# Patient Record
Sex: Female | Born: 1955
Health system: Southern US, Community
[De-identification: ages and names within clinical notes are randomized; demographics above are authoritative.]

## PROBLEM LIST (undated history)

## (undated) DIAGNOSIS — G4730 Sleep apnea, unspecified: Secondary | ICD-10-CM

## (undated) DIAGNOSIS — E079 Disorder of thyroid, unspecified: Secondary | ICD-10-CM

## (undated) DIAGNOSIS — E669 Obesity, unspecified: Secondary | ICD-10-CM

## (undated) DIAGNOSIS — I10 Essential (primary) hypertension: Secondary | ICD-10-CM

## (undated) DIAGNOSIS — E039 Hypothyroidism, unspecified: Secondary | ICD-10-CM

## (undated) DIAGNOSIS — F319 Bipolar disorder, unspecified: Secondary | ICD-10-CM

## (undated) DIAGNOSIS — M51369 Other intervertebral disc degeneration, lumbar region without mention of lumbar back pain or lower extremity pain: Secondary | ICD-10-CM

## (undated) DIAGNOSIS — R000 Tachycardia, unspecified: Secondary | ICD-10-CM

## (undated) DIAGNOSIS — I350 Nonrheumatic aortic (valve) stenosis: Secondary | ICD-10-CM

## (undated) DIAGNOSIS — R011 Cardiac murmur, unspecified: Secondary | ICD-10-CM

## (undated) DIAGNOSIS — M5136 Other intervertebral disc degeneration, lumbar region: Secondary | ICD-10-CM

## (undated) DIAGNOSIS — F338 Other recurrent depressive disorders: Secondary | ICD-10-CM

## (undated) DIAGNOSIS — E119 Type 2 diabetes mellitus without complications: Secondary | ICD-10-CM

## (undated) DIAGNOSIS — I639 Cerebral infarction, unspecified: Secondary | ICD-10-CM

## (undated) DIAGNOSIS — K259 Gastric ulcer, unspecified as acute or chronic, without hemorrhage or perforation: Secondary | ICD-10-CM

## (undated) DIAGNOSIS — F419 Anxiety disorder, unspecified: Secondary | ICD-10-CM

## (undated) HISTORY — DX: Type 2 diabetes mellitus without complications: E11.9

## (undated) HISTORY — PX: CARDIAC CATHETERIZATION: SHX172

## (undated) HISTORY — DX: Obesity, unspecified: E66.9

## (undated) HISTORY — DX: Gastric ulcer, unspecified as acute or chronic, without hemorrhage or perforation: K25.9

## (undated) HISTORY — DX: Disorder of thyroid, unspecified: E07.9

## (undated) HISTORY — DX: Other intervertebral disc degeneration, lumbar region: M51.36

## (undated) HISTORY — DX: Bipolar disorder, unspecified: F31.9

## (undated) HISTORY — DX: Other intervertebral disc degeneration, lumbar region without mention of lumbar back pain or lower extremity pain: M51.369

## (undated) HISTORY — DX: Sleep apnea, unspecified: G47.30

## (undated) HISTORY — PX: APPENDECTOMY: SHX54

## (undated) HISTORY — PX: PARTIAL GASTRECTOMY: SHX2172

## (undated) HISTORY — DX: Essential (primary) hypertension: I10

## (undated) HISTORY — DX: Other recurrent depressive disorders: F33.8

## (undated) HISTORY — PX: CHOLECYSTECTOMY: SHX55

## (undated) HISTORY — DX: Tachycardia, unspecified: R00.0

## (undated) HISTORY — DX: Nonrheumatic aortic (valve) stenosis: I35.0

## (undated) HISTORY — DX: Anxiety disorder, unspecified: F41.9

## (undated) HISTORY — PX: HERNIA REPAIR: SHX51

## (undated) SURGERY — EGD (ESOPHAGOGASTRODUODENOSCOPY)
Anesthesia: Monitor Anesthesia Care

---

## 2004-05-16 ENCOUNTER — Ambulatory Visit: Payer: Self-pay | Admitting: Unknown Physician Specialty

## 2005-08-21 ENCOUNTER — Ambulatory Visit: Payer: Self-pay

## 2007-02-24 ENCOUNTER — Ambulatory Visit: Payer: Self-pay | Admitting: Cardiovascular Disease

## 2007-03-05 ENCOUNTER — Ambulatory Visit: Payer: Self-pay | Admitting: Family Medicine

## 2008-03-30 ENCOUNTER — Ambulatory Visit: Payer: Self-pay | Admitting: Family Medicine

## 2009-01-17 ENCOUNTER — Ambulatory Visit: Payer: Self-pay | Admitting: General Surgery

## 2009-01-20 ENCOUNTER — Ambulatory Visit: Payer: Self-pay | Admitting: General Surgery

## 2009-04-12 ENCOUNTER — Ambulatory Visit: Payer: Self-pay | Admitting: Family Medicine

## 2010-05-17 ENCOUNTER — Ambulatory Visit: Payer: Self-pay | Admitting: Family Medicine

## 2010-10-09 ENCOUNTER — Ambulatory Visit: Payer: Self-pay | Admitting: General Surgery

## 2010-12-12 ENCOUNTER — Ambulatory Visit: Payer: Self-pay | Admitting: General Surgery

## 2010-12-18 ENCOUNTER — Ambulatory Visit: Payer: Self-pay | Admitting: Cardiovascular Disease

## 2010-12-20 ENCOUNTER — Ambulatory Visit: Payer: Self-pay | Admitting: General Surgery

## 2011-05-24 ENCOUNTER — Ambulatory Visit: Payer: Self-pay | Admitting: Family Medicine

## 2012-05-27 ENCOUNTER — Ambulatory Visit: Payer: Self-pay | Admitting: Family Medicine

## 2013-04-06 ENCOUNTER — Ambulatory Visit (INDEPENDENT_AMBULATORY_CARE_PROVIDER_SITE_OTHER): Payer: 59 | Admitting: Licensed Clinical Social Worker

## 2013-04-06 DIAGNOSIS — F331 Major depressive disorder, recurrent, moderate: Secondary | ICD-10-CM

## 2013-04-06 DIAGNOSIS — F411 Generalized anxiety disorder: Secondary | ICD-10-CM

## 2013-04-17 ENCOUNTER — Ambulatory Visit (INDEPENDENT_AMBULATORY_CARE_PROVIDER_SITE_OTHER): Payer: 59 | Admitting: Licensed Clinical Social Worker

## 2013-04-17 DIAGNOSIS — F3189 Other bipolar disorder: Secondary | ICD-10-CM

## 2013-04-24 ENCOUNTER — Ambulatory Visit (INDEPENDENT_AMBULATORY_CARE_PROVIDER_SITE_OTHER): Payer: 59 | Admitting: Licensed Clinical Social Worker

## 2013-04-24 DIAGNOSIS — F411 Generalized anxiety disorder: Secondary | ICD-10-CM

## 2013-04-24 DIAGNOSIS — F331 Major depressive disorder, recurrent, moderate: Secondary | ICD-10-CM

## 2013-05-04 ENCOUNTER — Ambulatory Visit (INDEPENDENT_AMBULATORY_CARE_PROVIDER_SITE_OTHER): Payer: 59 | Admitting: Licensed Clinical Social Worker

## 2013-05-04 DIAGNOSIS — F3189 Other bipolar disorder: Secondary | ICD-10-CM

## 2013-05-15 ENCOUNTER — Ambulatory Visit (INDEPENDENT_AMBULATORY_CARE_PROVIDER_SITE_OTHER): Payer: 59 | Admitting: Licensed Clinical Social Worker

## 2013-05-15 DIAGNOSIS — F3189 Other bipolar disorder: Secondary | ICD-10-CM

## 2013-05-29 ENCOUNTER — Ambulatory Visit: Payer: 59 | Admitting: Licensed Clinical Social Worker

## 2013-06-09 ENCOUNTER — Ambulatory Visit: Payer: Self-pay | Admitting: Family Medicine

## 2013-06-19 ENCOUNTER — Ambulatory Visit (INDEPENDENT_AMBULATORY_CARE_PROVIDER_SITE_OTHER): Payer: 59 | Admitting: Licensed Clinical Social Worker

## 2013-06-19 DIAGNOSIS — F3189 Other bipolar disorder: Secondary | ICD-10-CM

## 2013-07-02 ENCOUNTER — Ambulatory Visit: Payer: 59 | Admitting: Licensed Clinical Social Worker

## 2013-07-03 ENCOUNTER — Ambulatory Visit (INDEPENDENT_AMBULATORY_CARE_PROVIDER_SITE_OTHER): Payer: 59 | Admitting: Licensed Clinical Social Worker

## 2013-07-03 DIAGNOSIS — F3189 Other bipolar disorder: Secondary | ICD-10-CM

## 2013-07-08 ENCOUNTER — Encounter (HOSPITAL_COMMUNITY): Payer: Self-pay | Admitting: Psychiatry

## 2013-07-08 ENCOUNTER — Ambulatory Visit (INDEPENDENT_AMBULATORY_CARE_PROVIDER_SITE_OTHER): Payer: 59 | Admitting: Psychiatry

## 2013-07-08 ENCOUNTER — Encounter (INDEPENDENT_AMBULATORY_CARE_PROVIDER_SITE_OTHER): Payer: Self-pay

## 2013-07-08 VITALS — BP 154/100 | HR 76 | Ht 63.0 in | Wt 320.6 lb

## 2013-07-08 DIAGNOSIS — F3130 Bipolar disorder, current episode depressed, mild or moderate severity, unspecified: Secondary | ICD-10-CM

## 2013-07-08 DIAGNOSIS — F319 Bipolar disorder, unspecified: Secondary | ICD-10-CM
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
## 2013-07-17 ENCOUNTER — Ambulatory Visit (INDEPENDENT_AMBULATORY_CARE_PROVIDER_SITE_OTHER): Payer: 59 | Admitting: Licensed Clinical Social Worker

## 2013-07-17 DIAGNOSIS — F3189 Other bipolar disorder: Secondary | ICD-10-CM

## 2013-08-21 ENCOUNTER — Ambulatory Visit (INDEPENDENT_AMBULATORY_CARE_PROVIDER_SITE_OTHER): Payer: 59 | Admitting: Licensed Clinical Social Worker

## 2013-08-21 DIAGNOSIS — F3189 Other bipolar disorder: Secondary | ICD-10-CM

## 2013-09-04 ENCOUNTER — Ambulatory Visit (INDEPENDENT_AMBULATORY_CARE_PROVIDER_SITE_OTHER): Payer: 59 | Admitting: Licensed Clinical Social Worker

## 2013-09-04 DIAGNOSIS — F3189 Other bipolar disorder: Secondary | ICD-10-CM

## 2013-09-30 ENCOUNTER — Ambulatory Visit (INDEPENDENT_AMBULATORY_CARE_PROVIDER_SITE_OTHER): Payer: Self-pay | Admitting: Licensed Clinical Social Worker

## 2013-09-30 DIAGNOSIS — F3189 Other bipolar disorder: Secondary | ICD-10-CM

## 2013-10-02 ENCOUNTER — Ambulatory Visit (INDEPENDENT_AMBULATORY_CARE_PROVIDER_SITE_OTHER): Payer: 59 | Admitting: Licensed Clinical Social Worker

## 2013-10-02 DIAGNOSIS — F3189 Other bipolar disorder: Secondary | ICD-10-CM

## 2013-10-23 ENCOUNTER — Ambulatory Visit (INDEPENDENT_AMBULATORY_CARE_PROVIDER_SITE_OTHER): Payer: 59 | Admitting: Licensed Clinical Social Worker

## 2013-10-23 DIAGNOSIS — F3189 Other bipolar disorder: Secondary | ICD-10-CM

## 2013-12-13 ENCOUNTER — Emergency Department: Payer: Self-pay | Admitting: Emergency Medicine

## 2013-12-13 LAB — CBC WITH DIFFERENTIAL/PLATELET
Basophil #: 0.1 10*3/uL (ref 0.0–0.1)
Basophil %: 0.7 %
Eosinophil #: 0 10*3/uL (ref 0.0–0.7)
Eosinophil %: 0.2 %
HCT: 40.8 % (ref 35.0–47.0)
HGB: 13.5 g/dL (ref 12.0–16.0)
Lymphocyte #: 1.5 10*3/uL (ref 1.0–3.6)
Lymphocyte %: 9.7 %
MCH: 28.9 pg (ref 26.0–34.0)
MCHC: 33.1 g/dL (ref 32.0–36.0)
MCV: 88 fL (ref 80–100)
MONO ABS: 1.4 x10 3/mm — AB (ref 0.2–0.9)
Monocyte %: 9.1 %
NEUTROS ABS: 12.3 10*3/uL — AB (ref 1.4–6.5)
NEUTROS PCT: 80.3 %
PLATELETS: 297 10*3/uL (ref 150–440)
RBC: 4.67 10*6/uL (ref 3.80–5.20)
RDW: 13.7 % (ref 11.5–14.5)
WBC: 15.3 10*3/uL — AB (ref 3.6–11.0)

## 2013-12-13 LAB — URINALYSIS, COMPLETE
Blood: NEGATIVE
GLUCOSE, UR: NEGATIVE mg/dL (ref 0–75)
Ketone: NEGATIVE
Nitrite: NEGATIVE
PH: 5 (ref 4.5–8.0)
RBC,UR: 16 /HPF (ref 0–5)
Specific Gravity: 1.03 (ref 1.003–1.030)
WBC UR: 54 /HPF (ref 0–5)

## 2013-12-13 LAB — BASIC METABOLIC PANEL
ANION GAP: 14 (ref 7–16)
BUN: 28 mg/dL — ABNORMAL HIGH (ref 7–18)
CALCIUM: 9.4 mg/dL (ref 8.5–10.1)
CHLORIDE: 100 mmol/L (ref 98–107)
CREATININE: 2.01 mg/dL — AB (ref 0.60–1.30)
Co2: 21 mmol/L (ref 21–32)
EGFR (Non-African Amer.): 27 — ABNORMAL LOW
GFR CALC AF AMER: 31 — AB
GLUCOSE: 286 mg/dL — AB (ref 65–99)
Osmolality: 286 (ref 275–301)
Potassium: 3.9 mmol/L (ref 3.5–5.1)
SODIUM: 135 mmol/L — AB (ref 136–145)

## 2013-12-13 LAB — RAPID INFLUENZA A&B ANTIGENS

## 2013-12-13 LAB — TROPONIN I

## 2014-02-08 ENCOUNTER — Ambulatory Visit: Payer: Self-pay | Admitting: Internal Medicine

## 2014-02-23 ENCOUNTER — Ambulatory Visit: Payer: Self-pay | Admitting: Internal Medicine

## 2014-04-05 ENCOUNTER — Ambulatory Visit: Payer: Self-pay | Admitting: Pain Medicine

## 2014-04-19 ENCOUNTER — Ambulatory Visit: Payer: Self-pay | Admitting: Pain Medicine

## 2014-04-20 ENCOUNTER — Ambulatory Visit: Payer: Self-pay | Admitting: Pain Medicine

## 2014-05-18 ENCOUNTER — Ambulatory Visit: Payer: Self-pay | Admitting: Pain Medicine

## 2014-05-31 ENCOUNTER — Ambulatory Visit: Payer: Self-pay | Admitting: Pain Medicine

## 2014-06-14 ENCOUNTER — Ambulatory Visit: Payer: Self-pay | Admitting: Internal Medicine

## 2014-06-29 ENCOUNTER — Ambulatory Visit
Admit: 2014-06-29 | Disposition: A | Discharge status: Home / Self Care | Payer: Self-pay | Attending: Pain Medicine | Admitting: Pain Medicine

## 2014-07-05 ENCOUNTER — Ambulatory Visit
Admit: 2014-07-05 | Disposition: A | Discharge status: Home / Self Care | Payer: Self-pay | Attending: Pain Medicine | Admitting: Pain Medicine

## 2014-07-14 DIAGNOSIS — R4586 Emotional lability: Secondary | ICD-10-CM | POA: Insufficient documentation

## 2014-07-14 DIAGNOSIS — F338 Other recurrent depressive disorders: Secondary | ICD-10-CM | POA: Insufficient documentation

## 2014-08-03 ENCOUNTER — Ambulatory Visit: Payer: Self-pay | Admitting: Pain Medicine

## 2014-08-29 ENCOUNTER — Other Ambulatory Visit: Payer: Self-pay | Admitting: Psychiatry

## 2014-08-29 ENCOUNTER — Other Ambulatory Visit: Payer: Self-pay | Admitting: Pain Medicine

## 2014-08-29 DIAGNOSIS — M47816 Spondylosis without myelopathy or radiculopathy, lumbar region: Secondary | ICD-10-CM | POA: Insufficient documentation

## 2014-08-29 DIAGNOSIS — M51369 Other intervertebral disc degeneration, lumbar region without mention of lumbar back pain or lower extremity pain: Secondary | ICD-10-CM | POA: Insufficient documentation

## 2014-08-29 DIAGNOSIS — Z9889 Other specified postprocedural states: Secondary | ICD-10-CM | POA: Insufficient documentation

## 2014-08-29 DIAGNOSIS — M533 Sacrococcygeal disorders, not elsewhere classified: Secondary | ICD-10-CM

## 2014-08-29 DIAGNOSIS — F3340 Major depressive disorder, recurrent, in remission, unspecified: Secondary | ICD-10-CM

## 2014-08-29 DIAGNOSIS — M5136 Other intervertebral disc degeneration, lumbar region: Secondary | ICD-10-CM

## 2014-08-30 NOTE — Telephone Encounter (Signed)
Received automatic rotation patient's pharmacy that she is due for refills. Per records patient would be due for refills around July 2016. Writer is gone ahead and renewed her mail order prescriptions for lamotrigine, venlafaxine and appropriate on XL. Provided 90 day supply of these medications with no refills. Patient will be seen in scheduled appointment for 09/03/2014.

## 2014-08-31 ENCOUNTER — Encounter: Payer: Self-pay | Admitting: Pain Medicine

## 2014-08-31 ENCOUNTER — Ambulatory Visit: Payer: PPO | Attending: Pain Medicine | Admitting: Pain Medicine

## 2014-08-31 VITALS — BP 134/64 | HR 67 | Temp 98.3°F | Resp 16 | Ht 62.0 in | Wt 310.0 lb

## 2014-08-31 DIAGNOSIS — M7060 Trochanteric bursitis, unspecified hip: Secondary | ICD-10-CM | POA: Insufficient documentation

## 2014-08-31 DIAGNOSIS — M533 Sacrococcygeal disorders, not elsewhere classified: Secondary | ICD-10-CM | POA: Diagnosis not present

## 2014-08-31 DIAGNOSIS — M5136 Other intervertebral disc degeneration, lumbar region: Secondary | ICD-10-CM | POA: Diagnosis not present

## 2014-08-31 DIAGNOSIS — Z9889 Other specified postprocedural states: Secondary | ICD-10-CM

## 2014-08-31 DIAGNOSIS — M47816 Spondylosis without myelopathy or radiculopathy, lumbar region: Secondary | ICD-10-CM

## 2014-08-31 DIAGNOSIS — M545 Low back pain: Secondary | ICD-10-CM | POA: Diagnosis present

## 2014-08-31 MED ORDER — TRAMADOL HCL 50 MG PO TABS: ORAL_TABLET | ORAL | Status: DC

## 2014-08-31 NOTE — Progress Notes (Signed)
   Subjective:    Patient ID: Barbara Spencer, female    DOB: [birth year], 59 y.o.   MRN: [id number]  HPI    Review of Systems     Objective:   Physical Exam        Assessment & Plan:  Discharge patient home accompanied with husband Script given for tramadol Return in one month

## 2014-08-31 NOTE — Patient Instructions (Addendum)
Continue present medications.  Call before appointment if your pain returns and we will consider procedure are other changes in her treatment  F/U PCP for evaliation of  BP and general medical  condition.  F/U surgical evaluation.  F/U neurological evaluation.  May consider radiofrequency rhizolysis or intraspinal procedures pending response to present treatment and F/U evaluation.  Patient to call [facility] should patient have concerns prior to scheduled return appointment.

## 2014-08-31 NOTE — Progress Notes (Signed)
   Subjective:    Patient ID: Barbara Spencer, female    DOB: [birth year], 59 y.o.   MRN: [id number]  HPI  Patient is 59 year old female returns to [place] for further evaluation and treatment of pain involving the lower back lower extremity region. Patient has significant improvement of pain with block of nerves to the sacroiliac joint. Patient has been able to perform all activities of daily living due to the relief of pain following the procedure block of nerves to the sacroiliac joint. At the present time we will avoid additional interventional treatment and will continue medications as discussed and explained to patient on today's visit. The patient was understanding and agreement status treatment plan.        Review of Systems     Objective:   Physical Exam  Palpation of the trapezius musculature region was a tends to palpation of mild degree with mild tenderness of the splenius capitis and occipitalis musculature regions. Palpation of the acromioclavicular and glenohumeral joint region reproduced mild discomfort and patient appeared to be with bilaterally equal grip strength. Tinel and Phalen's maneuver without increased pain of any significant degree. Palpation of the thoracic facet thoracic paraspinal musculature region was a tends to palpation of mild degree with tinged palpation of the lower thoracic region reproducing moderate discomfort. Lateral bending and rotation extension and palpation over the lumbar facets reproduce moderate to moderately severe discomfort. With moderate discomfort over the PSIS PII S regions and gluteal and piriformis musculature regions. There was mild tenderness of the greater trochanteric region iliotibial band region leg raising tolerates approximately 20 without increase of pain with dorsiflexion noted. There was negative clonus negative Homans. Abdomen nontender and no costovertebral angle tenderness noted      Assessment & Plan:  DDD  lumbar spine L2-3, L3-4, L4-5, and L5-S1 degenerative changes Degenerative disc disease with mild impingement L5-S1 and borderline impingement L3 for  Lumbar facet syndrome  Sacroiliac joint dysfunction  Greater trochanteric bursitis    Plan    Continue present medications.  F/U PCP for evaliation of  BP and general medical  condition. Tramadol  F/U surgical evaluation.  F/U neurological evaluation.  May consider radiofrequency rhizolysis or intraspinal procedures pending response to present treatment and F/U evaluation.  Patient to call [facility] should patient have concerns prior to scheduled return appointment.

## 2014-08-31 NOTE — Progress Notes (Signed)
Safety precautions to be maintained throughout the outpatient stay will include: orient to surroundings, keep bed in low position, maintain call bell within reach at all times, provide assistance with transfer out of bed and ambulation.  

## 2014-09-03 ENCOUNTER — Ambulatory Visit (INDEPENDENT_AMBULATORY_CARE_PROVIDER_SITE_OTHER): Payer: PPO | Admitting: Psychiatry

## 2014-09-03 ENCOUNTER — Encounter: Payer: Self-pay | Admitting: Psychiatry

## 2014-09-03 VITALS — BP 128/88 | HR 73 | Temp 97.9°F | Ht 62.0 in

## 2014-09-03 DIAGNOSIS — F338 Other recurrent depressive disorders: Secondary | ICD-10-CM

## 2014-09-03 DIAGNOSIS — F39 Unspecified mood [affective] disorder: Secondary | ICD-10-CM

## 2014-09-03 MED ORDER — QUETIAPINE FUMARATE 25 MG PO TABS: 25.0000 mg | ORAL_TABLET | Freq: Two times a day (BID) | ORAL | Status: DC

## 2014-09-03 NOTE — Addendum Note (Signed)
Addended by: Marjie Skiff on: 09/03/2014 11:15 AM   Modules accepted: Orders
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
## 2014-09-09 ENCOUNTER — Other Ambulatory Visit: Payer: Self-pay | Admitting: Pain Medicine

## 2014-09-10 ENCOUNTER — Telehealth: Payer: Self-pay | Admitting: Psychiatry

## 2014-09-10 NOTE — Telephone Encounter (Signed)
Patient had a lipid panel and glucose done on 09/07/2014. All patient informed her that she had elevated levels of her cholesterol and glucose. Her total cholesterol was 232, triglycerides are 305, HDL was 39. Her LDL cholesterol was elevated at 132. Her glucose was elevated at 167. I informed her that she should talk with her primary care physician. She states she has an appointment with primary care physician in July 2016. She is artery being treated for cholesterol and diabetes.

## 2014-09-28 ENCOUNTER — Encounter: Payer: Self-pay | Admitting: Pain Medicine

## 2014-09-28 ENCOUNTER — Ambulatory Visit: Payer: PPO | Attending: Pain Medicine | Admitting: Pain Medicine

## 2014-09-28 VITALS — BP 154/71 | HR 67 | Temp 97.6°F | Resp 18 | Ht 62.0 in | Wt 310.0 lb

## 2014-09-28 DIAGNOSIS — M545 Low back pain: Secondary | ICD-10-CM | POA: Diagnosis present

## 2014-09-28 DIAGNOSIS — M47816 Spondylosis without myelopathy or radiculopathy, lumbar region: Secondary | ICD-10-CM | POA: Diagnosis not present

## 2014-09-28 DIAGNOSIS — M5481 Occipital neuralgia: Secondary | ICD-10-CM | POA: Insufficient documentation

## 2014-09-28 DIAGNOSIS — M7060 Trochanteric bursitis, unspecified hip: Secondary | ICD-10-CM | POA: Insufficient documentation

## 2014-09-28 DIAGNOSIS — M5136 Other intervertebral disc degeneration, lumbar region: Secondary | ICD-10-CM | POA: Insufficient documentation

## 2014-09-28 DIAGNOSIS — M533 Sacrococcygeal disorders, not elsewhere classified: Secondary | ICD-10-CM | POA: Diagnosis not present

## 2014-09-28 DIAGNOSIS — M79604 Pain in right leg: Secondary | ICD-10-CM | POA: Diagnosis present

## 2014-09-28 DIAGNOSIS — M1288 Other specific arthropathies, not elsewhere classified, other specified site: Secondary | ICD-10-CM | POA: Diagnosis not present

## 2014-09-28 DIAGNOSIS — Z9889 Other specified postprocedural states: Secondary | ICD-10-CM

## 2014-09-28 DIAGNOSIS — M79605 Pain in left leg: Secondary | ICD-10-CM | POA: Diagnosis present

## 2014-09-28 MED ORDER — TRAMADOL HCL 50 MG PO TABS: ORAL_TABLET | ORAL | Status: DC

## 2014-09-28 NOTE — Patient Instructions (Addendum)
Continue present medications Ultram  F/U PCP for evaliation of  BP and general medical  condition.  F/U surgical evaluation  F/U neurological evaluation  May consider radiofrequency rhizolysis or intraspinal procedures pending response to present treatment and F/U evaluation.  Patient to call [facility] should patient have concerns prior to scheduled return appointment.

## 2014-09-28 NOTE — Progress Notes (Signed)
   Subjective:    Patient ID: Barbara Spencer, female    DOB: 15-Dec-1955, 59 y.o.   MRN: [id number]  HPI  Patient is 59 year old female returns to [facility] for further evaluation and treatment of pain involving the lower back and lower extremity regions. The patient states that the pain is fairly well-controlled at this time. Patient is tolerating tramadol without any undesirable side effects. We discussed interventional treatment and will avoid additional interventional treatment at this time. We will continue the present medication tramadol at this time and patient is to call [facility] should there be change in condition prior to scheduled return appointment. The patient is understanding and agrees with suggested treatment plan    Review of Systems     Objective:   Physical Exam   There was tenderness over the splenius capitis and occipitalis musculature regions of minimal degree. Palpation over the cervical facet reproduced mild discomfort and palpation over the cervical paraspinal musculature region reproduced mild discomfort left greater than the right. There was tenderness over the thoracic facet thoracic paraspinal musculature region of mild to moderate degree. No crepitus of the thoracic region was noted. Palpation over the acromioclavicular and glenohumeral joint regions was without increased pain of significant degree. Nell and Phalen's maneuver were without increase of pain of significant degree. Palpation over the lumbar facet lumbar paraspinal muscles reproduced pain of moderate degree. Lateral bending and rotation and extension and palpation of the lumbar facets reproduce moderate discomfort. There was moderate increased pain with palpation over the PSIS and PII S regions. Patient over the greater trochanteric region iliotibial band region was with mild tends to palpation. Tolerates approximately 20 with no increase of pain with dorsiflexion noted. There was  negative clonus and negative Homans. No definite sensory deficit of dermatomal distribution was detected. Abdomen was protuberant with no excessive tends to palpation and no costovertebral angle tenderness was noted.      Assessment & Plan:     Degenerative disc disease lumbar spine Lumbar spondylosis mild impingement L5-S1 borderline impingement L3-4 facet arthropathy noted throughout the lumbar spine L2-3, L3-4, L4-5, and L5-S1  Lumbar facet syndrome  Sacroiliac joint dysfunction  Trochanteric bursitis  Bilateral occipital neuralgia   Plan   Continue present medications Ultram   F/U PCP Dr.N Humphrey Rolls  for evaliation of  BP and general medical  condition.  F/U surgical evaluation  F/U neurological evaluation  May consider radiofrequency rhizolysis or intraspinal procedures pending response to present treatment and F/U evaluation.  Patient to call [facility] should patient have concerns prior to scheduled return appointment.

## 2014-10-11 ENCOUNTER — Encounter: Payer: Self-pay | Admitting: Psychiatry

## 2014-10-11 ENCOUNTER — Ambulatory Visit (INDEPENDENT_AMBULATORY_CARE_PROVIDER_SITE_OTHER): Payer: PPO | Admitting: Psychiatry

## 2014-10-11 VITALS — BP 122/88 | HR 84 | Temp 97.6°F | Ht 62.0 in | Wt 325.8 lb

## 2014-10-11 DIAGNOSIS — F3340 Major depressive disorder, recurrent, in remission, unspecified: Secondary | ICD-10-CM

## 2014-10-11 DIAGNOSIS — F39 Unspecified mood [affective] disorder: Secondary | ICD-10-CM | POA: Diagnosis not present

## 2014-10-11 MED ORDER — BUPROPION HCL ER (XL) 300 MG PO TB24: 300.0000 mg | ORAL_TABLET | Freq: Every day | ORAL | Status: DC

## 2014-10-11 MED ORDER — LAMOTRIGINE 25 MG PO TABS: ORAL_TABLET | ORAL | Status: DC

## 2014-10-11 MED ORDER — VENLAFAXINE HCL ER 225 MG PO TB24: 225.0000 mg | ORAL_TABLET | Freq: Every day | ORAL | Status: DC

## 2014-10-11 MED ORDER — HALOPERIDOL 2 MG PO TABS: 2.0000 mg | ORAL_TABLET | Freq: Two times a day (BID) | ORAL | Status: DC

## 2014-10-11 MED ORDER — CLONAZEPAM 0.5 MG PO TABS: 0.5000 mg | ORAL_TABLET | Freq: Two times a day (BID) | ORAL | Status: DC
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
## 2014-10-15 MED ORDER — HALOPERIDOL 2 MG PO TABS: 2.0000 mg | ORAL_TABLET | Freq: Two times a day (BID) | ORAL | Status: DC

## 2014-10-15 NOTE — Addendum Note (Signed)
Addended by: Marjie Skiff on: 10/15/2014 03:20 PM   Modules accepted: Orders

## 2014-10-26 ENCOUNTER — Ambulatory Visit: Payer: PPO | Attending: Pain Medicine | Admitting: Pain Medicine

## 2014-10-26 ENCOUNTER — Encounter: Payer: Self-pay | Admitting: Pain Medicine

## 2014-10-26 VITALS — BP 130/69 | HR 71 | Temp 97.5°F | Resp 20 | Ht 62.0 in | Wt 311.0 lb

## 2014-10-26 DIAGNOSIS — M7060 Trochanteric bursitis, unspecified hip: Secondary | ICD-10-CM | POA: Diagnosis not present

## 2014-10-26 DIAGNOSIS — Z9889 Other specified postprocedural states: Secondary | ICD-10-CM

## 2014-10-26 DIAGNOSIS — M1288 Other specific arthropathies, not elsewhere classified, other specified site: Secondary | ICD-10-CM | POA: Diagnosis not present

## 2014-10-26 DIAGNOSIS — M5136 Other intervertebral disc degeneration, lumbar region: Secondary | ICD-10-CM

## 2014-10-26 DIAGNOSIS — M545 Low back pain: Secondary | ICD-10-CM | POA: Diagnosis present

## 2014-10-26 DIAGNOSIS — M47816 Spondylosis without myelopathy or radiculopathy, lumbar region: Secondary | ICD-10-CM

## 2014-10-26 DIAGNOSIS — M5481 Occipital neuralgia: Secondary | ICD-10-CM | POA: Insufficient documentation

## 2014-10-26 DIAGNOSIS — M533 Sacrococcygeal disorders, not elsewhere classified: Secondary | ICD-10-CM

## 2014-10-26 DIAGNOSIS — M79604 Pain in right leg: Secondary | ICD-10-CM | POA: Diagnosis present

## 2014-10-26 DIAGNOSIS — M79605 Pain in left leg: Secondary | ICD-10-CM | POA: Diagnosis present

## 2014-10-26 MED ORDER — TRAMADOL HCL 50 MG PO TABS: ORAL_TABLET | ORAL | Status: DC

## 2014-10-26 NOTE — Progress Notes (Signed)
   Subjective:    Patient ID: Barbara Spencer, female    DOB: 1955/10/17, 59 y.o.   MRN: [id number]  HPI  Patient is 58 year old female returns to [place] for further evaluation and treatment of pain involving the lower back lower extremity region. Patient stated that the lower back lower extremity pain increases with standing and walking and that we prolonged standing and walking also precipitated weakness and numbness of the lower extremities. We discussed interventional treatment including epidural steroid injections which patient wished to delay at this time due to finances. We discussed other treatment at the present time we will continue patient's medications and remain available to proceed with further modifications of treatment as discussed. We will also discussed surgical evaluation. Patient denies any trauma change in events of daily living the call significant change in symptomatology.    Review of Systems     Objective:   Physical Exam  There was tenderness to palpation of the splenius capitis and occipitalis musculature region of mild degree. There was mild tenderness over the cervical facet cervical paraspinal musculature region as well as the thoracic facet thoracic paraspinal musculature region. There appeared to be unremarkable Spurling's maneuver and patient was with bilaterally equal grip strength. Tinel and Phalen's maneuver were without increase of pain of significant degree. Outpatient over the lower thoracic paraspinal musculature region thoracic facet region was with evidence of moderate muscle spasms. Palpation over the lumbar paraspinal muscles lumbar facet region was with moderate muscle spasms. Palpation over the PSIS and PII S region reproduces moderate discomfort. Straight leg raising limited to approximately 20 without increased pain with dorsiflexion noted. Lateral bending and rotation and extension to palpation of the lumbar facets reproduce moderate  to moderately severe discomfort. EHL strength appeared to be decreased with no sensory deficit of dermatomal distribution detected. There was negative clonus negative Homans.      Assessment & Plan:    Degenerative disc disease lumbar spine Lumbar spondylosis mild impingement L5-S1 borderline impingement L3-4 facet arthropathy noted throughout the lumbar spine L2-3, L3-4, L4-5, and L5-S1  Lumbar facet syndrome  Sacroiliac joint dysfunction  Trochanteric bursitis  Bilateral occipital neuralgia    Plan   Continue present medications Ultram    Lumbar/caudal epidural steroid injection was discussed on today's visit. The patient may consider a caudal epidural steroid injection. At the present time patient wishes to delay caudal epidural steroid injection due to finances  F/U PCP Dr.N Humphrey Rolls  for evaliation of  BP and general medical  condition.  F/U surgical evaluation  F/U neurological evaluation  May consider radiofrequency rhizolysis or intraspinal procedures pending response to present treatment and F/U evaluation.  Patient to call [facility] should patient have concerns prior to scheduled return appointment.

## 2014-10-26 NOTE — Patient Instructions (Addendum)
Continue present medication tramadol as discussed  F/U PCP Dr.N Humphrey Rolls  for evaliation of  BP and general medical  condition  F/U surgical evaluation. We will consider reevaluation as discussed  F/U neurological evaluation  May consider radiofrequency rhizolysis or intraspinal procedures pending response to present treatment and F/U evaluation   Patient to call [facility] should patient have concerns prior to scheduled return appointmen.

## 2014-10-26 NOTE — Progress Notes (Signed)
Safety precautions to be maintained throughout the outpatient stay will include: orient to surroundings, keep bed in low position, maintain call bell within reach at all times, provide assistance with transfer out of bed and ambulation.  

## 2014-11-12 ENCOUNTER — Ambulatory Visit (INDEPENDENT_AMBULATORY_CARE_PROVIDER_SITE_OTHER): Payer: PPO | Admitting: Psychiatry

## 2014-11-12 ENCOUNTER — Encounter: Payer: Self-pay | Admitting: Psychiatry

## 2014-11-12 VITALS — BP 122/84 | HR 70 | Temp 97.2°F | Ht 62.0 in | Wt 326.0 lb

## 2014-11-12 DIAGNOSIS — F39 Unspecified mood [affective] disorder: Secondary | ICD-10-CM

## 2014-11-12 DIAGNOSIS — F3340 Major depressive disorder, recurrent, in remission, unspecified: Secondary | ICD-10-CM

## 2014-11-12 MED ORDER — HALOPERIDOL 2 MG PO TABS: 2.0000 mg | ORAL_TABLET | Freq: Two times a day (BID) | ORAL | Status: DC

## 2014-11-12 MED ORDER — LAMOTRIGINE 25 MG PO TABS: ORAL_TABLET | ORAL | Status: DC
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
## 2014-11-25 ENCOUNTER — Ambulatory Visit: Payer: PPO | Admitting: Pain Medicine

## 2015-01-12 ENCOUNTER — Ambulatory Visit (INDEPENDENT_AMBULATORY_CARE_PROVIDER_SITE_OTHER): Payer: PPO | Admitting: Psychiatry

## 2015-01-12 ENCOUNTER — Encounter: Payer: Self-pay | Admitting: Psychiatry

## 2015-01-12 VITALS — BP 138/98 | HR 70 | Temp 97.6°F | Ht 62.0 in | Wt 324.6 lb

## 2015-01-12 DIAGNOSIS — F39 Unspecified mood [affective] disorder: Secondary | ICD-10-CM | POA: Diagnosis not present

## 2015-01-12 DIAGNOSIS — F3340 Major depressive disorder, recurrent, in remission, unspecified: Secondary | ICD-10-CM

## 2015-01-12 MED ORDER — VENLAFAXINE HCL ER 225 MG PO TB24: 225.0000 mg | ORAL_TABLET | Freq: Every day | ORAL | Status: DC

## 2015-01-12 MED ORDER — HALOPERIDOL 2 MG PO TABS: 2.0000 mg | ORAL_TABLET | Freq: Two times a day (BID) | ORAL | Status: DC

## 2015-01-12 MED ORDER — CLONAZEPAM 0.5 MG PO TABS: 0.5000 mg | ORAL_TABLET | Freq: Two times a day (BID) | ORAL | Status: DC

## 2015-01-12 MED ORDER — BUPROPION HCL ER (XL) 450 MG PO TB24: 450.0000 mg | ORAL_TABLET | Freq: Every day | ORAL | Status: DC

## 2015-01-12 MED ORDER — LAMOTRIGINE 25 MG PO TABS: ORAL_TABLET | ORAL | Status: DC
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
## 2015-01-19 NOTE — Progress Notes (Signed)
All were refilled except for the bupropion which was increased - dosage changed

## 2015-01-28 ENCOUNTER — Encounter (HOSPITAL_COMMUNITY): Payer: Self-pay | Admitting: *Deleted

## 2015-02-02 ENCOUNTER — Other Ambulatory Visit: Payer: Self-pay | Admitting: Gastroenterology

## 2015-02-03 ENCOUNTER — Ambulatory Visit (HOSPITAL_COMMUNITY): Payer: PPO | Admitting: Certified Registered Nurse Anesthetist

## 2015-02-03 ENCOUNTER — Ambulatory Visit (HOSPITAL_COMMUNITY)
Admission: RE | Admit: 2015-02-03 | Discharge: 2015-02-03 | Disposition: A | Discharge status: Home / Self Care | Payer: PPO | Source: Ambulatory Visit | Attending: Gastroenterology | Admitting: Gastroenterology

## 2015-02-03 ENCOUNTER — Encounter (HOSPITAL_COMMUNITY): Payer: Self-pay | Admitting: Certified Registered Nurse Anesthetist

## 2015-02-03 ENCOUNTER — Encounter (HOSPITAL_COMMUNITY)
Admission: RE | Disposition: A | Discharge status: Home / Self Care | Payer: Self-pay | Source: Ambulatory Visit | Attending: Gastroenterology

## 2015-02-03 DIAGNOSIS — Z6841 Body Mass Index (BMI) 40.0 and over, adult: Secondary | ICD-10-CM | POA: Insufficient documentation

## 2015-02-03 DIAGNOSIS — E119 Type 2 diabetes mellitus without complications: Secondary | ICD-10-CM | POA: Insufficient documentation

## 2015-02-03 DIAGNOSIS — Z79899 Other long term (current) drug therapy: Secondary | ICD-10-CM | POA: Insufficient documentation

## 2015-02-03 DIAGNOSIS — F39 Unspecified mood [affective] disorder: Secondary | ICD-10-CM | POA: Insufficient documentation

## 2015-02-03 DIAGNOSIS — Z1211 Encounter for screening for malignant neoplasm of colon: Secondary | ICD-10-CM | POA: Insufficient documentation

## 2015-02-03 DIAGNOSIS — Z87891 Personal history of nicotine dependence: Secondary | ICD-10-CM | POA: Diagnosis not present

## 2015-02-03 DIAGNOSIS — F419 Anxiety disorder, unspecified: Secondary | ICD-10-CM | POA: Diagnosis not present

## 2015-02-03 DIAGNOSIS — D122 Benign neoplasm of ascending colon: Secondary | ICD-10-CM | POA: Diagnosis not present

## 2015-02-03 DIAGNOSIS — Z794 Long term (current) use of insulin: Secondary | ICD-10-CM | POA: Insufficient documentation

## 2015-02-03 DIAGNOSIS — D123 Benign neoplasm of transverse colon: Secondary | ICD-10-CM | POA: Insufficient documentation

## 2015-02-03 DIAGNOSIS — Z791 Long term (current) use of non-steroidal anti-inflammatories (NSAID): Secondary | ICD-10-CM | POA: Insufficient documentation

## 2015-02-03 DIAGNOSIS — G43909 Migraine, unspecified, not intractable, without status migrainosus: Secondary | ICD-10-CM | POA: Insufficient documentation

## 2015-02-03 DIAGNOSIS — M5136 Other intervertebral disc degeneration, lumbar region: Secondary | ICD-10-CM | POA: Insufficient documentation

## 2015-02-03 DIAGNOSIS — E079 Disorder of thyroid, unspecified: Secondary | ICD-10-CM | POA: Diagnosis not present

## 2015-02-03 DIAGNOSIS — I350 Nonrheumatic aortic (valve) stenosis: Secondary | ICD-10-CM | POA: Insufficient documentation

## 2015-02-03 DIAGNOSIS — Z8371 Family history of colonic polyps: Secondary | ICD-10-CM | POA: Insufficient documentation

## 2015-02-03 DIAGNOSIS — I10 Essential (primary) hypertension: Secondary | ICD-10-CM | POA: Diagnosis not present

## 2015-02-03 DIAGNOSIS — D124 Benign neoplasm of descending colon: Secondary | ICD-10-CM | POA: Insufficient documentation

## 2015-02-03 DIAGNOSIS — G4730 Sleep apnea, unspecified: Secondary | ICD-10-CM | POA: Insufficient documentation

## 2015-02-03 HISTORY — DX: Cardiac murmur, unspecified: R01.1

## 2015-02-03 HISTORY — PX: COLONOSCOPY WITH PROPOFOL: SHX5780

## 2015-02-03 SURGERY — COLONOSCOPY WITH PROPOFOL
Anesthesia: Monitor Anesthesia Care

## 2015-02-03 MED ORDER — SPOT INK MARKER SYRINGE KIT
PACK | SUBMUCOSAL | Status: AC
  Filled 2015-02-03: qty 5

## 2015-02-03 MED ORDER — KETAMINE HCL 10 MG/ML IJ SOLN
INTRAMUSCULAR | Status: AC
  Filled 2015-02-03: qty 1

## 2015-02-03 MED ORDER — LIDOCAINE HCL (CARDIAC) 20 MG/ML IV SOLN
INTRAVENOUS | Status: AC
  Filled 2015-02-03: qty 5

## 2015-02-03 MED ORDER — MIDAZOLAM HCL 2 MG/2ML IJ SOLN
INTRAMUSCULAR | Status: AC
  Filled 2015-02-03: qty 4

## 2015-02-03 MED ORDER — PROPOFOL 500 MG/50ML IV EMUL
INTRAVENOUS | Status: DC | PRN
  Administered 2015-02-03: 25 ug/kg/min via INTRAVENOUS

## 2015-02-03 MED ORDER — KETAMINE HCL 10 MG/ML IJ SOLN
INTRAMUSCULAR | Status: DC | PRN
  Administered 2015-02-03 (×3): 30 mg via INTRAVENOUS
  Administered 2015-02-03 (×3): 20 mg via INTRAVENOUS

## 2015-02-03 MED ORDER — LIDOCAINE HCL (CARDIAC) 20 MG/ML IV SOLN
INTRAVENOUS | Status: DC | PRN
  Administered 2015-02-03: 50 mg via INTRAVENOUS

## 2015-02-03 MED ORDER — PROPOFOL 10 MG/ML IV BOLUS
INTRAVENOUS | Status: AC
  Filled 2015-02-03: qty 20

## 2015-02-03 MED ORDER — LACTATED RINGERS IV SOLN
INTRAVENOUS | Status: DC
  Administered 2015-02-03: 08:00:00 via INTRAVENOUS

## 2015-02-03 MED ORDER — PROPOFOL 10 MG/ML IV BOLUS
INTRAVENOUS | Status: DC | PRN
  Administered 2015-02-03 (×2): 20 mg via INTRAVENOUS

## 2015-02-03 MED ORDER — SODIUM CHLORIDE 0.9 % IV SOLN: INTRAVENOUS | Status: DC

## 2015-02-03 MED ORDER — SPOT INK MARKER SYRINGE KIT
PACK | SUBMUCOSAL | Status: DC | PRN
  Administered 2015-02-03: 1 mL via SUBMUCOSAL

## 2015-02-03 SURGICAL SUPPLY — 21 items

## 2015-02-03 NOTE — Anesthesia Postprocedure Evaluation (Signed)
  Anesthesia Post-op Note  Patient: Barbara Spencer  Procedure(s) Performed: Procedure(s): COLONOSCOPY WITH PROPOFOL (N/A)  Patient Location: Endoscopy Unit  Anesthesia Type:MAC  Level of Consciousness: awake  Airway and Oxygen Therapy: Patient Spontanous Breathing  Post-op Pain: none  Post-op Assessment: Post-op Vital signs reviewed, Patient's Cardiovascular Status Stable, Respiratory Function Stable, Patent Airway, No signs of Nausea or vomiting and Pain level controlled              Post-op Vital Signs: Reviewed and stable  Last Vitals:  Filed Vitals:   02/03/15 0955  BP:   Pulse: 76  Temp:   Resp:     Complications: No apparent anesthesia complications

## 2015-02-03 NOTE — Transfer of Care (Signed)
Immediate Anesthesia Transfer of Care Note  Patient: Barbara Spencer  Procedure(s) Performed: Procedure(s): COLONOSCOPY WITH PROPOFOL (N/A)  Patient Location: PACU  Anesthesia Type:MAC  Level of Consciousness: awake, alert  and oriented  Airway & Oxygen Therapy: Patient Spontanous Breathing and Patient connected to face mask oxygen  Post-op Assessment: Report given to RN and Post -op Vital signs reviewed and stable  Post vital signs: Reviewed and stable  Last Vitals:  Filed Vitals:   02/03/15 0744  BP: 176/91  Pulse: 65  Temp: 36.4 C  Resp: 20    Complications: No apparent anesthesia complications

## 2015-02-03 NOTE — Discharge Instructions (Addendum)
No aspirin, ibuprofen or other NSAID medications for 5 days. Office will send note or call when pathology results are obtained. Colonoscopy will be repeated based on the pathology results.Colonoscopy, Care After These instructions give you information on caring for yourself after your procedure. Your doctor may also give you more specific instructions. Call your doctor if you have any problems or questions after your procedure. HOME CARE  Do not drive for 24 hours.  Do not sign important papers or use machinery for 24 hours.  You may shower.  You may go back to your usual activities, but go slower for the first 24 hours.  Take rest breaks often during the first 24 hours.  Walk around or use warm packs on your belly (abdomen) if you have belly cramping or gas.  Drink enough fluids to keep your pee (urine) clear or pale yellow.  Resume your normal diet. Avoid heavy or fried foods.  Avoid drinking alcohol for 24 hours or as told by your doctor.  Only take medicines as told by your doctor. If a tissue sample (biopsy) was taken during the procedure:   Do not take aspirin or blood thinners for 7 days, or as told by your doctor.  Do not drink alcohol for 7 days, or as told by your doctor.  Eat soft foods for the first 24 hours. GET HELP IF: You still have a small amount of blood in your poop (stool) 2-3 days after the procedure. GET HELP RIGHT AWAY IF:  You have more than a small amount of blood in your poop.  You see clumps of tissue (blood clots) in your poop.  Your belly is puffy (swollen).  You feel sick to your stomach (nauseous) or throw up (vomit).  You have a fever.  You have belly pain that gets worse and medicine does not help. MAKE SURE YOU:  Understand these instructions.  Will watch your condition.  Will get help right away if you are not doing well or get worse.   This information is not intended to replace advice given to you by your health care  provider. Make sure you discuss any questions you have with your health care provider.   Document Released: 04/14/2010 Document Revised: 03/17/2013 Document Reviewed: 11/17/2012 Elsevier Interactive Patient Education [employer].

## 2015-02-03 NOTE — Anesthesia Preprocedure Evaluation (Addendum)
Anesthesia Evaluation  Patient identified by MRN, date of birth, ID band Patient awake    Reviewed: Allergy & Precautions, NPO status , Patient's Chart, lab work & pertinent test results, reviewed documented beta blocker date and time   History of Anesthesia Complications Negative for: history of anesthetic complications  Airway Mallampati: IV  TM Distance: >3 FB Neck ROM: Full    Dental  (+) Dental Advisory Given, Missing, Poor Dentition   Pulmonary sleep apnea and Continuous Positive Airway Pressure Ventilation , former smoker,    Pulmonary exam normal breath sounds clear to auscultation       Cardiovascular Exercise Tolerance: Poor hypertension, Pt. on home beta blockers and Pt. on medications Normal cardiovascular exam Rhythm:Regular Rate:Normal     Neuro/Psych  Headaches, PSYCHIATRIC DISORDERS Anxiety Depression Bipolar Disorder    GI/Hepatic negative GI ROS, Neg liver ROS,   Endo/Other  diabetes, Type 2, Insulin Dependent, Oral Hypoglycemic AgentsHypothyroidism Morbid obesity  Renal/GU Renal InsufficiencyRenal disease     Musculoskeletal  (+) Arthritis ,   Abdominal   Peds  Hematology negative hematology ROS (+)   Anesthesia Other Findings Day of surgery medications reviewed with the patient.  Reproductive/Obstetrics                           Anesthesia Physical Anesthesia Plan  ASA: III  Anesthesia Plan: MAC   Post-op Pain Management:    Induction: Intravenous  Airway Management Planned: Simple Face Mask  Additional Equipment:   Intra-op Plan:   Post-operative Plan:   Informed Consent: I have reviewed the patients History and Physical, chart, labs and discussed the procedure including the risks, benefits and alternatives for the proposed anesthesia with the patient or authorized representative who has indicated his/her understanding and acceptance.   Dental advisory  given  Plan Discussed with: CRNA and Anesthesiologist  Anesthesia Plan Comments: (Discussed risks/benefits/alternatives to MAC sedation including need for ventilatory support, hypotension, need for conversion to general anesthesia.  All patient questions answered.  Patient wished to proceed.)        Anesthesia Quick Evaluation

## 2015-02-03 NOTE — Addendum Note (Signed)
Addended by: Zhavia Cunanan JR. on: 02/03/2015 06:34 AM   Modules accepted: Orders  

## 2015-02-03 NOTE — H&P (Signed)
Subjective:   Patient is a 59 y.o. female presents with need for screening colonoscopy. Father had colon polyps.. Procedure including risks and benefits discussed in office.  Patient Active Problem List   Diagnosis Date Noted  . DDD (degenerative disc disease), lumbar 08/29/2014  . Lumbar facet arthropathy 08/29/2014  . Status post lumbar laminectomy 08/29/2014  . Sacroiliac joint disease 08/29/2014  . Seasonal affective disorder ([place]) 07/14/2014  . Mood altered ([place]) 07/14/2014   Past Medical History  Diagnosis Date  . Bipolar disorder ([place])   . Thyroid disease   . Diabetes mellitus, type II ([place])   . HTN (hypertension)   . Sleep apnea   . Migraines   . Aortic stenosis   . Seasonal affective disorder ([place])   . Tachycardia   . DDD (degenerative disc disease), lumbar   . Obesity   . Anxiety   . Heart murmur     Past Surgical History  Procedure Laterality Date  . Cholecystectomy    . Cesarean section    . Appendectomy    . Cardiac catheterization      x3 -all clear  . Hernia repair      abdominal hernia repair after gallbladder surgery    Prescriptions prior to admission  Medication Sig Dispense Refill Last Dose  . buPROPion 450 MG TB24 Take 450 mg by mouth daily. 90 tablet 0 02/03/2015 at 0500  . Cholecalciferol (VITAMIN D3) 1000 UNITS CHEW Chew 6 tablets by mouth daily.    02/02/2015 at Unknown time  . clonazePAM (KLONOPIN) 0.5 MG tablet Take 1 tablet (0.5 mg total) by mouth 2 (two) times daily. 60 tablet 3 02/03/2015 at 0500  . diclofenac sodium (VOLTAREN) 1 % GEL Apply topically 2 (two) times daily as needed (pain). Apply to lower back or right hip.   02/02/2015 at Unknown time  . fenofibrate (TRICOR) 145 MG tablet Take 1 tablet by mouth daily.  6 02/02/2015 at Unknown time  . gabapentin (NEURONTIN) 300 MG capsule Take 300 mg by mouth 2 (two) times daily.    02/03/2015 at 0500  . haloperidol (HALDOL) 2 MG tablet Take 1 tablet (2 mg total) by mouth 2 (two) times daily.  180 tablet 0 02/02/2015 at Unknown time  . HYDROcodone-acetaminophen (NORCO/VICODIN) 5-325 MG tablet Take 1 tablet by mouth 2 (two) times daily.    02/02/2015 at Unknown time  . insulin lispro protamine-lispro (HUMALOG 75/25 MIX) (75-25) 100 UNIT/ML SUSP injection Inject 60 Units into the skin 2 (two) times daily with a meal.    02/02/2015 at Unknown time  . lamoTRIgine (LAMICTAL) 100 MG tablet Take 100 mg by mouth daily. Takes with 25mg  tablets for a total of 300mg  in the AM.   02/03/2015 at 0500  . lamoTRIgine (LAMICTAL) 25 MG tablet Take 8 tablets in the morning and 2 at noon (Patient taking differently: Take 50-200 mg by mouth 2 (two) times daily. Takes 8 tablets in the morning and 2 at noon.) 900 tablet 0 Taking  . levothyroxine (SYNTHROID, LEVOTHROID) 50 MCG tablet 50 mcg daily before breakfast.    02/02/2015 at Unknown time  . Melatonin 10 MG TABS Take 1 tablet by mouth at bedtime as needed (insomnia).    Past Month at Unknown time  . metFORMIN (GLUCOPHAGE) 500 MG tablet Take 1 tablet by mouth 2 (two) times daily.   02/02/2015 at Unknown time  . metoprolol (LOPRESSOR) 50 MG tablet Take 50 mg by mouth 2 (two) times daily.   02/03/2015 at 0500  .  polyethylene glycol-electrolytes (NULYTELY/GOLYTELY) 420 G solution as directed.  0 02/02/2015 at Unknown time  . rosuvastatin (CRESTOR) 40 MG tablet Take 1 tablet by mouth daily.  0 02/02/2015 at Unknown time  . topiramate (TOPAMAX) 50 MG tablet Take 50 mg by mouth 2 (two) times daily as needed (headache).    Past Month at Unknown time  . Venlafaxine HCl 225 MG TB24 Take 1 tablet (225 mg total) by mouth daily. 90 each 0 02/02/2015 at Unknown time  . ketoconazole (NIZORAL) 2 % cream Apply 1 application topically daily as needed for irritation.    More than a month at Unknown time  . losartan-hydrochlorothiazide (HYZAAR) 100-12.5 MG per tablet Take 1 tablet by mouth daily.    More than a month at Unknown time  . ondansetron (ZOFRAN) 4 MG tablet Take 1 tablet by  mouth daily as needed for nausea or vomiting.    More than a month at Unknown time   Allergies  Allergen Reactions  . Latex Hives    Pt denies latex allergy during phone interview (12-08-10)but is listed as an allergy in Dr [place] h&p  . Sulfa Antibiotics Swelling    Other reaction(s): Itching of Skin  . Tape Rash    Paper tape ok to use per pt-Tegaderm reddens skin and Electrodes "ripped skin off and now have scars"    Social History  Substance Use Topics  . Smoking status: Former Smoker    Types: Cigarettes    Quit date: 09/02/2004  . Smokeless tobacco: Never Used  . Alcohol Use: 0.0 oz/week    0 Standard drinks or equivalent per week     Comment: socially    Family History  Problem Relation Age of Onset  . Heart disease Mother   . Diabetes Mother   . Hypertension Mother   . Heart disease Father   . Dementia Father   . Alcohol abuse Father      Objective:   Patient Vitals for the past 8 hrs:  BP Temp Temp src Pulse Resp SpO2 Height Weight  02/03/15 0744 (!) 176/91 mmHg 97.6 F (36.4 C) Oral 65 20 95 % 5\' 2"  (1.575 m) (!) 146.965 kg (324 lb)         See MD Preop evaluation      Assessment:   1. Family history of colon polyps. Patient was never had colonoscopy. 2. Diabetes type II 3. Sleep apnea  Plan:   Will proceed at this time with screening colonoscopy

## 2015-02-03 NOTE — Op Note (Signed)
[facility] [place], [address]   COLONOSCOPY PROCEDURE REPORT  PATIENT: Barbara Spencer, Barbara Spencer  MR#: [id number] BIRTHDATE: Feb 01, 1956 , 35  yrs. old GENDER: female ENDOSCOPIST: Laurence Spates, MD REFERRED BY:  Dr. Humphrey Rolls PROCEDURE DATE:  02/28/2015 PROCEDURE:   colonoscopy and polypectomy  with tattoo ASA CLASS:    class III INDICATIONS:family history of colon polyps MEDICATIONS: ketamine 150 mg, propofol 220 mg  DESCRIPTION OF PROCEDURE:   After the risks and benefits and of the procedure were explained, informed consent was obtained.        The Pentax Colonoscope [id number]  endoscope was introduced through the anus and advanced to the cecum      .  The quality of the prep was fair      .  The instrument was then slowly withdrawn as the colon was fully examined. Estimated blood loss is zero unless otherwise noted in this procedure report. the prep was suboptimal. In the proximal ascending: 1 cm polyp was removed and recovered and placed in jar number 1. Distal AC 1/2 cm in # 2. In the  transverse colon 2 polyps ranging from three quarters to 1 1/4 cm were found and removed with a hot snare and placed in jar number3 . 1 1/2 cm polyp removed proximal descending colonchar number 4. 2-2.5centimeter cinema peduncle related polyp in the distaldescending colon removed and multiple pieces and placed in jar number 5. Due to the large size of this polyp in the distal descending colon this area was tattooed with spot SPOT, one cc. The scope was withdrawn. There was no bleeding or immediate complications. The patient tolerated the procedure well.    The scope was then withdrawn from the patient and the procedure completed.  WITHDRAWAL TIME:  COMPLICATIONS: There were no immediate complications. ENDOSCOPIC IMPRESSION: Multiple polyps removed from the colon with a large distal descending colon polyp removed. This area was tattooed. RECOMMENDATIONS: routine post  polypectomy instructions. We will plan on seeing the patient back in the office and one month to discuss follow-up.   REPEAT EXAM:  cc:Dr.Khan in [facility].  _______________________________ eSigned:  Laurence Spates, MD 02/28/2015 9:44 AM   CPT CODES: ICD CODES:  The ICD and CPT codes recommended by this software are interpretations from the data that the clinical staff has captured with the software.  The verification of the translation of this report to the ICD and CPT codes and modifiers is the sole responsibility of the health care institution and practicing physician where this report was generated.  [employer] will not be held responsible for the validity of the ICD and CPT codes included on this report.  AMA assumes no liability for data contained or not contained herein. CPT is a Designer, television/film set of the [employer].   PATIENT NAME:  Barbara, Spencer MR#: [id number]

## 2015-02-04 ENCOUNTER — Encounter (HOSPITAL_COMMUNITY): Payer: Self-pay | Admitting: Gastroenterology

## 2015-02-04 LAB — GLUCOSE, CAPILLARY: Glucose-Capillary: 140 mg/dL — ABNORMAL HIGH (ref 65–99)

## 2015-02-09 ENCOUNTER — Encounter: Payer: Self-pay | Admitting: Psychiatry

## 2015-02-09 ENCOUNTER — Ambulatory Visit (INDEPENDENT_AMBULATORY_CARE_PROVIDER_SITE_OTHER): Payer: PPO | Admitting: Psychiatry

## 2015-02-09 VITALS — BP 153/94 | HR 68 | Ht 62.0 in

## 2015-02-09 DIAGNOSIS — F3160 Bipolar disorder, current episode mixed, unspecified: Secondary | ICD-10-CM | POA: Diagnosis not present

## 2015-02-09 DIAGNOSIS — F39 Unspecified mood [affective] disorder: Secondary | ICD-10-CM

## 2015-02-09 DIAGNOSIS — F3340 Major depressive disorder, recurrent, in remission, unspecified: Secondary | ICD-10-CM

## 2015-02-09 MED ORDER — CLONAZEPAM 1 MG PO TABS: 1.0000 mg | ORAL_TABLET | Freq: Two times a day (BID) | ORAL | Status: DC
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
## 2015-04-11 ENCOUNTER — Encounter: Payer: Self-pay | Admitting: Psychiatry

## 2015-04-11 ENCOUNTER — Ambulatory Visit (INDEPENDENT_AMBULATORY_CARE_PROVIDER_SITE_OTHER): Payer: PPO | Admitting: Psychiatry

## 2015-04-11 VITALS — BP 138/90 | HR 88 | Temp 98.9°F | Ht 62.0 in | Wt 313.0 lb

## 2015-04-11 DIAGNOSIS — F419 Anxiety disorder, unspecified: Secondary | ICD-10-CM | POA: Diagnosis not present

## 2015-04-11 DIAGNOSIS — G894 Chronic pain syndrome: Secondary | ICD-10-CM | POA: Diagnosis not present

## 2015-04-11 DIAGNOSIS — E669 Obesity, unspecified: Secondary | ICD-10-CM | POA: Diagnosis not present

## 2015-04-11 DIAGNOSIS — F3340 Major depressive disorder, recurrent, in remission, unspecified: Secondary | ICD-10-CM

## 2015-04-11 DIAGNOSIS — F1120 Opioid dependence, uncomplicated: Secondary | ICD-10-CM | POA: Diagnosis not present

## 2015-04-11 DIAGNOSIS — G8929 Other chronic pain: Secondary | ICD-10-CM | POA: Diagnosis not present

## 2015-04-11 DIAGNOSIS — M5441 Lumbago with sciatica, right side: Secondary | ICD-10-CM | POA: Diagnosis not present

## 2015-04-11 DIAGNOSIS — M545 Low back pain: Secondary | ICD-10-CM | POA: Diagnosis not present

## 2015-04-11 DIAGNOSIS — F39 Unspecified mood [affective] disorder: Secondary | ICD-10-CM

## 2015-04-11 DIAGNOSIS — F319 Bipolar disorder, unspecified: Secondary | ICD-10-CM | POA: Diagnosis not present

## 2015-04-11 MED ORDER — VENLAFAXINE HCL ER 225 MG PO TB24: 225.0000 mg | ORAL_TABLET | Freq: Every day | ORAL | Status: DC

## 2015-04-11 MED ORDER — BUPROPION HCL ER (XL) 450 MG PO TB24: 450.0000 mg | ORAL_TABLET | Freq: Every day | ORAL | Status: DC

## 2015-04-11 MED ORDER — CLONAZEPAM 1 MG PO TABS: 1.0000 mg | ORAL_TABLET | Freq: Two times a day (BID) | ORAL | Status: DC

## 2015-04-11 MED ORDER — HALOPERIDOL 2 MG PO TABS: 2.0000 mg | ORAL_TABLET | Freq: Two times a day (BID) | ORAL | Status: DC

## 2015-04-11 MED ORDER — LAMOTRIGINE 100 MG PO TABS: 250.0000 mg | ORAL_TABLET | Freq: Every day | ORAL | Status: DC
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
## 2015-05-03 DIAGNOSIS — E119 Type 2 diabetes mellitus without complications: Secondary | ICD-10-CM | POA: Diagnosis not present

## 2015-05-03 DIAGNOSIS — E039 Hypothyroidism, unspecified: Secondary | ICD-10-CM | POA: Diagnosis not present

## 2015-05-03 DIAGNOSIS — E785 Hyperlipidemia, unspecified: Secondary | ICD-10-CM | POA: Diagnosis not present

## 2015-05-03 DIAGNOSIS — H109 Unspecified conjunctivitis: Secondary | ICD-10-CM | POA: Diagnosis not present

## 2015-05-03 DIAGNOSIS — I10 Essential (primary) hypertension: Secondary | ICD-10-CM | POA: Diagnosis not present

## 2015-05-03 DIAGNOSIS — R269 Unspecified abnormalities of gait and mobility: Secondary | ICD-10-CM | POA: Diagnosis not present

## 2015-05-03 DIAGNOSIS — E559 Vitamin D deficiency, unspecified: Secondary | ICD-10-CM | POA: Diagnosis not present

## 2015-05-03 DIAGNOSIS — E118 Type 2 diabetes mellitus with unspecified complications: Secondary | ICD-10-CM | POA: Diagnosis not present

## 2015-05-04 ENCOUNTER — Other Ambulatory Visit: Payer: Self-pay | Admitting: Internal Medicine

## 2015-05-04 DIAGNOSIS — Z1231 Encounter for screening mammogram for malignant neoplasm of breast: Secondary | ICD-10-CM

## 2015-05-05 DIAGNOSIS — E785 Hyperlipidemia, unspecified: Secondary | ICD-10-CM | POA: Diagnosis not present

## 2015-05-05 DIAGNOSIS — M545 Low back pain: Secondary | ICD-10-CM | POA: Diagnosis not present

## 2015-05-05 DIAGNOSIS — I340 Nonrheumatic mitral (valve) insufficiency: Secondary | ICD-10-CM | POA: Diagnosis not present

## 2015-05-05 DIAGNOSIS — G894 Chronic pain syndrome: Secondary | ICD-10-CM | POA: Diagnosis not present

## 2015-05-05 DIAGNOSIS — E669 Obesity, unspecified: Secondary | ICD-10-CM | POA: Diagnosis not present

## 2015-05-05 DIAGNOSIS — G8929 Other chronic pain: Secondary | ICD-10-CM | POA: Diagnosis not present

## 2015-05-05 DIAGNOSIS — G4733 Obstructive sleep apnea (adult) (pediatric): Secondary | ICD-10-CM | POA: Diagnosis not present

## 2015-05-05 DIAGNOSIS — I350 Nonrheumatic aortic (valve) stenosis: Secondary | ICD-10-CM | POA: Diagnosis not present

## 2015-05-05 DIAGNOSIS — I10 Essential (primary) hypertension: Secondary | ICD-10-CM | POA: Diagnosis not present

## 2015-05-16 DIAGNOSIS — E669 Obesity, unspecified: Secondary | ICD-10-CM | POA: Diagnosis not present

## 2015-05-16 DIAGNOSIS — G8929 Other chronic pain: Secondary | ICD-10-CM | POA: Diagnosis not present

## 2015-05-16 DIAGNOSIS — F1120 Opioid dependence, uncomplicated: Secondary | ICD-10-CM | POA: Diagnosis not present

## 2015-05-16 DIAGNOSIS — F419 Anxiety disorder, unspecified: Secondary | ICD-10-CM | POA: Diagnosis not present

## 2015-05-16 DIAGNOSIS — G894 Chronic pain syndrome: Secondary | ICD-10-CM | POA: Diagnosis not present

## 2015-05-16 DIAGNOSIS — F0631 Mood disorder due to known physiological condition with depressive features: Secondary | ICD-10-CM | POA: Diagnosis not present

## 2015-05-23 DIAGNOSIS — F419 Anxiety disorder, unspecified: Secondary | ICD-10-CM | POA: Diagnosis not present

## 2015-05-23 DIAGNOSIS — G894 Chronic pain syndrome: Secondary | ICD-10-CM | POA: Diagnosis not present

## 2015-05-23 DIAGNOSIS — F1120 Opioid dependence, uncomplicated: Secondary | ICD-10-CM | POA: Diagnosis not present

## 2015-05-23 DIAGNOSIS — M5441 Lumbago with sciatica, right side: Secondary | ICD-10-CM | POA: Diagnosis not present

## 2015-05-23 DIAGNOSIS — Z79899 Other long term (current) drug therapy: Secondary | ICD-10-CM | POA: Diagnosis not present

## 2015-05-23 DIAGNOSIS — E669 Obesity, unspecified: Secondary | ICD-10-CM | POA: Diagnosis not present

## 2015-05-23 DIAGNOSIS — G8929 Other chronic pain: Secondary | ICD-10-CM | POA: Diagnosis not present

## 2015-05-23 DIAGNOSIS — F319 Bipolar disorder, unspecified: Secondary | ICD-10-CM | POA: Diagnosis not present

## 2015-05-23 DIAGNOSIS — M545 Low back pain: Secondary | ICD-10-CM | POA: Diagnosis not present

## 2015-05-24 DIAGNOSIS — I10 Essential (primary) hypertension: Secondary | ICD-10-CM | POA: Diagnosis not present

## 2015-05-24 DIAGNOSIS — R269 Unspecified abnormalities of gait and mobility: Secondary | ICD-10-CM | POA: Diagnosis not present

## 2015-05-24 DIAGNOSIS — G8929 Other chronic pain: Secondary | ICD-10-CM | POA: Diagnosis not present

## 2015-05-24 DIAGNOSIS — E119 Type 2 diabetes mellitus without complications: Secondary | ICD-10-CM | POA: Diagnosis not present

## 2015-05-24 DIAGNOSIS — E785 Hyperlipidemia, unspecified: Secondary | ICD-10-CM | POA: Diagnosis not present

## 2015-05-26 ENCOUNTER — Ambulatory Visit: Payer: PPO | Attending: Internal Medicine | Admitting: Physical Therapy

## 2015-05-26 ENCOUNTER — Encounter: Payer: Self-pay | Admitting: Physical Therapy

## 2015-05-26 DIAGNOSIS — R296 Repeated falls: Secondary | ICD-10-CM | POA: Diagnosis not present

## 2015-05-26 DIAGNOSIS — M25551 Pain in right hip: Secondary | ICD-10-CM

## 2015-05-26 DIAGNOSIS — R262 Difficulty in walking, not elsewhere classified: Secondary | ICD-10-CM | POA: Diagnosis not present

## 2015-05-26 DIAGNOSIS — Z9181 History of falling: Secondary | ICD-10-CM

## 2015-05-26 NOTE — Patient Instructions (Signed)
Rotation    Lie on back, left legs straight, Bend right knee and push right knee to left side until stretch is felt on right hip.  Hold _15___ seconds. Repeat 2-3 times Do __2__ sessions per day.  Copyright  [employer]. All rights reserved.  Lateral Trunk / IT Band Stretch    Stand beside counter, (right side closest to counter) hold onto counter with right hand, lean right hip into counter until stretch is felt in side of hip. Do _1__ sets of __2-3_ repetitions.(hold for 10 sec each)  Copyright  [employer]. All rights reserved.

## 2015-05-26 NOTE — Therapy (Addendum)
[place] MAIN [facility] SERVICES [address] [place], [place], [address] Phone: [phone]   Fax:  [phone]  Physical Therapy Evaluation  Patient Details  Name: Barbara Spencer MRN: [id number] Date of Birth: [birth year] Referring Provider: Myrtie Hawk   Encounter Date: 05/26/2015      PT End of Session - 05/26/15 1335    Visit Number 1   Number of Visits 17   Date for PT Re-Evaluation 07/21/15   PT Start Time 0940   PT Stop Time 1030   PT Time Calculation (min) 50 min   Activity Tolerance Patient tolerated treatment well   Behavior During Therapy [facility] for tasks assessed/performed      Past Medical History  Diagnosis Date  . Bipolar disorder ([place])   . Thyroid disease   . Diabetes mellitus, type II ([place])   . HTN (hypertension)   . Sleep apnea   . Migraines   . Aortic stenosis   . Tachycardia   . Obesity   . Heart murmur   . Seasonal affective disorder ([place])     under control  . Anxiety     managing well with medication  . DDD (degenerative disc disease), lumbar     back and hip    Past Surgical History  Procedure Laterality Date  . Cholecystectomy    . Cesarean section    . Appendectomy    . Cardiac catheterization      x3 -all clear  . Hernia repair      abdominal hernia repair after gallbladder surgery  . Colonoscopy with propofol N/A 02/03/2015    Procedure: COLONOSCOPY WITH PROPOFOL;  Surgeon: Laurence Spates, MD;  Location: [facility];  Service: Endoscopy;  Laterality: N/A;    There were no vitals filed for this visit.  Visit Diagnosis:  Right hip pain - Plan: PT plan of care cert/re-cert  Difficulty walking - Plan: PT plan of care cert/re-cert  At high risk for falls - Plan: PT plan of care cert/re-cert      Subjective Assessment - 05/26/15 0944    Subjective 60 yo Female reports increased right hip pain and recent falls; Patient reports 6 falls in last few months. She is using a quad cane  when outside home and also has a rollator that she will use sometimes; She reports her falls occur when she starts walking and she gets to going to fast and falls forward; Patient has OA and DDD in low back and right hip; She reports that her hip pain hasn't changed any recently; She denies any numbness/tingling; She denies any dizziness; Patient is obese;    Pertinent History personal factors affecting rehab: anxiety/depression with Bipolar disorder, obesity, high fall risk;    Limitations Sitting;Standing;Walking   How long can you sit comfortably? 10-15 min due to hip pain   How long can you stand comfortably? 5 min due to hip pain;    How long can you walk comfortably? about 100 feet with quad cane limited with hip pain;    Patient Stated Goals be able to squat and stand up without problem; reduce right hip pain; be able to walk and sit longer periods of time;    Currently in Pain? Yes   Pain Score 1    Pain Location Hip   Pain Orientation Right   Pain Descriptors / Indicators Aching;Sore   Pain Type Chronic pain   Pain Onset More than a month ago   Pain Frequency Intermittent  Aggravating Factors  getting out of bath tub, sitting/standing long time; needs help in/out of bed;    Pain Relieving Factors pain medicine, hasn't tried heat/ice; hooklying position;    Effect of Pain on Daily Activities decreased; fear of falling when walking outside and therefore not walking outside;             [facility] PT Assessment - 05/26/15 0001    Assessment   Medical Diagnosis falls/right hip pain;    Referring Provider Myrtie Hawk    Onset Date/Surgical Date 05/25/14   Hand Dominance Right   Next MD Visit 30 days   Prior Therapy denies any PT for this condition;    Precautions   Precautions Fall   Restrictions   Weight Bearing Restrictions No   Balance Screen   Has the patient fallen in the past 6 months Yes   How many times? 6   Has the patient had a decrease in activity level  because of a fear of falling?  Yes   Is the patient reluctant to leave their home because of a fear of falling?  Yes   Home Environment   Additional Comments single story, 6 steps to enter with B rails, usually does one step at a time; lives with husband who helps with all ADLs;    Prior Function   Level of Independence Independent with basic ADLs;Independent with gait;Independent with transfers   Vocation Retired   Leisure likes to read, do puzzles,    Cognition   Overall Cognitive Status Within Functional Limits for tasks assessed   Observation/Other Assessments   Lower Extremity Functional Scale  14/80 (the lower the score the greater the disability;   Sensation   Light Touch Appears Intact   Coordination   Gross Motor Movements are Fluid and Coordinated Yes   Fine Motor Movements are Fluid and Coordinated Yes   Posture/Postural Control   Posture Comments demonstrates moderate slumped posture with increased shoulder protraction, increased thoracic kyphosis and reduced lordosis. Able to self correct with verbal cues;    AROM   Overall AROM Comments BUE and BLE AROM is [facility]   Strength   Overall Strength Comments BUE is grossly 4/5, BLE: hip grossly 3+/5, knee 3/5, ankle 3/5   Palpation   Palpation comment has moderate to severe tenderness along right greater trochanter; no pain along IT band;    Bed Mobility   Supine to Sit 3: Mod assist   Supine to Sit Details (indicate cue type and reason) requires assistance for sliding legs off table and for pushing up from bed due to weakness and obesity;    Sit to Supine 4: Min assist   Sit to Supine - Details (indicate cue type and reason) requires assistance for lifting legs into bed;    Transfers   Comments able to transfer sit<>stand without pushing on chair, but is slow; denies any increase in hip or knee pain;    Ambulation/Gait   Gait Comments ambulates with 3 point pattern with SBQC in RUE; Patient exhibits decreased step length and  slower gait speed with narrow base of support;    Standardized Balance Assessment   Five times sit to stand comments  31 sec without pushing on chair (>15 sec indicates increased risk for falls)   10 Meter Walk 0.37 m/s with SBQC (high fall risk. limited home ambulation, high risk for falls)     Instructed patient in IT band stretches: Supine with RLE lateral hip stretch 15 sec hold  x2; Standing Right IT band stretch 15 sec x2; Patient required min-moderate verbal/tactile cues for correct exercise technique.                       PT Education - 05/26/15 1335    Education provided Yes   Education Details initiated HEP   Person(s) Educated Patient   Methods Explanation;Verbal cues   Comprehension Verbalized understanding;Returned demonstration;Verbal cues required      Gcodes: based on clinical judgement, 5 times sit<>Stand, 10 meter walk; Current: G8978 CL- 60-80% impaired Goal: G8979 CJ 20-40% impaired       PT Long Term Goals - 05/26/15 1341    PT LONG TERM GOAL #1   Title Patient will be independent in home exercise program to improve strength/mobility for better functional independence with ADLs. by 07/21/15   Time 8   Period Weeks   Status New   PT LONG TERM GOAL #2   Title Patient (> 17 years old) will complete five times sit to stand test in < 15 seconds indicating an increased LE strength and improved balance. by 07/21/15   Time 8   Period Weeks   Status New   PT LONG TERM GOAL #3   Title Patient will increase 10 meter walk test to >1.14m/s as to improve gait speed for better community ambulation and to reduce fall risk. by 07/21/15   Time 8   Period Weeks   Status New   PT LONG TERM GOAL #4   Title Patient will report a worst pain of 3/10 on VAS in     right hip        to improve tolerance with ADLs and reduced symptoms with activities. by 07/21/15   Time 8   Period Weeks   Status New   PT LONG TERM GOAL #5   Title Patient will increase lower  extremity functional scale to >40/80 to demonstrate improved functional mobility and increased tolerance with ADLs. by 07/21/15   Time 8   Period Weeks   Status New               Plan - 05/26/15 1336    Clinical Impression Statement 60 yo Female reports increased right lateral hip pain. Patient reports increased falls over last few months. Patient has now become fearful to leave her home because of fear of falling. Patient tested as a high fall risk. Her lateral hip pain seems to be related to possible greater trochanter bursitis. Patient would benefit from additional skilled PT intervention to improve balance/gait safety and reduce hip pain;    Pt will benefit from skilled therapeutic intervention in order to improve on the following deficits Abnormal gait;Decreased endurance;Obesity;Cardiopulmonary status limiting activity;Decreased activity tolerance;Decreased strength;Pain;Difficulty walking;Decreased mobility;Decreased balance;Improper body mechanics;Impaired flexibility;Decreased safety awareness   Rehab Potential Fair   Clinical Impairments Affecting Rehab Potential positive: husband supportive, negative: co-morbidities; patient's presentation is evolving as her hip pain is up/down and her high fall risk really limits her mobility;    PT Frequency 2x / week   PT Duration 8 weeks   PT Treatment/Interventions ADLs/Self Care Home Management;Cryotherapy;Electrical Stimulation;Iontophoresis 4mg /ml Dexamethasone;Moist Heat;Balance training;Therapeutic exercise;Therapeutic activities;Functional mobility training;Stair training;Gait training;DME Instruction;Ultrasound;Neuromuscular re-education;Patient/family education;Manual techniques;Taping;Energy conservation;Dry needling   PT Next Visit Plan try berg? 6 min walk test? modalities for hip   PT Home Exercise Plan initiated with IT band stretch   Consulted and Agree with Plan of Care Patient         Problem  List Patient Active Problem  List   Diagnosis Date Noted  . DDD (degenerative disc disease), lumbar 08/29/2014  . Lumbar facet arthropathy 08/29/2014  . Status post lumbar laminectomy 08/29/2014  . Sacroiliac joint disease 08/29/2014  . Seasonal affective disorder ([place]) 07/14/2014  . Mood altered ([place]) 07/14/2014    Brandii Lakey PT, DPT 05/26/2015, 1:44 PM  [place] [facility] MAIN [facility] SERVICES [address] [place], [place], [address] Phone: [phone]   Fax:  [phone]  Name: Barbara Spencer MRN: [id number] Date of Birth: [birth year]

## 2015-05-27 NOTE — Progress Notes (Signed)
Dosage changed

## 2015-05-30 ENCOUNTER — Ambulatory Visit: Payer: PPO | Admitting: Physical Therapy

## 2015-05-31 DIAGNOSIS — I420 Dilated cardiomyopathy: Secondary | ICD-10-CM | POA: Diagnosis not present

## 2015-05-31 DIAGNOSIS — I340 Nonrheumatic mitral (valve) insufficiency: Secondary | ICD-10-CM | POA: Diagnosis not present

## 2015-05-31 DIAGNOSIS — I10 Essential (primary) hypertension: Secondary | ICD-10-CM | POA: Diagnosis not present

## 2015-05-31 DIAGNOSIS — E785 Hyperlipidemia, unspecified: Secondary | ICD-10-CM | POA: Diagnosis not present

## 2015-05-31 DIAGNOSIS — I350 Nonrheumatic aortic (valve) stenosis: Secondary | ICD-10-CM | POA: Diagnosis not present

## 2015-06-01 ENCOUNTER — Encounter: Payer: Self-pay | Admitting: Physical Therapy

## 2015-06-01 ENCOUNTER — Ambulatory Visit: Payer: PPO | Admitting: Physical Therapy

## 2015-06-01 DIAGNOSIS — M25551 Pain in right hip: Secondary | ICD-10-CM | POA: Diagnosis not present

## 2015-06-01 DIAGNOSIS — Z9181 History of falling: Secondary | ICD-10-CM

## 2015-06-01 DIAGNOSIS — R262 Difficulty in walking, not elsewhere classified: Secondary | ICD-10-CM

## 2015-06-01 NOTE — Patient Instructions (Addendum)
  ABDUCTION: Standing (Active)    Stand, feet flat. Lift right leg out to side. Use 0__ lbs. Complete __1_ sets of 10__ repetitions. Perform __2_ sessions per day.  http://gtsc.exer.us/111   Copyright  VHI. All rights reserved.  Chair Pose    Stand with legs hip-width apart. Inhaling, bend knees trying to keep knees over ankles, as if to sit on a chair and extend arms in front, wrists slightly lower than shoulders. Hold position for 2___ breaths. Exhaling, stand up. Repeat _10__ times. Do __2_ times per day.  Copyright  VHI. All rights reserved.  Heel Raises    Stand with support. Tighten pelvic floor and hold. With knees straight, raise heels off ground. Hold _2__ seconds. Relax for __2_ seconds. Repeat 10___ times. Do _2__ times a day.  Copyright  VHI. All rights reserved.

## 2015-06-01 NOTE — Therapy (Signed)
[place] MAIN [facility] SERVICES [address] [place], [place], [address] Phone: [phone]   Fax:  [phone]  Physical Therapy Treatment  Patient Details  Name: Barbara Spencer MRN: [id number] Date of Birth: [birth year] Referring Provider: Myrtie Hawk   Encounter Date: 06/01/2015      PT End of Session - 06/01/15 1252    Visit Number 2   Number of Visits 17   Date for PT Re-Evaluation 07/21/15   PT Start Time 0920   PT Stop Time 1018   PT Time Calculation (min) 58 min   Activity Tolerance Patient tolerated treatment well;No increased pain   Behavior During Therapy [facility] for tasks assessed/performed      Past Medical History  Diagnosis Date  . Bipolar disorder ([place])   . Thyroid disease   . Diabetes mellitus, type II ([place])   . HTN (hypertension)   . Sleep apnea   . Migraines   . Aortic stenosis   . Tachycardia   . Obesity   . Heart murmur   . Seasonal affective disorder ([place])     under control  . Anxiety     managing well with medication  . DDD (degenerative disc disease), lumbar     back and hip    Past Surgical History  Procedure Laterality Date  . Cholecystectomy    . Cesarean section    . Appendectomy    . Cardiac catheterization      x3 -all clear  . Hernia repair      abdominal hernia repair after gallbladder surgery  . Colonoscopy with propofol N/A 02/03/2015    Procedure: COLONOSCOPY WITH PROPOFOL;  Surgeon: Laurence Spates, MD;  Location: [facility];  Service: Endoscopy;  Laterality: N/A;    There were no vitals filed for this visit.  Visit Diagnosis:  Right hip pain  Difficulty walking  At high risk for falls      Subjective Assessment - 06/01/15 0926    Subjective Patient reports falling at home. She reports trying to get out of her bed. She reports standing up and then just turning and falling. She needed help with getting up. She reports increased soreness but no pain currently;    Pertinent History personal factors affecting rehab: anxiety/depression with Bipolar disorder, obesity, high fall risk;    Limitations Sitting;Standing;Walking   How long can you sit comfortably? 10-15 min due to hip pain   How long can you stand comfortably? 5 min due to hip pain;    How long can you walk comfortably? about 100 feet with quad cane limited with hip pain;    Patient Stated Goals be able to squat and stand up without problem; reduce right hip pain; be able to walk and sit longer periods of time;    Currently in Pain? Yes   Pain Score 2    Pain Location Hip   Pain Orientation Right   Pain Descriptors / Indicators Burning   Pain Type Chronic pain   Pain Onset More than a month ago            [facility] PT Assessment - 06/01/15 0001    Berg Balance Test   Sit to Stand Able to stand without using hands and stabilize independently   Standing Unsupported Able to stand 2 minutes with supervision   Sitting with Back Unsupported but Feet Supported on Floor or Stool Able to sit safely and securely 2 minutes   Stand to Sit Controls descent by  using hands   Transfers Able to transfer safely, definite need of hands   Standing Unsupported with Eyes Closed Able to stand 10 seconds with supervision   Standing Ubsupported with Feet Together Able to place feet together independently and stand for 1 minute with supervision   From Standing, Reach Forward with Outstretched Arm Can reach forward >5 cm safely (2")   From Standing Position, Pick up Object from [place] to pick up shoe, needs supervision   From Standing Position, Turn to Look Behind Over each Shoulder Looks behind one side only/other side shows less weight shift   Turn 360 Degrees Needs close supervision or verbal cueing   Standing Unsupported, Alternately Place Feet on Step/Stool Needs assistance to keep from falling or unable to try   Standing Unsupported, One Foot in [employer] balance while stepping or standing   Standing on  One Leg Unable to try or needs assist to prevent fall   Total Score 32 (<36/56 indicates high fall risk)       TREATMENT:  Warm up on [place] level 1, BUE/BLE x4 min (unbilled);  Instructed patient in [place] Balance assessment to assess fall risk. See above;  Patient ambulated with quad cane, x15 feet very slowly with unsteady gait pattern; Instructed patient to ambulate with rollator x20 feet, x10 feet with min VCs to increase step length and stay close to walker for safety;  Instructed patient in BLE strengthening exercise: Standing: Hip abduction x10 bilaterally with mod VCs to avoid hip ER and reduce trunk lean for better hip strengthening; Heel raises x10; Mini squat x10 reps with min Vcs to increase posterior hip for better positioning;  Patient very slow with exercise and required increased cues for correct technique;  PT performed US to right greater trochanger, 1 MHz, 1.8 watts per centimeter squared x8 min; Patient reports mild tenderness at start of treatment and no tenderness afterwards. She reports no pain upon standing and walking after [place] treatment;                           PT Education - 06/01/15 1252    Education provided Yes   Education Details advanced HEP, gait safety   Person(s) Educated Patient   Methods Explanation;Verbal cues   Comprehension Verbalized understanding;Returned demonstration;Verbal cues required             PT Long Term Goals - 05/26/15 1341    PT LONG TERM GOAL #1   Title Patient will be independent in home exercise program to improve strength/mobility for better functional independence with ADLs. by 07/21/15   Time 8   Period Weeks   Status New   PT LONG TERM GOAL #2   Title Patient (> 60 years old) will complete five times sit to stand test in < 15 seconds indicating an increased LE strength and improved balance. by 07/21/15   Time 8   Period Weeks   Status New   PT LONG TERM GOAL #3   Title Patient will  increase 10 meter walk test to >1.36m/s as to improve gait speed for better community ambulation and to reduce fall risk. by 07/21/15   Time 8   Period Weeks   Status New   PT LONG TERM GOAL #4   Title Patient will report a worst pain of 3/10 on VAS in     right hip        to improve tolerance with ADLs and reduced  symptoms with activities. by 07/21/15   Time 8   Period Weeks   Status New   PT LONG TERM GOAL #5   Title Patient will increase lower extremity functional scale to >40/80 to demonstrate improved functional mobility and increased tolerance with ADLs. by 07/21/15   Time 8   Period Weeks   Status New               Plan - 06/01/15 1252    Clinical Impression Statement Instructed patient in [place] assessment to determine fall risk. Patient did test as a high fall risk; Patient did require close supervision to [facility] for safety with most standing task. Patient very unsteady with walking with quad cane. Instructed pateint to start using rollator with gait tasks for better balance. Patient instructed in BLE strengthening with mod verbal and tactile cues for correct technique. Finished with [place] to right greater trochanter for less discomfort. Patient reports no pain after treatment session; She would benefit from additional skilled PT intervention to improve LE strength and mobility;    Pt will benefit from skilled therapeutic intervention in order to improve on the following deficits Abnormal gait;Decreased endurance;Obesity;Cardiopulmonary status limiting activity;Decreased activity tolerance;Decreased strength;Pain;Difficulty walking;Decreased mobility;Decreased balance;Improper body mechanics;Impaired flexibility;Decreased safety awareness   Rehab Potential Fair   Clinical Impairments Affecting Rehab Potential positive: husband supportive, negative: co-morbidities; patient's presentation is evolving as her hip pain is up/down and her high fall risk really limits her mobility;    PT  Frequency 2x / week   PT Duration 8 weeks   PT Treatment/Interventions ADLs/Self Care Home Management;Cryotherapy;Electrical Stimulation;Iontophoresis 4mg /ml Dexamethasone;Moist Heat;Balance training;Therapeutic exercise;Therapeutic activities;Functional mobility training;Stair training;Gait training;DME Instruction;Ultrasound;Neuromuscular re-education;Patient/family education;Manual techniques;Taping;Energy conservation;Dry needling   PT Next Visit Plan work on strengthening, balance;    PT Home Exercise Plan advanced- see patient instructions;    Consulted and Agree with Plan of Care Patient        Problem List Patient Active Problem List   Diagnosis Date Noted  . DDD (degenerative disc disease), lumbar 08/29/2014  . Lumbar facet arthropathy 08/29/2014  . Status post lumbar laminectomy 08/29/2014  . Sacroiliac joint disease 08/29/2014  . Seasonal affective disorder ([place]) 07/14/2014  . Mood altered ([place]) 07/14/2014    Benyamin Jeff PT, DPT 06/01/2015, 12:58 PM  [place] [facility] MAIN [facility] SERVICES [address] [place], [place], [address] Phone: [phone]   Fax:  [phone]  Name: Mahlani MERIN DELREAL MRN: [id number] Date of Birth: [birth year]

## 2015-06-06 ENCOUNTER — Ambulatory Visit: Payer: PPO

## 2015-06-06 VITALS — BP 141/74 | HR 74

## 2015-06-06 DIAGNOSIS — G894 Chronic pain syndrome: Secondary | ICD-10-CM | POA: Diagnosis not present

## 2015-06-06 DIAGNOSIS — R262 Difficulty in walking, not elsewhere classified: Secondary | ICD-10-CM

## 2015-06-06 DIAGNOSIS — F419 Anxiety disorder, unspecified: Secondary | ICD-10-CM | POA: Diagnosis not present

## 2015-06-06 DIAGNOSIS — E669 Obesity, unspecified: Secondary | ICD-10-CM | POA: Diagnosis not present

## 2015-06-06 DIAGNOSIS — F1120 Opioid dependence, uncomplicated: Secondary | ICD-10-CM | POA: Diagnosis not present

## 2015-06-06 DIAGNOSIS — M25551 Pain in right hip: Secondary | ICD-10-CM | POA: Diagnosis not present

## 2015-06-06 DIAGNOSIS — Z9181 History of falling: Secondary | ICD-10-CM

## 2015-06-06 DIAGNOSIS — F0631 Mood disorder due to known physiological condition with depressive features: Secondary | ICD-10-CM | POA: Diagnosis not present

## 2015-06-06 DIAGNOSIS — G8929 Other chronic pain: Secondary | ICD-10-CM | POA: Diagnosis not present

## 2015-06-06 NOTE — Therapy (Signed)
[place] MAIN [facility] SERVICES [address] [place], [place], [address] Phone: [phone]   Fax:  [phone]  Physical Therapy Treatment  Patient Details  Name: Barbara Spencer MRN: [id number] Date of Birth: 13-Sep-1955 Referring Provider: Myrtie Hawk   Encounter Date: 06/06/2015      PT End of Session - 06/06/15 1049    Visit Number 3   Number of Visits 17   Date for PT Re-Evaluation 07/21/15   PT Start Time 1020   PT Stop Time 1100   PT Time Calculation (min) 40 min   Equipment Utilized During Treatment Gait belt   Activity Tolerance Patient tolerated treatment well;No increased pain   Behavior During Therapy [facility] for tasks assessed/performed      Past Medical History  Diagnosis Date  . Bipolar disorder ([place])   . Thyroid disease   . Diabetes mellitus, type II ([place])   . HTN (hypertension)   . Sleep apnea   . Migraines   . Aortic stenosis   . Tachycardia   . Obesity   . Heart murmur   . Seasonal affective disorder ([place])     under control  . Anxiety     managing well with medication  . DDD (degenerative disc disease), lumbar     back and hip    Past Surgical History  Procedure Laterality Date  . Cholecystectomy    . Cesarean section    . Appendectomy    . Cardiac catheterization      x3 -all clear  . Hernia repair      abdominal hernia repair after gallbladder surgery  . Colonoscopy with propofol N/A 02/03/2015    Procedure: COLONOSCOPY WITH PROPOFOL;  Surgeon: Laurence Spates, MD;  Location: [facility];  Service: Endoscopy;  Laterality: N/A;    Filed Vitals:   06/06/15 1026  BP: 141/74  Pulse: 74  SpO2: 95%    Visit Diagnosis:  At high risk for falls  Difficulty walking      Subjective Assessment - 06/06/15 1048    Subjective Pt states she is doing well today. She denies any hip pain today. No other pain reported. She reports compliance with HEP. No specific questions or concerns at this time.  Pt reports that she has been performing some ambulation around the house without any assistive device.    Pertinent History personal factors affecting rehab: anxiety/depression with Bipolar disorder, obesity, high fall risk;    Limitations Sitting;Standing;Walking   How long can you sit comfortably? 10-15 min due to hip pain   How long can you stand comfortably? 5 min due to hip pain;    How long can you walk comfortably? about 100 feet with quad cane limited with hip pain;    Patient Stated Goals be able to squat and stand up without problem; reduce right hip pain; be able to walk and sit longer periods of time;    Currently in Pain? No/denies        TREATMENT  Pt ambulated with rolling walker x 50 feet with min VCs to increase step length and stay close to walker for safety especially during turns. Pt requires cues for scanning of visual environment and to keep from bumping into objects; Practiced transfers with patient to and from NuStep; Nustep level 1, BUE/BLE x 5 min with continuous monitoring and cues to keep SPM over 40 throughout.   Instructed patient in BLE strengthening exercise: Sit to stand x 10 from mat table; Seated marches  red Tband (latex-free) 2 x 10; Seated abduction red Tband 2 x 10; Seated red Tband resisted LAQ 2 x 10; Seated red Tband resisted HS curls 2 x 10;  Standing hip strengthening (all with 2# ankle weights): Hip abduction x 10 bilaterally with mod VCs to avoid hip ER and reduce trunk lean for better hip strengthening; Hip extension x 10 bilaterally with cues to avoid lumbar extension compensation;  Standing exercises: Heel raises 2 x 10; Mini squat 2 x 10 reps with min Vcs to increase posterior hip for better positioning;  Patient very slow with exercise and required increased cues for correct technique;                         PT Education - 06/06/15 1049    Education provided Yes   Education Details Reinforced HEP    Person(s) Educated Patient   Methods Explanation   Comprehension Verbalized understanding             PT Long Term Goals - 05/26/15 1341    PT LONG TERM GOAL #1   Title Patient will be independent in home exercise program to improve strength/mobility for better functional independence with ADLs. by 07/21/15   Time 8   Period Weeks   Status New   PT LONG TERM GOAL #2   Title Patient (> 68 years old) will complete five times sit to stand test in < 15 seconds indicating an increased LE strength and improved balance. by 07/21/15   Time 8   Period Weeks   Status New   PT LONG TERM GOAL #3   Title Patient will increase 10 meter walk test to >1.63m/s as to improve gait speed for better community ambulation and to reduce fall risk. by 07/21/15   Time 8   Period Weeks   Status New   PT LONG TERM GOAL #4   Title Patient will report a worst pain of 3/10 on VAS in     right hip        to improve tolerance with ADLs and reduced symptoms with activities. by 07/21/15   Time 8   Period Weeks   Status New   PT LONG TERM GOAL #5   Title Patient will increase lower extremity functional scale to >40/80 to demonstrate improved functional mobility and increased tolerance with ADLs. by 07/21/15   Time 8   Period Weeks   Status New               Plan - 06/06/15 1049    Clinical Impression Statement Pt demonstrates instability however well corrected with use of rolling walker. Poor scanning of visual environment and poor safety awareness during gait. Pt moves very slow during today's session taking extended time to complete exercises. Pt encouraged to continue current HEP and follow-up as scheduled. No pain reported throughout session.    Pt will benefit from skilled therapeutic intervention in order to improve on the following deficits Abnormal gait;Decreased endurance;Obesity;Cardiopulmonary status limiting activity;Decreased activity tolerance;Decreased strength;Pain;Difficulty walking;Decreased  mobility;Decreased balance;Improper body mechanics;Impaired flexibility;Decreased safety awareness   Rehab Potential Fair   Clinical Impairments Affecting Rehab Potential positive: husband supportive, negative: co-morbidities; patient's presentation is evolving as her hip pain is up/down and her high fall risk really limits her mobility;    PT Frequency 2x / week   PT Duration 8 weeks   PT Treatment/Interventions ADLs/Self Care Home Management;Cryotherapy;Electrical Stimulation;Iontophoresis 4mg /ml Dexamethasone;Moist Heat;Balance training;Therapeutic exercise;Therapeutic activities;Functional mobility training;Stair training;Gait  training;DME Instruction;Ultrasound;Neuromuscular re-education;Patient/family education;Manual techniques;Taping;Energy conservation;Dry needling   PT Next Visit Plan work on strengthening, balance;    PT Home Exercise Plan advanced- see patient instructions;    Consulted and Agree with Plan of Care Patient        Problem List Patient Active Problem List   Diagnosis Date Noted  . DDD (degenerative disc disease), lumbar 08/29/2014  . Lumbar facet arthropathy 08/29/2014  . Status post lumbar laminectomy 08/29/2014  . Sacroiliac joint disease 08/29/2014  . Seasonal affective disorder ([place]) 07/14/2014  . Mood altered ([place]) 07/14/2014   Phillips Grout PT, DPT Huprich,Jason 06/06/2015, 11:04 AM  [place] MAIN [facility] SERVICES [address] [place], [place], [address] Phone: [phone]   Fax:  [phone]  Name: Barbara Spencer MRN: [id number] Date of Birth: 1955-06-25

## 2015-06-08 DIAGNOSIS — S0990XA Unspecified injury of head, initial encounter: Secondary | ICD-10-CM | POA: Diagnosis not present

## 2015-06-08 DIAGNOSIS — Z8673 Personal history of transient ischemic attack (TIA), and cerebral infarction without residual deficits: Secondary | ICD-10-CM | POA: Diagnosis not present

## 2015-06-08 DIAGNOSIS — R296 Repeated falls: Secondary | ICD-10-CM | POA: Diagnosis not present

## 2015-06-08 DIAGNOSIS — G9389 Other specified disorders of brain: Secondary | ICD-10-CM | POA: Diagnosis not present

## 2015-06-09 ENCOUNTER — Ambulatory Visit: Payer: PPO | Admitting: Physical Therapy

## 2015-06-09 ENCOUNTER — Encounter: Payer: Self-pay | Admitting: Physical Therapy

## 2015-06-09 DIAGNOSIS — M25551 Pain in right hip: Secondary | ICD-10-CM | POA: Diagnosis not present

## 2015-06-09 DIAGNOSIS — R262 Difficulty in walking, not elsewhere classified: Secondary | ICD-10-CM

## 2015-06-09 DIAGNOSIS — Z9181 History of falling: Secondary | ICD-10-CM

## 2015-06-09 NOTE — Therapy (Signed)
[place] MAIN [facility] SERVICES [address] [place], [place], [address] Phone: [phone]   Fax:  [phone]  Physical Therapy Treatment  Patient Details  Name: Barbara Spencer MRN: [id number] Date of Birth: 1955-04-13 Referring Provider: Myrtie Hawk   Encounter Date: 06/09/2015      PT End of Session - 06/09/15 1122    Visit Number 4   Number of Visits 17   Date for PT Re-Evaluation 07/21/15   PT Start Time 1115   PT Stop Time 1200   PT Time Calculation (min) 45 min   Equipment Utilized During Treatment Gait belt   Activity Tolerance Patient tolerated treatment well;No increased pain   Behavior During Therapy [facility] for tasks assessed/performed      Past Medical History  Diagnosis Date  . Bipolar disorder ([place])   . Thyroid disease   . Diabetes mellitus, type II ([place])   . HTN (hypertension)   . Sleep apnea   . Migraines   . Aortic stenosis   . Tachycardia   . Obesity   . Heart murmur   . Seasonal affective disorder ([place])     under control  . Anxiety     managing well with medication  . DDD (degenerative disc disease), lumbar     back and hip    Past Surgical History  Procedure Laterality Date  . Cholecystectomy    . Cesarean section    . Appendectomy    . Cardiac catheterization      x3 -all clear  . Hernia repair      abdominal hernia repair after gallbladder surgery  . Colonoscopy with propofol N/A 02/03/2015    Procedure: COLONOSCOPY WITH PROPOFOL;  Surgeon: Laurence Spates, MD;  [facility]: [facility];  Service: Endoscopy;  Laterality: N/A;    There were no vitals filed for this visit.  Visit Diagnosis:  Difficulty walking  At high risk for falls  Right hip pain      Subjective Assessment - 06/09/15 1121    Subjective Patient reports doing better. She reports compliance with HEP; Reports having a little soreness in hip last night but she believes it could be related to how she slept; Denies any  pain today; Reports that she has been walking at home without her cane;    Pertinent History personal factors affecting rehab: anxiety/depression with Bipolar disorder, obesity, high fall risk;    Limitations Sitting;Standing;Walking   How long can you sit comfortably? 10-15 min due to hip pain   How long can you stand comfortably? 5 min due to hip pain;    How long can you walk comfortably? about 100 feet with quad cane limited with hip pain;    Patient Stated Goals be able to squat and stand up without problem; reduce right hip pain; be able to walk and sit longer periods of time;    Currently in Pain? No/denies            [facility] PT Assessment - 06/09/15 0001    Standardized Balance Assessment   10 Meter Walk 1.05 m/s with poles for cues for reciprocal arm swing;         TREATMENT  Pt ambulated without AD, x80 feet, close supervision with min VCs to increase step length and cues to increase erect posture, increasing scanning of environment for better gait safety;   Ambulated 40 feet x5 laps with and without poles for cues for increased reciprocal arm swing and increased step length; Patient  reports no increase in pain or fatigue and was able to demonstrate better step length and gait speed; 10 meter walk speed after gait training was 1.05 with poles for cues with reciprocal arm swing; Nustep level 3, BUE/BLE x 5 min (unbilled);   Sit<>stand from low mat table with BUE yellow weighted ball overhead lift x12; Standing hip strengthening (with green tband): Hip abduction x 10 bilaterally with mod VCs to avoid hip ER and reduce trunk lean for better hip strengthening; Hip extension x 10 bilaterally with cues to avoid forward lean for better hip extension; Side stepping 10 feet x2 laps each direction;  Standing exercises: Heel raises x15; Mini squat  x15 reps with min Vcs to increase posterior hip for better positioning;  Patient very slow with exercise and required increased cues  for correct technique                      PT Education - 06/09/15 1122    Education provided Yes   Education Details exercise, strengthening;    Person(s) Educated Patient   Methods Explanation;Verbal cues   Comprehension Verbalized understanding;Returned demonstration;Verbal cues required             PT Long Term Goals - 05/26/15 1341    PT LONG TERM GOAL #1   Title Patient will be independent in home exercise program to improve strength/mobility for better functional independence with ADLs. by 07/21/15   Time 8   Period Weeks   Status New   PT LONG TERM GOAL #2   Title Patient (> 75 years old) will complete five times sit to stand test in < 15 seconds indicating an increased LE strength and improved balance. by 07/21/15   Time 8   Period Weeks   Status New   PT LONG TERM GOAL #3   Title Patient will increase 10 meter walk test to >1.103m/s as to improve gait speed for better community ambulation and to reduce fall risk. by 07/21/15   Time 8   Period Weeks   Status New   PT LONG TERM GOAL #4   Title Patient will report a worst pain of 3/10 on VAS in     right hip        to improve tolerance with ADLs and reduced symptoms with activities. by 07/21/15   Time 8   Period Weeks   Status New   PT LONG TERM GOAL #5   Title Patient will increase lower extremity functional scale to >40/80 to demonstrate improved functional mobility and increased tolerance with ADLs. by 07/21/15   Time 8   Period Weeks   Status New               Plan - 06/09/15 1309    Clinical Impression Statement Patient demonstrates significant improvement in gait ability; She is able to ambulate longer distances without AD at faster gait speed. Patient was also instructed in advanced LE strengthening. Despite prolonged standing and activity, patient reports no increase in right hip pain. She does continue to be slower with movement and requires increased rest breaks. She would benefit from  additional skilled PT intervention to improve LE strength and functional mobility;    Pt will benefit from skilled therapeutic intervention in order to improve on the following deficits Abnormal gait;Decreased endurance;Obesity;Cardiopulmonary status limiting activity;Decreased activity tolerance;Decreased strength;Pain;Difficulty walking;Decreased mobility;Decreased balance;Improper body mechanics;Impaired flexibility;Decreased safety awareness   Rehab Potential Fair   Clinical Impairments Affecting Rehab Potential positive: husband  supportive, negative: co-morbidities; patient's presentation is evolving as her hip pain is up/down and her high fall risk really limits her mobility;    PT Frequency 2x / week   PT Duration 8 weeks   PT Treatment/Interventions ADLs/Self Care Home Management;Cryotherapy;Electrical Stimulation;Iontophoresis 4mg /ml Dexamethasone;Moist Heat;Balance training;Therapeutic exercise;Therapeutic activities;Functional mobility training;Stair training;Gait training;DME Instruction;Ultrasound;Neuromuscular re-education;Patient/family education;Manual techniques;Taping;Energy conservation;Dry needling   PT Next Visit Plan work on strengthening, balance;    PT Home Exercise Plan advanced- see patient instructions;    Consulted and Agree with Plan of Care Patient        Problem List Patient Active Problem List   Diagnosis Date Noted  . DDD (degenerative disc disease), lumbar 08/29/2014  . Lumbar facet arthropathy 08/29/2014  . Status post lumbar laminectomy 08/29/2014  . Sacroiliac joint disease 08/29/2014  . Seasonal affective disorder ([place]) 07/14/2014  . Mood altered ([place]) 07/14/2014    Irelyn Perfecto PT, DPT 06/09/2015, 1:10 PM  [place] MAIN [facility] SERVICES [address] [place], [place], [address] Phone: [phone]   Fax:  [phone]  Name: Barbara Spencer MRN: [id number] Date of Birth: 1955/12/30

## 2015-06-13 ENCOUNTER — Ambulatory Visit: Payer: PPO | Admitting: Physical Therapy

## 2015-06-13 ENCOUNTER — Encounter: Payer: Self-pay | Admitting: Physical Therapy

## 2015-06-13 DIAGNOSIS — M25551 Pain in right hip: Secondary | ICD-10-CM

## 2015-06-13 DIAGNOSIS — Z9181 History of falling: Secondary | ICD-10-CM

## 2015-06-13 DIAGNOSIS — R262 Difficulty in walking, not elsewhere classified: Secondary | ICD-10-CM

## 2015-06-13 NOTE — Therapy (Signed)
[place] MAIN [facility] SERVICES [address] [place], [place], [address] Phone: [phone]   Fax:  [phone]  Physical Therapy Treatment  Patient Details  Name: Barbara Spencer MRN: [id number] Date of Birth: [birth year] Referring Provider: Myrtie Hawk   Encounter Date: 06/13/2015      PT End of Session - 06/13/15 1317    Visit Number 5   Number of Visits 17   Date for PT Re-Evaluation 07/21/15   PT Start Time 1000   PT Stop Time 1058   PT Time Calculation (min) 58 min   Equipment Utilized During Treatment Gait belt   Activity Tolerance Patient tolerated treatment well;No increased pain   Behavior During Therapy [facility] for tasks assessed/performed      Past Medical History  Diagnosis Date  . Bipolar disorder ([place])   . Thyroid disease   . Diabetes mellitus, type II ([place])   . HTN (hypertension)   . Sleep apnea   . Migraines   . Aortic stenosis   . Tachycardia   . Obesity   . Heart murmur   . Seasonal affective disorder ([place])     under control  . Anxiety     managing well with medication  . DDD (degenerative disc disease), lumbar     back and hip    Past Surgical History  Procedure Laterality Date  . Cholecystectomy    . Cesarean section    . Appendectomy    . Cardiac catheterization      x3 -all clear  . Hernia repair      abdominal hernia repair after gallbladder surgery  . Colonoscopy with propofol N/A 02/03/2015    Procedure: COLONOSCOPY WITH PROPOFOL;  Surgeon: Laurence Spates, MD;  [facility];  Service: Endoscopy;  Laterality: N/A;    There were no vitals filed for this visit.  Visit Diagnosis:  Difficulty walking  At high risk for falls  Right hip pain      Subjective Assessment - 06/13/15 1004    Subjective Patient reports feeling increased soreness in right hip in the morning when she wakes up; She feels that is related to way she sleeps; Patient reports no pain this morning. She  reports walking at home this weekend without difficulty;    Pertinent History personal factors affecting rehab: anxiety/depression with Bipolar disorder, obesity, high fall risk;    Limitations Sitting;Standing;Walking   How long can you sit comfortably? 10-15 min due to hip pain   How long can you stand comfortably? 5 min due to hip pain;    How long can you walk comfortably? about 100 feet with quad cane limited with hip pain;    Patient Stated Goals be able to squat and stand up without problem; reduce right hip pain; be able to walk and sit longer periods of time;    Currently in Pain? No/denies      TREATMENT Warm up, Nustep level 3, BUE/BLE x 5 min (unbilled);   Pt ambulated without AD, x80 feet x3, close supervision with min VCs to increase step length and cues to increase erect posture, increasing scanning of environment for better gait safety;  Ascend/descend 4 steps with B rail assist x2 reps, ascending: forward reciprocal, descending: Forward non-reciprocal;  Forward/backward walking 10 feet with 1 rail assist x5 laps with mod VCs to increase step length especially with backwards walking;   After initial walking, patient reports increased left knee pain; Instructed patient in supine/hooklying exercise for less discomfort:  Supine: SLR flexion x10 bilaterally with min VCs to avoid hip ER and to bend opposite LE for better positioning; hooklying lumbar trunk rotation x1 min each direction; Hoolying with green tband around both legs: Hip abduction/ER 2x15 bilaterally; required mod VCs to increase right hip abduction for better hip strengthening; Hip flexion march x15 bilaterally with min tactile cues and mod VCS to increase ROM for better strengthening; BLE bridges x10 with cues to increase ROM for better hip extensor strengthening;  After hooklying exercise, patient reports no pain and tolerated the rest of her session well.  Sit<>stand from low mat table with BUE yellow  weighted ball overhead lift x15;   Standing hip strengthening (with green tband): Side stepping 10 feet x2 laps each direction;  Standing exercises: Heel raises x15 with mod VCS to avoid knee flexion and increase push through toes for better calf strengthening;  Patient very slow with exercise and required increased cues for correct technique; She required min A for transitioning supine<>sitting with cues for foot and hand placement;                              PT Education - 06/13/15 1317    Education provided Yes   Education Details strengthening, gait safety;    Person(s) Educated Patient   Methods Explanation;Verbal cues   Comprehension Verbalized understanding;Returned demonstration;Verbal cues required             PT Long Term Goals - 05/26/15 1341    PT LONG TERM GOAL #1   Title Patient will be independent in home exercise program to improve strength/mobility for better functional independence with ADLs. by 07/21/15   Time 8   Period Weeks   Status New   PT LONG TERM GOAL #2   Title Patient (> 54 years old) will complete five times sit to stand test in < 15 seconds indicating an increased LE strength and improved balance. by 07/21/15   Time 8   Period Weeks   Status New   PT LONG TERM GOAL #3   Title Patient will increase 10 meter walk test to >1.45m/s as to improve gait speed for better community ambulation and to reduce fall risk. by 07/21/15   Time 8   Period Weeks   Status New   PT LONG TERM GOAL #4   Title Patient will report a worst pain of 3/10 on VAS in     right hip        to improve tolerance with ADLs and reduced symptoms with activities. by 07/21/15   Time 8   Period Weeks   Status New   PT LONG TERM GOAL #5   Title Patient will increase lower extremity functional scale to >40/80 to demonstrate improved functional mobility and increased tolerance with ADLs. by 07/21/15   Time 8   Period Weeks   Status New                Plan - 06/13/15 1317    Clinical Impression Statement Instructed patient in advanced BLE strengthening. Patient continues to require mod Vcs to increase ROM for better strengthening. She tolerated advanced exercise well. Patient reports no pain after treatment session. She does fatigue quickly requiring increased short rest breaks. Would benefit from additional skilled PT Intervention to improve LE strength/balance and reduce fall risk;    Pt will benefit from skilled therapeutic intervention in order to improve on the following deficits Abnormal  gait;Decreased endurance;Obesity;Cardiopulmonary status limiting activity;Decreased activity tolerance;Decreased strength;Pain;Difficulty walking;Decreased mobility;Decreased balance;Improper body mechanics;Impaired flexibility;Decreased safety awareness   Rehab Potential Fair   Clinical Impairments Affecting Rehab Potential positive: husband supportive, negative: co-morbidities; patient's presentation is evolving as her hip pain is up/down and her high fall risk really limits her mobility;    PT Frequency 2x / week   PT Duration 8 weeks   PT Treatment/Interventions ADLs/Self Care Home Management;Cryotherapy;Electrical Stimulation;Iontophoresis 4mg /ml Dexamethasone;Moist Heat;Balance training;Therapeutic exercise;Therapeutic activities;Functional mobility training;Stair training;Gait training;DME Instruction;Ultrasound;Neuromuscular re-education;Patient/family education;Manual techniques;Taping;Energy conservation;Dry needling   PT Next Visit Plan work on strengthening, balance;    PT Home Exercise Plan continue as previously given;    Consulted and Agree with Plan of Care Patient        Problem List Patient Active Problem List   Diagnosis Date Noted  . DDD (degenerative disc disease), lumbar 08/29/2014  . Lumbar facet arthropathy 08/29/2014  . Status post lumbar laminectomy 08/29/2014  . Sacroiliac joint disease 08/29/2014  . Seasonal affective  disorder ([place]) 07/14/2014  . Mood altered ([place]) 07/14/2014    Shannon Balthazar PT, DPT 06/13/2015, 1:19 PM  [place] MAIN [facility] SERVICES [address] [place], [place], [address] Phone: [phone]   Fax:  [phone]  Name: Barbara Spencer MRN: [id number] Date of Birth: [birth year]

## 2015-06-15 ENCOUNTER — Ambulatory Visit
Admission: RE | Admit: 2015-06-15 | Discharge: 2015-06-15 | Disposition: A | Discharge status: Home / Self Care | Payer: PPO | Source: Ambulatory Visit | Attending: Internal Medicine | Admitting: Internal Medicine

## 2015-06-15 ENCOUNTER — Ambulatory Visit: Payer: PPO | Admitting: Physical Therapy

## 2015-06-15 ENCOUNTER — Other Ambulatory Visit: Payer: Self-pay | Admitting: Internal Medicine

## 2015-06-15 ENCOUNTER — Encounter: Payer: Self-pay | Admitting: Physical Therapy

## 2015-06-15 DIAGNOSIS — Z1231 Encounter for screening mammogram for malignant neoplasm of breast: Secondary | ICD-10-CM | POA: Diagnosis not present

## 2015-06-15 DIAGNOSIS — M25551 Pain in right hip: Secondary | ICD-10-CM | POA: Diagnosis not present

## 2015-06-15 DIAGNOSIS — R262 Difficulty in walking, not elsewhere classified: Secondary | ICD-10-CM

## 2015-06-15 DIAGNOSIS — Z9181 History of falling: Secondary | ICD-10-CM

## 2015-06-15 NOTE — Therapy (Signed)
[place] MAIN [facility] SERVICES [address] [place], [place], [address] Phone: [phone]   Fax:  [phone]  Physical Therapy Treatment  Patient Details  Name: Barbara Spencer MRN: [id number] Date of Birth: 02-20-1956 Referring Provider: Myrtie Hawk   Encounter Date: 06/15/2015      PT End of Session - 06/15/15 1403    Visit Number 6   Number of Visits 17   Date for PT Re-Evaluation 07/21/15   PT Start Time 1110   PT Stop Time 1155   PT Time Calculation (min) 45 min   Equipment Utilized During Treatment Gait belt   Activity Tolerance Patient tolerated treatment well;No increased pain   Behavior During Therapy [facility] for tasks assessed/performed      Past Medical History  Diagnosis Date  . Bipolar disorder ([place])   . Thyroid disease   . Diabetes mellitus, type II ([place])   . HTN (hypertension)   . Sleep apnea   . Migraines   . Aortic stenosis   . Tachycardia   . Obesity   . Heart murmur   . Seasonal affective disorder ([place])     under control  . Anxiety     managing well with medication  . DDD (degenerative disc disease), lumbar     back and hip    Past Surgical History  Procedure Laterality Date  . Cholecystectomy    . Cesarean section    . Appendectomy    . Cardiac catheterization      x3 -all clear  . Hernia repair      abdominal hernia repair after gallbladder surgery  . Colonoscopy with propofol N/A 02/03/2015    Procedure: COLONOSCOPY WITH PROPOFOL;  Surgeon: Laurence Spates, MD;  Location: [facility];  Service: Endoscopy;  Laterality: N/A;    There were no vitals filed for this visit.  Visit Diagnosis:  Difficulty walking  At high risk for falls  Right hip pain      Subjective Assessment - 06/15/15 1114    Subjective Patient reports feeling increased soreness in left knee; She reports being fatigued today. "I'm not sure if I can do a lot of walking today. I feel weak"   Pertinent History  personal factors affecting rehab: anxiety/depression with Bipolar disorder, obesity, high fall risk;    Limitations Sitting;Standing;Walking   How long can you sit comfortably? 10-15 min due to hip pain   How long can you stand comfortably? 5 min due to hip pain;    How long can you walk comfortably? about 100 feet with quad cane limited with hip pain;    Patient Stated Goals be able to squat and stand up without problem; reduce right hip pain; be able to walk and sit longer periods of time;    Currently in Pain? Yes   Pain Score 1    Pain Location Knee   Pain Orientation Left   Pain Descriptors / Indicators Aching;Sore        TREATMENT: Warm up on Nustep BUE/BLE level 3 x5 min with cues to increase steps per minute to >50; Patient tends to stay around mid 30's due to slow movement/fatigue;   Pt ambulated without AD, x125 feet x2 reps, close supervision with min VCs to increase step length and cues to increase erect posture, increasing scanning of environment for better gait safety;   Forward step ups with B rail assist x10 with min Vcs to increase forward lean for better step ups;   Supine:  SLR flexion x15 bilaterally with min VCs to avoid hip ER and to bend opposite LE for better positioning; BLE bridges x15 with cues to increase ROM for better hip extensor strengthening and tactile cues to avoid hip abduction/ER; Sidelying hip abduction x10 bilaterally with mod VCs for lying on side and reduce hip ER for better hip abductor strengthening; Patient had significant difficulty with scooting on the table requiring mod VCs and tactile cues for LE and UE placement;  After hooklying exercise, patient reports no pain and tolerated the rest of her session well.  Sit<>stand from low mat table with BUE yellow weighted ball toss x15;    Standing hip strengthening (with green tband): Hip flexion march x10 bilaterally;  Standing exercises: Heel raises x15 with mod VCS to avoid knee flexion  and increase push through toes for better calf strengthening;  Patient very slow with exercise and required increased cues for correct technique; She required min A for transitioning supine<>sitting with cues for foot and hand placement;                           PT Education - 06/15/15 1402    Education provided Yes   Education Details strengthening, gait safety;   Person(s) Educated Patient   Methods Explanation;Verbal cues   Comprehension Verbalized understanding;Returned demonstration;Verbal cues required             PT Long Term Goals - 05/26/15 1341    PT LONG TERM GOAL #1   Title Patient will be independent in home exercise program to improve strength/mobility for better functional independence with ADLs. by 07/21/15   Time 8   Period Weeks   Status New   PT LONG TERM GOAL #2   Title Patient (> 60 years old) will complete five times sit to stand test in < 15 seconds indicating an increased LE strength and improved balance. by 07/21/15   Time 8   Period Weeks   Status New   PT LONG TERM GOAL #3   Title Patient will increase 10 meter walk test to >1.74m/s as to improve gait speed for better community ambulation and to reduce fall risk. by 07/21/15   Time 8   Period Weeks   Status New   PT LONG TERM GOAL #4   Title Patient will report a worst pain of 3/10 on VAS in     right hip        to improve tolerance with ADLs and reduced symptoms with activities. by 07/21/15   Time 8   Period Weeks   Status New   PT LONG TERM GOAL #5   Title Patient will increase lower extremity functional scale to >40/80 to demonstrate improved functional mobility and increased tolerance with ADLs. by 07/21/15   Time 8   Period Weeks   Status New               Plan - 06/15/15 1403    Clinical Impression Statement patient reports increased soreness in left knee with prolonged standing/walking; However with advanced exercise she denies any increase in pain. Patient  very fatigued after exercise today. She would benefit from additional skilled PT intervention to improve LE strength, balance and gait safety;    Pt will benefit from skilled therapeutic intervention in order to improve on the following deficits Abnormal gait;Decreased endurance;Obesity;Cardiopulmonary status limiting activity;Decreased activity tolerance;Decreased strength;Pain;Difficulty walking;Decreased mobility;Decreased balance;Improper body mechanics;Impaired flexibility;Decreased safety awareness   Rehab Potential Fair  Clinical Impairments Affecting Rehab Potential positive: husband supportive, negative: co-morbidities; patient's presentation is evolving as her hip pain is up/down and her high fall risk really limits her mobility;    PT Frequency 2x / week   PT Duration 8 weeks   PT Treatment/Interventions ADLs/Self Care Home Management;Cryotherapy;Electrical Stimulation;Iontophoresis 4mg /ml Dexamethasone;Moist Heat;Balance training;Therapeutic exercise;Therapeutic activities;Functional mobility training;Stair training;Gait training;DME Instruction;Ultrasound;Neuromuscular re-education;Patient/family education;Manual techniques;Taping;Energy conservation;Dry needling   PT Next Visit Plan work on strengthening, balance;    PT Home Exercise Plan continue as previously given;    Consulted and Agree with Plan of Care Patient        Problem List Patient Active Problem List   Diagnosis Date Noted  . DDD (degenerative disc disease), lumbar 08/29/2014  . Lumbar facet arthropathy 08/29/2014  . Status post lumbar laminectomy 08/29/2014  . Sacroiliac joint disease 08/29/2014  . Seasonal affective disorder ([place]) 07/14/2014  . Mood altered ([place]) 07/14/2014    Trotter,Margaret PT, DPT 06/15/2015, 2:04 PM  [place] MAIN [facility] SERVICES [address] [place], [place], [address] Phone: [phone]   Fax:  [phone]  Name: Addaleigh EMELLY GOYAL MRN:  [id number] Date of Birth: 01-08-1956

## 2015-06-20 ENCOUNTER — Ambulatory Visit: Payer: PPO | Admitting: Physical Therapy

## 2015-06-20 ENCOUNTER — Encounter: Payer: Self-pay | Admitting: Physical Therapy

## 2015-06-20 DIAGNOSIS — M25551 Pain in right hip: Secondary | ICD-10-CM | POA: Diagnosis not present

## 2015-06-20 DIAGNOSIS — Z9181 History of falling: Secondary | ICD-10-CM

## 2015-06-20 DIAGNOSIS — R262 Difficulty in walking, not elsewhere classified: Secondary | ICD-10-CM

## 2015-06-20 NOTE — Therapy (Signed)
[place] MAIN [facility] SERVICES [address] [place], [place], [address] Phone: [phone]   Fax:  [phone]  Physical Therapy Treatment  Patient Details  Name: Barbara Spencer MRN: [id number] Date of Birth: [birth year] Referring Provider: Myrtie Hawk   Encounter Date: 06/20/2015      PT End of Session - 06/20/15 1233    Visit Number 7   Number of Visits 17   Date for PT Re-Evaluation 07/21/15   PT Start Time [id number]   PT Stop Time 1057   PT Time Calculation (min) 42 min   Activity Tolerance Patient tolerated treatment well;No increased pain   Behavior During Therapy [facility] for tasks assessed/performed      Past Medical History  Diagnosis Date  . Bipolar disorder ([place])   . Thyroid disease   . Diabetes mellitus, type II ([place])   . HTN (hypertension)   . Sleep apnea   . Migraines   . Aortic stenosis   . Tachycardia   . Obesity   . Heart murmur   . Seasonal affective disorder ([place])     under control  . Anxiety     managing well with medication  . DDD (degenerative disc disease), lumbar     back and hip    Past Surgical History  Procedure Laterality Date  . Cholecystectomy    . Cesarean section    . Appendectomy    . Cardiac catheterization      x3 -all clear  . Hernia repair      abdominal hernia repair after gallbladder surgery  . Colonoscopy with propofol N/A 02/03/2015    Procedure: COLONOSCOPY WITH PROPOFOL;  Surgeon: Laurence Spates, MD;  [facility]: [facility];  Service: Endoscopy;  Laterality: N/A;    There were no vitals filed for this visit.  Visit Diagnosis:  Difficulty walking  At high risk for falls  Right hip pain      Subjective Assessment - 06/20/15 1030    Subjective Patient reports doing well. She is fatigued today; She reports no LE pain at all over the weekend,but reports increased unsteadiness with initial gait outside.    Pertinent History personal factors affecting rehab:  anxiety/depression with Bipolar disorder, obesity, high fall risk;    Limitations Sitting;Standing;Walking   How long can you sit comfortably? 10-15 min due to hip pain   How long can you stand comfortably? 5 min due to hip pain;    How long can you walk comfortably? about 100 feet with quad cane limited with hip pain;    Patient Stated Goals be able to squat and stand up without problem; reduce right hip pain; be able to walk and sit longer periods of time;    Currently in Pain? No/denies        TREATMENT:  Standing: Heel/toe raises x10 reps; Mini squats with mod VCs to increase posterior hip lean for better technique x10 reps;  Pt ambulated without AD, x120 feet x2 reps, close supervision with min VCs to increase step length and cues to increase erect posture, increasing scanning of environment for better gait safety;   Forward/backward walking 10 feet without rail assist  x3 laps with mod VCs to increase step length especially with backwards walking and improve forward weight shift for better balance control;  Forward lunges with 1 rail assist, x10 each foot leading with mod VCs for technique, increase step length, increase push back through LE for better balance control and strengthening;  Sit<>Stand with yellow  weighted ball toss from regular chair x10 reps with min VCs to increase throw through BUE for better UE strengthening;  Standing red tband around both LE: Hip abduction x15 bilaterally; Hip extension x15 bilaterally; Patient requires mod VCs to slow down LE movement and to improve foot positioning for better hip strengthening;                           PT Education - 06/20/15 1232    Education provided Yes   Education Details strengthening, gait safety;    Person(s) Educated Patient   Methods Explanation;Verbal cues   Comprehension Verbalized understanding;Returned demonstration;Verbal cues required             PT Long Term Goals -  05/26/15 1341    PT LONG TERM GOAL #1   Title Patient will be independent in home exercise program to improve strength/mobility for better functional independence with ADLs. by 07/21/15   Time 8   Period Weeks   Status New   PT LONG TERM GOAL #2   Title Patient (> 67 years old) will complete five times sit to stand test in < 15 seconds indicating an increased LE strength and improved balance. by 07/21/15   Time 8   Period Weeks   Status New   PT LONG TERM GOAL #3   Title Patient will increase 10 meter walk test to >1.41m/s as to improve gait speed for better community ambulation and to reduce fall risk. by 07/21/15   Time 8   Period Weeks   Status New   PT LONG TERM GOAL #4   Title Patient will report a worst pain of 3/10 on VAS in     right hip        to improve tolerance with ADLs and reduced symptoms with activities. by 07/21/15   Time 8   Period Weeks   Status New   PT LONG TERM GOAL #5   Title Patient will increase lower extremity functional scale to >40/80 to demonstrate improved functional mobility and increased tolerance with ADLs. by 07/21/15   Time 8   Period Weeks   Status New               Plan - 06/20/15 1234    Clinical Impression Statement Instructed patient in advanced BLE strengthening. Patient exhibits increased fatigue with prolonged standing requiring increased rest breaks. She was very tired at end of treatment session having difficulty with gait task. Patient admits that she is not active at home and will sit for 5-6 hours at a time at home. PT re-educated patient in HEP and recommended that she start participating with more household chores for increased mobility. She would benefit from additional skilled PT Intervention to improve LE strength, balance and gait safety;    Pt will benefit from skilled therapeutic intervention in order to improve on the following deficits Abnormal gait;Decreased endurance;Obesity;Cardiopulmonary status limiting activity;Decreased  activity tolerance;Decreased strength;Pain;Difficulty walking;Decreased mobility;Decreased balance;Improper body mechanics;Impaired flexibility;Decreased safety awareness   Rehab Potential Fair   Clinical Impairments Affecting Rehab Potential positive: husband supportive, negative: co-morbidities; patient's presentation is evolving as her hip pain is up/down and her high fall risk really limits her mobility;    PT Frequency 2x / week   PT Duration 8 weeks   PT Treatment/Interventions ADLs/Self Care Home Management;Cryotherapy;Electrical Stimulation;Iontophoresis 4mg /ml Dexamethasone;Moist Heat;Balance training;Therapeutic exercise;Therapeutic activities;Functional mobility training;Stair training;Gait training;DME Instruction;Ultrasound;Neuromuscular re-education;Patient/family education;Manual techniques;Taping;Energy conservation;Dry needling   PT Next Visit  Plan work on strengthening, balance;    PT Home Exercise Plan continue as previously given;    Consulted and Agree with Plan of Care Patient        Problem List Patient Active Problem List   Diagnosis Date Noted  . DDD (degenerative disc disease), lumbar 08/29/2014  . Lumbar facet arthropathy 08/29/2014  . Status post lumbar laminectomy 08/29/2014  . Sacroiliac joint disease 08/29/2014  . Seasonal affective disorder ([place]) 07/14/2014  . Mood altered ([place]) 07/14/2014    Fabrizzio Marcella PT, DPT 06/20/2015, 12:41 PM  [place] MAIN [facility] SERVICES [address] [place], [place], [address] Phone: [phone]   Fax:  [phone]  Name: Barbara Spencer MRN: [id number] Date of Birth: Jul 12, 1955

## 2015-06-23 ENCOUNTER — Ambulatory Visit: Payer: PPO | Admitting: Physical Therapy

## 2015-06-23 ENCOUNTER — Encounter: Payer: Self-pay | Admitting: Physical Therapy

## 2015-06-23 DIAGNOSIS — R262 Difficulty in walking, not elsewhere classified: Secondary | ICD-10-CM

## 2015-06-23 DIAGNOSIS — M25551 Pain in right hip: Secondary | ICD-10-CM

## 2015-06-23 DIAGNOSIS — Z9181 History of falling: Secondary | ICD-10-CM

## 2015-06-23 NOTE — Therapy (Signed)
[place] MAIN [facility] SERVICES [address] [place], [place], [address] Phone: [phone]   Fax:  [phone]  Physical Therapy Treatment/Progress Note  Patient Details  Name: Barbara Spencer MRN: [id number] Date of Birth: [birth year] Referring Provider: Myrtie Hawk   Encounter Date: 06/23/2015      PT End of Session - 06/23/15 6301    Visit Number 8   Number of Visits 17   Date for PT Re-Evaluation 07/21/15   PT Start Time 0845   PT Stop Time 0930   PT Time Calculation (min) 45 min   Activity Tolerance Patient tolerated treatment well;No increased pain   Behavior During Therapy [facility] for tasks assessed/performed      Past Medical History  Diagnosis Date  . Bipolar disorder ([place])   . Thyroid disease   . Diabetes mellitus, type II ([place])   . HTN (hypertension)   . Sleep apnea   . Migraines   . Aortic stenosis   . Tachycardia   . Obesity   . Heart murmur   . Seasonal affective disorder ([place])     under control  . Anxiety     managing well with medication  . DDD (degenerative disc disease), lumbar     back and hip    Past Surgical History  Procedure Laterality Date  . Cholecystectomy    . Cesarean section    . Appendectomy    . Cardiac catheterization      x3 -all clear  . Hernia repair      abdominal hernia repair after gallbladder surgery  . Colonoscopy with propofol N/A 02/03/2015    Procedure: COLONOSCOPY WITH PROPOFOL;  Surgeon: Laurence Spates, MD;  [facility];  Service: Endoscopy;  Laterality: N/A;    There were no vitals filed for this visit.  Visit Diagnosis:  Difficulty walking  At high risk for falls  Right hip pain      Subjective Assessment - 06/23/15 0851    Subjective Patient reports not feeling too good this morning having trouble with her sugar levels. She reports eating a breakfast bar; Her husband reports that her sugar levels are low in the morning;    Pertinent History  personal factors affecting rehab: anxiety/depression with Bipolar disorder, obesity, high fall risk;    Limitations Sitting;Standing;Walking   How long can you sit comfortably? 10-15 min due to hip pain   How long can you stand comfortably? 5 min due to hip pain;    How long can you walk comfortably? about 100 feet with quad cane limited with hip pain;    Patient Stated Goals be able to squat and stand up without problem; reduce right hip pain; be able to walk and sit longer periods of time;    Currently in Pain? No/denies            [facility] PT Assessment - 06/23/15 0001    Observation/Other Assessments   Lower Extremity Functional Scale  34/80 (the lower the score the greater the disability; improved from initial eval on 05/26/15 which was 14/80   Standardized Balance Assessment   Five times sit to stand comments  16 sec without HHA (>15 sec indicates increased risk for falls; improved from initial eval on 05/26/15 which was 31 sec without HHA   10 Meter Walk 1.05 m/s without AD; community ambulator, improved from initial eval which was 0.37 m/s with [facility]         TREATMENT: Warm up on Nustep BUE/BLE  level 2 x4 min with cues to increase ROM and increase steps per minute to >40; Patient continues to be concerned about sugar levels; PT assessed glucose level which was 163; Patient reports that maybe she is just too tired which is why she doesn't feel the greatest;   Instructed patient in 10 meter walk, 5 times sit<>stand, LEFs to assess progress towards goals; See above;   Pt ambulated without AD, x120 feet x2 reps, close supervision with min VCs to increase step length and cues to increase erect posture, increasing scanning of environment for better gait safety;   Modified tandem stance, with 1-0 rail assist x5 sec each foot in front x2 reps;  Patient required min VCs for balance stability, including to increase trunk control for less loss of balance with smaller base of  support                      PT Education - 06/23/15 0922    Education provided Yes   Education Details progress towards goals, gait safety;    Person(s) Educated Patient   Methods Explanation;Verbal cues   Comprehension Verbalized understanding;Returned demonstration;Verbal cues required             PT Long Term Goals - 06/23/15 0855    PT LONG TERM GOAL #1   Title Patient will be independent in home exercise program to improve strength/mobility for better functional independence with ADLs. by 07/21/15   Time 8   Period Weeks   Status On-going   PT LONG TERM GOAL #2   Title Patient (> 42 years old) will complete five times sit to stand test in < 15 seconds indicating an increased LE strength and improved balance. by 07/21/15   Time 8   Period Weeks   Status Partially Met   PT LONG TERM GOAL #3   Title Patient will increase 10 meter walk test to >1.34ms as to improve gait speed for better community ambulation and to reduce fall risk. by 07/21/15   Time 8   Period Weeks   Status Achieved   PT LONG TERM GOAL #4   Title Patient will report a worst pain of 3/10 on VAS in     right hip        to improve tolerance with ADLs and reduced symptoms with activities. by 07/21/15   Time 8   Period Weeks   Status Achieved   PT LONG TERM GOAL #5   Title Patient will increase lower extremity functional scale to >40/80 to demonstrate improved functional mobility and increased tolerance with ADLs. by 07/21/15   Time 8   Period Weeks   Status Partially Met               Plan - 06/23/15 0924    Clinical Impression Statement Instructed patient in gait safety on even surface without AD; She was able to demonstrate better foot clearance and longer step length. At times she will start walking too fast and want to fall forward. She continues to fatigue quickly requiring frequent rest breaks. PT assessed patient's progress towards goals. She has met some goals with improved  gait speed and less hip pain; She almost met other goals. Would benefit from additional skilled PT intervention to improve balance/gait safety;    Pt will benefit from skilled therapeutic intervention in order to improve on the following deficits Abnormal gait;Decreased endurance;Obesity;Cardiopulmonary status limiting activity;Decreased activity tolerance;Decreased strength;Pain;Difficulty walking;Decreased mobility;Decreased balance;Improper body mechanics;Impaired flexibility;Decreased safety awareness  Rehab Potential Fair   Clinical Impairments Affecting Rehab Potential positive: husband supportive, negative: co-morbidities; patient's presentation is evolving as her hip pain is up/down and her high fall risk really limits her mobility;    PT Frequency 2x / week   PT Duration 8 weeks   PT Treatment/Interventions ADLs/Self Care Home Management;Cryotherapy;Electrical Stimulation;Iontophoresis 71m/ml Dexamethasone;Moist Heat;Balance training;Therapeutic exercise;Therapeutic activities;Functional mobility training;Stair training;Gait training;DME Instruction;Ultrasound;Neuromuscular re-education;Patient/family education;Manual techniques;Taping;Energy conservation;Dry needling   PT Next Visit Plan work on strengthening, balance;    PT Home Exercise Plan continue as previously given;    Consulted and Agree with Plan of Care Patient          G-Codes - [id number]    Functional Assessment Tool Used Clinical judgement, 5 times sit<>stand, 10 meter walk   Functional Limitation Mobility: Walking and moving around   Mobility: Walking and Moving Around Current Status [phone] At least 40 percent but less than 60 percent impaired, limited or restricted   Mobility: Walking and Moving Around Goal Status [phone] At least 20 percent but less than 40 percent impaired, limited or restricted      Problem List Patient Active Problem List   Diagnosis Date Noted  . DDD (degenerative disc disease), lumbar  08/29/2014  . Lumbar facet arthropathy 08/29/2014  . Status post lumbar laminectomy 08/29/2014  . Sacroiliac joint disease 08/29/2014  . [place]al affective disorder ([place] 07/14/2014  . Mood altered ([place] 07/14/2014    Antonieta Slaven PT, DPT 3April 13, 2017 3:33 PM  [place] [facility] [address] [address]Phone: [phone]  Fax:  [phone] Name: Barbara MTRUDA STAUBMRN: [id number] of Birth: [birth year]

## 2015-06-28 ENCOUNTER — Encounter: Payer: Self-pay | Admitting: Physical Therapy

## 2015-06-28 ENCOUNTER — Ambulatory Visit: Payer: PPO | Attending: Internal Medicine | Admitting: Physical Therapy

## 2015-06-28 DIAGNOSIS — R296 Repeated falls: Secondary | ICD-10-CM | POA: Insufficient documentation

## 2015-06-28 DIAGNOSIS — R262 Difficulty in walking, not elsewhere classified: Secondary | ICD-10-CM | POA: Insufficient documentation

## 2015-06-28 DIAGNOSIS — M6281 Muscle weakness (generalized): Secondary | ICD-10-CM | POA: Insufficient documentation

## 2015-06-28 DIAGNOSIS — Z9181 History of falling: Secondary | ICD-10-CM

## 2015-06-28 DIAGNOSIS — M25551 Pain in right hip: Secondary | ICD-10-CM | POA: Insufficient documentation

## 2015-06-28 NOTE — Patient Instructions (Addendum)
Heel / Toe    Walk so that the heel of each foot comes down first, work on taking big steps and picking toes up; Also try to stand up straight;      Copyright  VHI. All rights reserved.  Toe / Heel Raise (Sitting)  DO THIS BEFORE GETTING UP OUT OF THE CHAIR Sitting, raise heels, then rock back on heels and raise toes. Repeat _10___ times.  Copyright  VHI. All rights reserved.

## 2015-06-28 NOTE — Therapy (Signed)
[place] MAIN [facility] SERVICES [address] [place], [place], [address] Phone: [phone]   Fax:  [phone]  Physical Therapy Treatment  Patient Details  Name: Barbara Spencer MRN: [id number] Date of Birth: Dec 23, 1955 Referring Provider: Myrtie Hawk   Encounter Date: 06/28/2015      PT End of Session - 06/28/15 1106    Visit Number 9   Number of Visits 17   Date for PT Re-Evaluation 07/21/15   PT Start Time 1030   PT Stop Time 1115   PT Time Calculation (min) 45 min   Equipment Utilized During Treatment Gait belt   Activity Tolerance Patient tolerated treatment well;No increased pain   Behavior During Therapy [facility] for tasks assessed/performed      Past Medical History  Diagnosis Date  . Bipolar disorder ([place])   . Thyroid disease   . Diabetes mellitus, type II ([place])   . HTN (hypertension)   . Sleep apnea   . Migraines   . Aortic stenosis   . Tachycardia   . Obesity   . Heart murmur   . Seasonal affective disorder ([place])     under control  . Anxiety     managing well with medication  . DDD (degenerative disc disease), lumbar     back and hip    Past Surgical History  Procedure Laterality Date  . Cholecystectomy    . Cesarean section    . Appendectomy    . Cardiac catheterization      x3 -all clear  . Hernia repair      abdominal hernia repair after gallbladder surgery  . Colonoscopy with propofol N/A 02/03/2015    Procedure: COLONOSCOPY WITH PROPOFOL;  Surgeon: Laurence Spates, MD;  Location: [facility];  Service: Endoscopy;  Laterality: N/A;    There were no vitals filed for this visit.  Visit Diagnosis:  Difficulty walking  At high risk for falls  Right hip pain      Subjective Assessment - 06/28/15 1037    Subjective Patient reports feeling okay; She exhibits increased right lateral trunk lean with standing and gait tasks this week, "I don't know why I keep leaning to the right and why I start  walking too fast" Patient denies any new falls;    Pertinent History personal factors affecting rehab: anxiety/depression with Bipolar disorder, obesity, high fall risk;    Limitations Sitting;Standing;Walking   How long can you sit comfortably? 10-15 min due to hip pain   How long can you stand comfortably? 5 min due to hip pain;    How long can you walk comfortably? about 100 feet with quad cane limited with hip pain;    Patient Stated Goals be able to squat and stand up without problem; reduce right hip pain; be able to walk and sit longer periods of time;    Currently in Pain? No/denies            TREATMENT: Warm up on Nustep BUE/BLE level 3 x6 min (unbilled);  Pt ambulated without AD, x120 feet x1 reps, required min A and exhibiting increased right trunk lean with narrow base of support and decreased foot clearance; She required mod VCs to increase erect posture, increase step length and work on increasing heel strike for better balance control; Patient actually required [facility] assistance to be able to get into gym area due to imbalance; Gait after instruction for increased heel strike x40 feet with less trunk lean and better balance control, no  AD, CGA;  Seated toe raises x15 with cues to increase ROM:  Standing: Walking in parallel bars with rail assist, working on increasing heel strike with visual and verbal cues x2 laps; Standing on rockerboard, forward/backward teeter with cues for weight shift with 2 rail assist to improve forward/backward positioning;  Sit<>Stand from mat table with yellow weighted ball BUE overhead lift x10 reps with tactile cues to increase UE movement and to increase stand up for better strengthening;  Weaving around cones #6 x2 reps with cues to increase step length, increase erect posture and increase turning for  Better balance control; Patient had difficulty turning to left with increased right lateral trunk lean; Patient had significant difficulty with  2nd repetition with increased toe drag and difficulty negotiating tight spaces/obstacles;    Advanced HEP- see patient instructions with cues to improve gait safety with better heel strike;                    PT Education - 06/28/15 1106    Education provided Yes   Education Details gait safety, balance exercise   Person(s) Educated Patient   Methods Explanation;Verbal cues   Comprehension Verbalized understanding;Returned demonstration;Verbal cues required             PT Long Term Goals - 06/23/15 0855    PT LONG TERM GOAL #1   Title Patient will be independent in home exercise program to improve strength/mobility for better functional independence with ADLs. by 07/21/15   Time 8   Period Weeks   Status On-going   PT LONG TERM GOAL #2   Title Patient (> 60 years old) will complete five times sit to stand test in < 15 seconds indicating an increased LE strength and improved balance. by 07/21/15   Time 8   Period Weeks   Status Partially Met   PT LONG TERM GOAL #3   Title Patient will increase 10 meter walk test to >1.62ms as to improve gait speed for better community ambulation and to reduce fall risk. by 07/21/15   Time 8   Period Weeks   Status Achieved   PT LONG TERM GOAL #4   Title Patient will report a worst pain of 3/10 on VAS in     right hip        to improve tolerance with ADLs and reduced symptoms with activities. by 07/21/15   Time 8   Period Weeks   Status Achieved   PT LONG TERM GOAL #5   Title Patient will increase lower extremity functional scale to >40/80 to demonstrate improved functional mobility and increased tolerance with ADLs. by 07/21/15   Time 8   Period Weeks   Status Partially Met               Plan - 06/28/15 1122    Clinical Impression Statement Instructed patient in gait safety with cues to work on increasing ankle DF at heel strike for better balance control; Patient demonstrates increased right lateral trunk lean and  forward flexion with prolonged standing requiring assistance to avoid loss of balance; she was able to demonstrate some improvement with cues but is very slow. Re-educated patient in HEP with cues to increase compliance for better strengthening and safety;    Pt will benefit from skilled therapeutic intervention in order to improve on the following deficits Abnormal gait;Decreased endurance;Obesity;Cardiopulmonary status limiting activity;Decreased activity tolerance;Decreased strength;Pain;Difficulty walking;Decreased mobility;Decreased balance;Improper body mechanics;Impaired flexibility;Decreased safety awareness   Rehab Potential Fair  Clinical Impairments Affecting Rehab Potential positive: husband supportive, negative: co-morbidities; patient's presentation is evolving as her hip pain is up/down and her high fall risk really limits her mobility;    PT Frequency 2x / week   PT Duration 8 weeks   PT Treatment/Interventions ADLs/Self Care Home Management;Cryotherapy;Electrical Stimulation;Iontophoresis 41m/ml Dexamethasone;Moist Heat;Balance training;Therapeutic exercise;Therapeutic activities;Functional mobility training;Stair training;Gait training;DME Instruction;Ultrasound;Neuromuscular re-education;Patient/family education;Manual techniques;Taping;Energy conservation;Dry needling   PT Next Visit Plan work on strengthening, balance;    PT Home Exercise Plan advanced- see patient instructions;    Consulted and Agree with Plan of Care Patient        Problem List Patient Active Problem List   Diagnosis Date Noted  . DDD (degenerative disc disease), lumbar 08/29/2014  . Lumbar facet arthropathy 08/29/2014  . Status post lumbar laminectomy 08/29/2014  . Sacroiliac joint disease 08/29/2014  . [place]al affective disorder ([place] 07/14/2014  . Mood altered ([place] 07/14/2014    Tarig Zimmers PT, DPT 06/28/2015, 11:24 AM  [place] [facility] SERVICES [address] [address]Phone: [phone]  Fax:  [phone] Name: Krystall MSAKEENA TEALLMRN: [id number] of Birth: 205/12/1955

## 2015-06-30 ENCOUNTER — Ambulatory Visit: Payer: PPO | Admitting: Physical Therapy

## 2015-06-30 ENCOUNTER — Encounter: Payer: Self-pay | Admitting: Physical Therapy

## 2015-06-30 DIAGNOSIS — Z9181 History of falling: Secondary | ICD-10-CM

## 2015-06-30 DIAGNOSIS — R262 Difficulty in walking, not elsewhere classified: Secondary | ICD-10-CM | POA: Diagnosis not present

## 2015-06-30 DIAGNOSIS — M25551 Pain in right hip: Secondary | ICD-10-CM

## 2015-06-30 NOTE — Therapy (Signed)
[place] MAIN [facility] SERVICES [address] [place], [place], [address] Phone: [phone]   Fax:  [phone]  Physical Therapy Treatment  Patient Details  Name: Barbara Spencer MRN: [id number] Date of Birth: 1955-11-18 Referring Provider: Myrtie Hawk   Encounter Date: 06/30/2015      PT End of Session - 06/30/15 1058    Visit Number 10   Number of Visits 17   Date for PT Re-Evaluation 07/21/15   PT Start Time 1020   PT Stop Time 1105   PT Time Calculation (min) 45 min   Equipment Utilized During Treatment Gait belt   Activity Tolerance Patient tolerated treatment well;No increased pain   Behavior During Therapy [facility] for tasks assessed/performed      Past Medical History  Diagnosis Date  . Bipolar disorder ([place])   . Thyroid disease   . Diabetes mellitus, type II ([place])   . HTN (hypertension)   . Sleep apnea   . Migraines   . Aortic stenosis   . Tachycardia   . Obesity   . Heart murmur   . Seasonal affective disorder ([place])     under control  . Anxiety     managing well with medication  . DDD (degenerative disc disease), lumbar     back and hip    Past Surgical History  Procedure Laterality Date  . Cholecystectomy    . Cesarean section    . Appendectomy    . Cardiac catheterization      x3 -all clear  . Hernia repair      abdominal hernia repair after gallbladder surgery  . Colonoscopy with propofol N/A 02/03/2015    Procedure: COLONOSCOPY WITH PROPOFOL;  Surgeon: Laurence Spates, MD;  [facility];  Service: Endoscopy;  Laterality: N/A;    There were no vitals filed for this visit.  Visit Diagnosis:  Difficulty walking  At high risk for falls  Right hip pain      Subjective Assessment - 06/30/15 1043    Subjective Patient reports feeling increased soreness and fatigue after last session; She reports occasional hip pain when lying down and is still having trouble with getting in/out of bed;     Pertinent History personal factors affecting rehab: anxiety/depression with Bipolar disorder, obesity, high fall risk;    Limitations Sitting;Standing;Walking   How long can you sit comfortably? 10-15 min due to hip pain   How long can you stand comfortably? 5 min due to hip pain;    How long can you walk comfortably? about 100 feet with quad cane limited with hip pain;    Patient Stated Goals be able to squat and stand up without problem; reduce right hip pain; be able to walk and sit longer periods of time;    Currently in Pain? No/denies      TREATMENT: Seated ankle DF/PF x10 reps prior to standing up,  Pt ambulated without AD, x120 feet x2 reps, required close supervision, exhibiting less trunk lean and better foot clearance with narrow base of support; She required mod VCs to increase erect posture, increase step length and work on increasing heel strike for better balance control;   Standing in parallel bars: Heel/toe raises x10  Alternate toe taps on 4 inch step with 2-1 rail assist x10 bilaterally with cues for increase erect posture, increase foot clearance and to improve step back;  Side step up and over on 4 inch step with 2 HHA x2 each direction with cues  to increase step length for better step negotiation; Patient was very slow with side step ups having difficulty with complex tasks and weight shift;  Tandem stance on upside down 1/2  Bolster with 2-0 rail assist 10 sec hold x2 each foot in front with min A for balance and mod VCs to improve weight shift and to increase erect posture; Forward stance on 1/2 bolster with feet apart, erect posture and cues to improve weight shift for better stance control;  Patient is very slow with turning and slow with learning new tasks. She is hesitant to reduce rail assist for fear of falling;  Finished with Nustep level 3 BUE/BLE x4 min (unbilled):                           PT Education - 06/30/15 1058     Education provided Yes   Education Details gait safety, balance exercise   Person(s) Educated Patient   Methods Explanation;Verbal cues   Comprehension Verbalized understanding;Returned demonstration;Verbal cues required             PT Long Term Goals - 06/23/15 0855    PT LONG TERM GOAL #1   Title Patient will be independent in home exercise program to improve strength/mobility for better functional independence with ADLs. by 07/21/15   Time 8   Period Weeks   Status On-going   PT LONG TERM GOAL #2   Title Patient (> 48 years old) will complete five times sit to stand test in < 15 seconds indicating an increased LE strength and improved balance. by 07/21/15   Time 8   Period Weeks   Status Partially Met   PT LONG TERM GOAL #3   Title Patient will increase 10 meter walk test to >1.94ms as to improve gait speed for better community ambulation and to reduce fall risk. by 07/21/15   Time 8   Period Weeks   Status Achieved   PT LONG TERM GOAL #4   Title Patient will report a worst pain of 3/10 on VAS in     right hip        to improve tolerance with ADLs and reduced symptoms with activities. by 07/21/15   Time 8   Period Weeks   Status Achieved   PT LONG TERM GOAL #5   Title Patient will increase lower extremity functional scale to >40/80 to demonstrate improved functional mobility and increased tolerance with ADLs. by 07/21/15   Time 8   Period Weeks   Status Partially Met               Plan - 06/30/15 1058    Clinical Impression Statement Instructed patient in advanced balance exercise with standing on uneven surfaces and narrow base of support; She was slow to learn new tasks having difficulty with LE movement; Patient also required increased cues for better weight shift and to improve balance control; She required mod Vcs to increase step length and increase erect posture with gait for better safety; She would benefit from additional skilled PT intervention to improve LE  strength, balance and gait safety;    Pt will benefit from skilled therapeutic intervention in order to improve on the following deficits Abnormal gait;Decreased endurance;Obesity;Cardiopulmonary status limiting activity;Decreased activity tolerance;Decreased strength;Pain;Difficulty walking;Decreased mobility;Decreased balance;Improper body mechanics;Impaired flexibility;Decreased safety awareness   Rehab Potential Fair   Clinical Impairments Affecting Rehab Potential positive: husband supportive, negative: co-morbidities; patient's presentation is evolving as her hip pain  is up/down and her high fall risk really limits her mobility;    PT Frequency 2x / week   PT Duration 8 weeks   PT Treatment/Interventions ADLs/Self Care Home Management;Cryotherapy;Electrical Stimulation;Iontophoresis 83m/ml Dexamethasone;Moist Heat;Balance training;Therapeutic exercise;Therapeutic activities;Functional mobility training;Stair training;Gait training;DME Instruction;Ultrasound;Neuromuscular re-education;Patient/family education;Manual techniques;Taping;Energy conservation;Dry needling   PT Next Visit Plan work on strengthening, balance;    PT Home Exercise Plan continue as given;    Consulted and Agree with Plan of Care Patient        Problem List Patient Active Problem List   Diagnosis Date Noted  . DDD (degenerative disc disease), lumbar 08/29/2014  . Lumbar facet arthropathy 08/29/2014  . Status post lumbar laminectomy 08/29/2014  . Sacroiliac joint disease 08/29/2014  . Seasonal affective disorder ([place] 07/14/2014  . Mood altered ([place] 07/14/2014    Justyce Yeater PT, DPT 06/30/2015, 11:00 AM  [place]MAIN [facility] LPSERVICES [address] [address]Phone: [phone]  Fax:  [phone] Name: Dulce MJERUSALEM BROWNSTEINMRN: [id number] of Birth: 206-15-1957

## 2015-07-04 ENCOUNTER — Ambulatory Visit: Payer: PPO | Admitting: Psychiatry

## 2015-07-04 DIAGNOSIS — E669 Obesity, unspecified: Secondary | ICD-10-CM | POA: Diagnosis not present

## 2015-07-04 DIAGNOSIS — G894 Chronic pain syndrome: Secondary | ICD-10-CM | POA: Diagnosis not present

## 2015-07-04 DIAGNOSIS — G8929 Other chronic pain: Secondary | ICD-10-CM | POA: Diagnosis not present

## 2015-07-04 DIAGNOSIS — F419 Anxiety disorder, unspecified: Secondary | ICD-10-CM | POA: Diagnosis not present

## 2015-07-04 DIAGNOSIS — M545 Low back pain: Secondary | ICD-10-CM | POA: Diagnosis not present

## 2015-07-04 DIAGNOSIS — F1120 Opioid dependence, uncomplicated: Secondary | ICD-10-CM | POA: Diagnosis not present

## 2015-07-04 DIAGNOSIS — F319 Bipolar disorder, unspecified: Secondary | ICD-10-CM | POA: Diagnosis not present

## 2015-07-04 DIAGNOSIS — M5441 Lumbago with sciatica, right side: Secondary | ICD-10-CM | POA: Diagnosis not present

## 2015-07-05 ENCOUNTER — Encounter: Payer: Self-pay | Admitting: Physical Therapy

## 2015-07-05 ENCOUNTER — Ambulatory Visit: Payer: PPO | Admitting: Physical Therapy

## 2015-07-05 DIAGNOSIS — R262 Difficulty in walking, not elsewhere classified: Secondary | ICD-10-CM | POA: Diagnosis not present

## 2015-07-05 DIAGNOSIS — M25551 Pain in right hip: Secondary | ICD-10-CM

## 2015-07-05 DIAGNOSIS — M6281 Muscle weakness (generalized): Secondary | ICD-10-CM

## 2015-07-05 NOTE — Therapy (Signed)
[place] MAIN [facility] SERVICES [address] [place], [place], [address] Phone: [phone]   Fax:  [phone]  Physical Therapy Treatment  Patient Details  Name: Denim SARYA LINENBERGER MRN: [id number] Date of Birth: 02-Jan-1956 Referring Provider: Myrtie Hawk   Encounter Date: 07/05/2015      PT End of Session - 07/05/15 1025    Visit Number 11   Number of Visits 19   Date for PT Re-Evaluation 08/02/15   Authorization Type gcode [id number]   Authorization Time Period 10   PT Start Time 1015   PT Stop Time 1100   PT Time Calculation (min) 45 min   Equipment Utilized During Treatment Gait belt   Activity Tolerance Patient tolerated treatment well;No increased pain   Behavior During Therapy [facility] for tasks assessed/performed      Past Medical History  Diagnosis Date  . Bipolar disorder ([place])   . Thyroid disease   . Diabetes mellitus, type II ([place])   . HTN (hypertension)   . Sleep apnea   . Migraines   . Aortic stenosis   . Tachycardia   . Obesity   . Heart murmur   . Seasonal affective disorder ([place])     under control  . Anxiety     managing well with medication  . DDD (degenerative disc disease), lumbar     back and hip    Past Surgical History  Procedure Laterality Date  . Cholecystectomy    . Cesarean section    . Appendectomy    . Cardiac catheterization      x3 -all clear  . Hernia repair      abdominal hernia repair after gallbladder surgery  . Colonoscopy with propofol N/A 02/03/2015    Procedure: COLONOSCOPY WITH PROPOFOL;  Surgeon: Laurence Spates, MD;  [facility];  Service: Endoscopy;  Laterality: N/A;    There were no vitals filed for this visit.      Subjective Assessment - 07/05/15 1023    Subjective Caregiver reports that patient seems to be walking better at home lately. She denies any new falls; She reports using a RW when walking at night but otherwise has not been using any AD;    Pertinent  History personal factors affecting rehab: anxiety/depression with Bipolar disorder, obesity, high fall risk;    Limitations Sitting;Standing;Walking   How long can you sit comfortably? 10-15 min due to hip pain   How long can you stand comfortably? 5 min due to hip pain;    How long can you walk comfortably? about 100 feet with quad cane limited with hip pain;    Patient Stated Goals be able to squat and stand up without problem; reduce right hip pain; be able to walk and sit longer periods of time;    Currently in Pain? No/denies        TREATMENT: Warm up with Nustep level 3 BUE/BLE x4 min (unbilled):  Seated ankle DF/PF x10 reps prior to standing up,  Pt ambulated without AD, x120 feet x2 reps, required close supervision, exhibiting less trunk lean and better foot clearance with narrow base of support; She required min VCs to increase erect posture, increase step length and work on increasing heel strike for better balance control;   Standing in parallel bars: Heel/toe raises x15; Side stepping with red tband around both legs, x12 feet x2 laps each direction with mod VCs to avoid hip ER for better abductor strengthening;  Standing on airex: Balloon taps  each UE x2 min with cues for weight shift and to improve erect posture;   Sit<>Stand with weighted ball (yellow) overhead press x10 reps; Patient required mod VCs to increase forward lean and then to improve erect posture once standing;   Standing on airex: BUE ball pass side/side x5 each direction with cues for weight shift; required supervision for balance;  Weaving around cones #6 x2 sets with cues for increased step length, increased erect posture and to improve foot clearance; patient picked up 2 cones with close supervision and cues to step closer to cones for better balance prior to picking them up;   Patient required min VCs for balance stability, including to increase trunk control for less loss of balance with smaller base  of support                            PT Education - 07/05/15 1025    Education provided Yes   Education Details gait safety, balance;    Person(s) Educated Patient   Methods Explanation;Verbal cues   Comprehension Verbalized understanding;Returned demonstration;Verbal cues required             PT Long Term Goals - 07/05/15 1059    PT LONG TERM GOAL #1   Title Patient will be independent in home exercise program to improve strength/mobility for better functional independence with ADLs. by 08/02/15   Time 8   Period Weeks   Status On-going   PT LONG TERM GOAL #2   Title Patient (> 6 years old) will complete five times sit to stand test in < 15 seconds indicating an increased LE strength and improved balance. by 08/02/15   Time 8   Period Weeks   Status Partially Met   PT LONG TERM GOAL #3   Title Patient will increase 10 meter walk test to >1.43ms as to improve gait speed for better community ambulation and to reduce fall risk. by 08/02/15   Time 8   Period Weeks   Status Achieved   PT LONG TERM GOAL #4   Title Patient will report a worst pain of 3/10 on VAS in     right hip        to improve tolerance with ADLs and reduced symptoms with activities. by 08/02/15   Time 8   Period Weeks   Status Achieved   PT LONG TERM GOAL #5   Title Patient will increase lower extremity functional scale to >40/80 to demonstrate improved functional mobility and increased tolerance with ADLs. by 08/02/15   Time 8   Period Weeks   Status Partially Met               Plan - 07/05/15 1040    Clinical Impression Statement Instructed patient in advanced balance exercise with standing on uneven surface; She continues to require cues to improve erect posture and to improve weight shift for better balance; Patient was able to demonstrate better walking and step length today as compared to previous sessions. She had less episodes of loss of balance and demonstrates  improved foot clearance. She does however, continue to ambulate with a slower gait speed and a narrow base of support. She would benfeit from additional skilled PT Intervention to improve balance/gait safety and reduce fall risk;    Rehab Potential Fair   Clinical Impairments Affecting Rehab Potential positive: husband supportive, negative: co-morbidities; patient's presentation is evolving as her hip pain is up/down and her  high fall risk really limits her mobility;    PT Frequency 2x / week   PT Duration 4 weeks   PT Treatment/Interventions ADLs/Self Care Home Management;Cryotherapy;Electrical Stimulation;Iontophoresis 29m/ml Dexamethasone;Moist Heat;Balance training;Therapeutic exercise;Therapeutic activities;Functional mobility training;Stair training;Gait training;DME Instruction;Ultrasound;Neuromuscular re-education;Patient/family education;Manual techniques;Taping;Energy conservation;Dry needling   PT Next Visit Plan work on strengthening, balance;    PT Home Exercise Plan continue as given;    Consulted and Agree with Plan of Care Patient      Patient will benefit from skilled therapeutic intervention in order to improve the following deficits and impairments:  Abnormal gait, Decreased endurance, Obesity, Cardiopulmonary status limiting activity, Decreased activity tolerance, Decreased strength, Pain, Difficulty walking, Decreased mobility, Decreased balance, Improper body mechanics, Impaired flexibility, Decreased safety awareness  Visit Diagnosis: Pain in right hip - Plan: PT plan of care cert/re-cert  Difficulty in walking, not elsewhere classified - Plan: PT plan of care cert/re-cert  Muscle weakness (generalized) - Plan: PT plan of care cert/re-cert     Problem List Patient Active Problem List   Diagnosis Date Noted  . DDD (degenerative disc disease), lumbar 08/29/2014  . Lumbar facet arthropathy 08/29/2014  . Status post lumbar laminectomy 08/29/2014  . Sacroiliac joint  disease 08/29/2014  . Seasonal affective disorder ([place] 07/14/2014  . Mood altered ([place] 07/14/2014    Trotter,Margaret PT, DPT 07/05/2015, 11:03 AM  CT[place]MAIN [facility] [place]SERVICES [address] [address]Phone: [phone]  Fax:  [phone] Name: Chelan MASAKO SALIBAMRN: [id number] of Birth: [birth year]

## 2015-07-06 ENCOUNTER — Encounter: Payer: Self-pay | Admitting: Psychiatry

## 2015-07-06 ENCOUNTER — Ambulatory Visit (INDEPENDENT_AMBULATORY_CARE_PROVIDER_SITE_OTHER): Payer: PPO | Admitting: Psychiatry

## 2015-07-06 VITALS — BP 124/84 | HR 83 | Temp 98.4°F | Ht 62.0 in | Wt 294.4 lb

## 2015-07-06 DIAGNOSIS — F39 Unspecified mood [affective] disorder: Secondary | ICD-10-CM

## 2015-07-06 DIAGNOSIS — F3340 Major depressive disorder, recurrent, in remission, unspecified: Secondary | ICD-10-CM

## 2015-07-06 MED ORDER — LAMOTRIGINE 100 MG PO TABS: 250.0000 mg | ORAL_TABLET | Freq: Every day | ORAL | Status: DC

## 2015-07-06 MED ORDER — BUPROPION HCL ER (XL) 450 MG PO TB24: 450.0000 mg | ORAL_TABLET | Freq: Every day | ORAL | Status: DC

## 2015-07-06 MED ORDER — VENLAFAXINE HCL ER 225 MG PO TB24: 225.0000 mg | ORAL_TABLET | Freq: Every day | ORAL | Status: DC
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
## 2015-07-07 ENCOUNTER — Encounter: Payer: Self-pay | Admitting: Physical Therapy

## 2015-07-07 ENCOUNTER — Ambulatory Visit: Payer: PPO | Admitting: Psychiatry

## 2015-07-08 ENCOUNTER — Ambulatory Visit: Payer: PPO | Admitting: Physical Therapy

## 2015-07-08 ENCOUNTER — Encounter: Payer: Self-pay | Admitting: Physical Therapy

## 2015-07-08 DIAGNOSIS — M6281 Muscle weakness (generalized): Secondary | ICD-10-CM

## 2015-07-08 DIAGNOSIS — R262 Difficulty in walking, not elsewhere classified: Secondary | ICD-10-CM

## 2015-07-08 DIAGNOSIS — M25551 Pain in right hip: Secondary | ICD-10-CM

## 2015-07-08 NOTE — Therapy (Signed)
[place] MAIN [facility] SERVICES [address] [place], [place], [address] Phone: [phone]   Fax:  [phone]  Physical Therapy Treatment/Discharge Summary  Patient Details  Name: Barbara Spencer MRN: [id number] Date of Birth: 25-Nov-1955 Referring Provider: Myrtie Hawk   Encounter Date: 07/08/2015      PT End of Session - 07/08/15 0938    Visit Number 12   Number of Visits 19   Date for PT Re-Evaluation 08/02/15   Authorization Type gcode 5   Authorization Time Period 10   PT Start Time 0930   PT Stop Time 1015   PT Time Calculation (min) 45 min   Equipment Utilized During Treatment Gait belt   Activity Tolerance Patient tolerated treatment well;No increased pain   Behavior During Therapy [facility] for tasks assessed/performed      Past Medical History  Diagnosis Date  . Bipolar disorder ([place])   . Thyroid disease   . Diabetes mellitus, type II ([place])   . HTN (hypertension)   . Sleep apnea   . Migraines   . Aortic stenosis   . Tachycardia   . Obesity   . Heart murmur   . Seasonal affective disorder ([place])     under control  . Anxiety     managing well with medication  . DDD (degenerative disc disease), lumbar     back and hip    Past Surgical History  Procedure Laterality Date  . Cholecystectomy    . Cesarean section    . Appendectomy    . Cardiac catheterization      x3 -all clear  . Hernia repair      abdominal hernia repair after gallbladder surgery  . Colonoscopy with propofol N/A 02/03/2015    Procedure: COLONOSCOPY WITH PROPOFOL;  Surgeon: Laurence Spates, MD;  Location: [facility];  Service: Endoscopy;  Laterality: N/A;    There were no vitals filed for this visit.      Subjective Assessment - 07/08/15 0937    Subjective Patient reports having increased aching in right hip which limited her ability to sleep well; She reports having a gel that she can put on which helps with the soreness; no pain  today;    Pertinent History personal factors affecting rehab: anxiety/depression with Bipolar disorder, obesity, high fall risk;    Limitations Sitting;Standing;Walking   How long can you sit comfortably? 10-15 min due to hip pain   How long can you stand comfortably? 5 min due to hip pain;    How long can you walk comfortably? about 100 feet with quad cane limited with hip pain;    Patient Stated Goals be able to squat and stand up without problem; reduce right hip pain; be able to walk and sit longer periods of time;    Currently in Pain? No/denies           TREATMENT: Warm up with Nustep level 3 BUE/BLE x4 min (unbilled):  Pt ambulated without AD, x120 feet x2 reps, required close supervision, exhibiting less trunk lean and better foot clearance with narrow base of support; She required min VCs to increase erect posture, increase step length and work on increasing heel strike for better balance control;   Standing in parallel bars: Alternate toe taps on 4 inch step with 2-0 rail assist x15 with cues to step all the way back for increased safety; Forward step up on 4 inch step from airex to step with 2 rail assist x10 each LE;  One foot on airex pad, one foot on 4 inch step with BUE ball pass side/side x10 each LE on step for increased challenge with balance;    Sit<> stand with yellow ball overhead lift x10 reps with cues for forward weight shift to increase standing ability;  PT educated patient in gait safety with walking on uneven surface with quad cane: Gait on concrete, open and narrow, uneven surface x200 feet Gait on grass x40 feet with quad cane with cues for sequencing and to slow down gait for increased safety to assess for ground level; Up/down curb with quad cane x1 rep with close supervision to CGA; Instructed patient to work on walking outside more with supervision and with quad cane for increased safety;                        PT Education -  07/08/15 0938    Education provided Yes   Education Details gait safety, balance, strengthening;    Person(s) Educated Patient   Methods Explanation;Verbal cues   Comprehension Verbalized understanding;Returned demonstration;Verbal cues required             PT Long Term Goals - 07/08/15 1130    PT LONG TERM GOAL #1   Title Patient will be independent in home exercise program to improve strength/mobility for better functional independence with ADLs. by 08/02/15   Time 8   Period Weeks   Status Achieved   PT LONG TERM GOAL #2   Title Patient (> 23 years old) will complete five times sit to stand test in < 15 seconds indicating an increased LE strength and improved balance. by 08/02/15   Time 8   Period Weeks   Status Partially Met   PT LONG TERM GOAL #3   Title Patient will increase 10 meter walk test to >1.77ms as to improve gait speed for better community ambulation and to reduce fall risk. by 08/02/15   Time 8   Period Weeks   Status Achieved   PT LONG TERM GOAL #4   Title Patient will report a worst pain of 3/10 on VAS in     right hip        to improve tolerance with ADLs and reduced symptoms with activities. by 08/02/15   Time 8   Period Weeks   Status Achieved   PT LONG TERM GOAL #5   Title Patient will increase lower extremity functional scale to >40/80 to demonstrate improved functional mobility and increased tolerance with ADLs. by 08/02/15   Time 8   Period Weeks   Status Partially Met               Plan - 07/08/15 0947    Clinical Impression Statement Instructed patient in advanced strengthening and balance exercise. She demonstrates improved gait ability demonstrating less trunk lean and better foot clearance; Patient continues to require cues for increased erect posture for improved visual  scan of environment for increased safety with gait tasks. She has managed most of her pain and demonstrates improved gait ability being able to walk outside with quad  cane with close supervision. She is appropriate for DC at this time as she has met most goals and is back to PLOF.    Rehab Potential Fair   Clinical Impairments Affecting Rehab Potential positive: husband supportive, negative: co-morbidities; patient's presentation is evolving as her hip pain is up/down and her high fall risk really limits her mobility;    PT  Frequency 2x / week   PT Duration 4 weeks   PT Treatment/Interventions ADLs/Self Care Home Management;Cryotherapy;Electrical Stimulation;Iontophoresis 33m/ml Dexamethasone;Moist Heat;Balance training;Therapeutic exercise;Therapeutic activities;Functional mobility training;Stair training;Gait training;DME Instruction;Ultrasound;Neuromuscular re-education;Patient/family education;Manual techniques;Taping;Energy conservation;Dry needling   PT Next Visit Plan work on strengthening, balance;    PT Home Exercise Plan continue as given;    Consulted and Agree with Plan of Care Patient      Patient will benefit from skilled therapeutic intervention in order to improve the following deficits and impairments:  Abnormal gait, Decreased endurance, Obesity, Cardiopulmonary status limiting activity, Decreased activity tolerance, Decreased strength, Pain, Difficulty walking, Decreased mobility, Decreased balance, Improper body mechanics, Impaired flexibility, Decreased safety awareness  Visit Diagnosis: Pain in right hip  Difficulty in walking, not elsewhere classified  Muscle weakness (generalized)       G-Codes - 005/03/171130    Functional Assessment Tool Used Clinical judgement, 5 times sit<>stand, 10 meter walk   Functional Limitation Mobility: Walking and moving around   Mobility: Walking and Moving Around Goal Status [phone] At least 20 percent but less than 40 percent impaired, limited or restricted   Mobility: Walking and Moving Around Discharge Status [phone] At least 20 percent but less than 40 percent impaired, limited or restricted       Problem List Patient Active Problem List   Diagnosis Date Noted  . DDD (degenerative disc disease), lumbar 08/29/2014  . Lumbar facet arthropathy 08/29/2014  . Status post lumbar laminectomy 08/29/2014  . Sacroiliac joint disease 08/29/2014  . [place]al affective disorder ([place] 07/14/2014  . Mood altered ([place] 07/14/2014      Trotter,Margaret PT, DPT 4May 03, 2017 11:31 AM  [place] [facility] [facility] [address] [address]Phone: [phone]  Fax:  [phone] Name: Jace MCASSADI PURDIEMRN: [id number] of Birth: [birth year]

## 2015-07-11 ENCOUNTER — Encounter: Payer: Self-pay | Admitting: Occupational Therapy

## 2015-07-11 DIAGNOSIS — G8929 Other chronic pain: Secondary | ICD-10-CM | POA: Diagnosis not present

## 2015-07-12 ENCOUNTER — Encounter: Payer: Self-pay | Admitting: Physical Therapy

## 2015-07-14 ENCOUNTER — Encounter: Payer: Self-pay | Admitting: Physical Therapy

## 2015-07-18 DIAGNOSIS — F419 Anxiety disorder, unspecified: Secondary | ICD-10-CM | POA: Diagnosis not present

## 2015-07-18 DIAGNOSIS — G894 Chronic pain syndrome: Secondary | ICD-10-CM | POA: Diagnosis not present

## 2015-07-18 DIAGNOSIS — F1120 Opioid dependence, uncomplicated: Secondary | ICD-10-CM | POA: Diagnosis not present

## 2015-07-18 DIAGNOSIS — E669 Obesity, unspecified: Secondary | ICD-10-CM | POA: Diagnosis not present

## 2015-07-18 DIAGNOSIS — F0631 Mood disorder due to known physiological condition with depressive features: Secondary | ICD-10-CM | POA: Diagnosis not present

## 2015-07-19 ENCOUNTER — Encounter: Payer: Self-pay | Admitting: Physical Therapy

## 2015-07-21 ENCOUNTER — Encounter: Payer: Self-pay | Admitting: Physical Therapy

## 2015-08-01 DIAGNOSIS — F319 Bipolar disorder, unspecified: Secondary | ICD-10-CM | POA: Diagnosis not present

## 2015-08-01 DIAGNOSIS — M545 Low back pain: Secondary | ICD-10-CM | POA: Diagnosis not present

## 2015-08-01 DIAGNOSIS — F419 Anxiety disorder, unspecified: Secondary | ICD-10-CM | POA: Diagnosis not present

## 2015-08-01 DIAGNOSIS — E669 Obesity, unspecified: Secondary | ICD-10-CM | POA: Diagnosis not present

## 2015-08-01 DIAGNOSIS — M5441 Lumbago with sciatica, right side: Secondary | ICD-10-CM | POA: Diagnosis not present

## 2015-08-01 DIAGNOSIS — F1120 Opioid dependence, uncomplicated: Secondary | ICD-10-CM | POA: Diagnosis not present

## 2015-08-01 DIAGNOSIS — G8929 Other chronic pain: Secondary | ICD-10-CM | POA: Diagnosis not present

## 2015-08-01 DIAGNOSIS — G894 Chronic pain syndrome: Secondary | ICD-10-CM | POA: Diagnosis not present

## 2015-08-03 DIAGNOSIS — Z124 Encounter for screening for malignant neoplasm of cervix: Secondary | ICD-10-CM | POA: Diagnosis not present

## 2015-08-03 DIAGNOSIS — R3915 Urgency of urination: Secondary | ICD-10-CM | POA: Diagnosis not present

## 2015-08-03 DIAGNOSIS — E119 Type 2 diabetes mellitus without complications: Secondary | ICD-10-CM | POA: Diagnosis not present

## 2015-08-03 DIAGNOSIS — Z13818 Encounter for screening for other digestive system disorders: Secondary | ICD-10-CM | POA: Diagnosis not present

## 2015-08-03 DIAGNOSIS — Z0000 Encounter for general adult medical examination without abnormal findings: Secondary | ICD-10-CM | POA: Diagnosis not present

## 2015-08-03 DIAGNOSIS — B372 Candidiasis of skin and nail: Secondary | ICD-10-CM | POA: Diagnosis not present

## 2015-08-03 DIAGNOSIS — I10 Essential (primary) hypertension: Secondary | ICD-10-CM | POA: Diagnosis not present

## 2015-08-03 DIAGNOSIS — E785 Hyperlipidemia, unspecified: Secondary | ICD-10-CM | POA: Diagnosis not present

## 2015-08-05 DIAGNOSIS — Z0000 Encounter for general adult medical examination without abnormal findings: Secondary | ICD-10-CM | POA: Diagnosis not present

## 2015-08-05 DIAGNOSIS — N390 Urinary tract infection, site not specified: Secondary | ICD-10-CM | POA: Diagnosis not present

## 2015-10-05 ENCOUNTER — Ambulatory Visit (INDEPENDENT_AMBULATORY_CARE_PROVIDER_SITE_OTHER): Payer: PPO | Admitting: Psychiatry

## 2015-10-05 DIAGNOSIS — F3160 Bipolar disorder, current episode mixed, unspecified: Secondary | ICD-10-CM

## 2015-10-05 DIAGNOSIS — F39 Unspecified mood [affective] disorder: Secondary | ICD-10-CM | POA: Diagnosis not present

## 2015-10-05 DIAGNOSIS — F3340 Major depressive disorder, recurrent, in remission, unspecified: Secondary | ICD-10-CM

## 2015-10-05 MED ORDER — BUPROPION HCL ER (XL) 300 MG PO TB24: 300.0000 mg | ORAL_TABLET | ORAL | Status: DC
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
## 2015-10-19 DIAGNOSIS — Z6841 Body Mass Index (BMI) 40.0 and over, adult: Secondary | ICD-10-CM

## 2015-10-19 DIAGNOSIS — W19XXXA Unspecified fall, initial encounter: Secondary | ICD-10-CM | POA: Insufficient documentation

## 2015-10-19 DIAGNOSIS — R296 Repeated falls: Secondary | ICD-10-CM | POA: Diagnosis not present

## 2015-10-24 DIAGNOSIS — M5441 Lumbago with sciatica, right side: Secondary | ICD-10-CM | POA: Diagnosis not present

## 2015-10-24 DIAGNOSIS — F419 Anxiety disorder, unspecified: Secondary | ICD-10-CM | POA: Diagnosis not present

## 2015-10-24 DIAGNOSIS — M545 Low back pain: Secondary | ICD-10-CM | POA: Diagnosis not present

## 2015-10-24 DIAGNOSIS — F319 Bipolar disorder, unspecified: Secondary | ICD-10-CM | POA: Diagnosis not present

## 2015-10-24 DIAGNOSIS — G894 Chronic pain syndrome: Secondary | ICD-10-CM | POA: Diagnosis not present

## 2015-10-24 DIAGNOSIS — F1120 Opioid dependence, uncomplicated: Secondary | ICD-10-CM | POA: Diagnosis not present

## 2015-10-24 DIAGNOSIS — G8929 Other chronic pain: Secondary | ICD-10-CM | POA: Diagnosis not present

## 2015-10-24 DIAGNOSIS — Z79899 Other long term (current) drug therapy: Secondary | ICD-10-CM | POA: Diagnosis not present

## 2015-10-24 DIAGNOSIS — E669 Obesity, unspecified: Secondary | ICD-10-CM | POA: Diagnosis not present

## 2015-11-03 DIAGNOSIS — E1165 Type 2 diabetes mellitus with hyperglycemia: Secondary | ICD-10-CM | POA: Diagnosis not present

## 2015-11-03 DIAGNOSIS — E039 Hypothyroidism, unspecified: Secondary | ICD-10-CM | POA: Diagnosis not present

## 2015-11-03 DIAGNOSIS — E785 Hyperlipidemia, unspecified: Secondary | ICD-10-CM | POA: Diagnosis not present

## 2015-11-03 DIAGNOSIS — I10 Essential (primary) hypertension: Secondary | ICD-10-CM | POA: Diagnosis not present

## 2015-11-03 DIAGNOSIS — I951 Orthostatic hypotension: Secondary | ICD-10-CM | POA: Diagnosis not present

## 2015-11-04 DIAGNOSIS — E785 Hyperlipidemia, unspecified: Secondary | ICD-10-CM | POA: Diagnosis not present

## 2015-11-04 DIAGNOSIS — E039 Hypothyroidism, unspecified: Secondary | ICD-10-CM | POA: Diagnosis not present

## 2015-11-04 DIAGNOSIS — E1165 Type 2 diabetes mellitus with hyperglycemia: Secondary | ICD-10-CM | POA: Diagnosis not present

## 2015-11-04 DIAGNOSIS — I10 Essential (primary) hypertension: Secondary | ICD-10-CM | POA: Diagnosis not present

## 2015-11-22 DIAGNOSIS — D649 Anemia, unspecified: Secondary | ICD-10-CM | POA: Diagnosis not present

## 2015-11-22 DIAGNOSIS — D509 Iron deficiency anemia, unspecified: Secondary | ICD-10-CM | POA: Diagnosis not present

## 2015-11-22 DIAGNOSIS — I10 Essential (primary) hypertension: Secondary | ICD-10-CM | POA: Diagnosis not present

## 2015-11-22 DIAGNOSIS — E119 Type 2 diabetes mellitus without complications: Secondary | ICD-10-CM | POA: Diagnosis not present

## 2015-11-29 ENCOUNTER — Other Ambulatory Visit: Payer: Self-pay | Admitting: Gastroenterology

## 2015-11-29 DIAGNOSIS — K920 Hematemesis: Secondary | ICD-10-CM | POA: Diagnosis not present

## 2015-11-29 DIAGNOSIS — D62 Acute posthemorrhagic anemia: Secondary | ICD-10-CM | POA: Diagnosis not present

## 2015-12-01 ENCOUNTER — Encounter (HOSPITAL_COMMUNITY): Payer: Self-pay | Admitting: *Deleted

## 2015-12-01 DIAGNOSIS — G4730 Sleep apnea, unspecified: Secondary | ICD-10-CM | POA: Diagnosis not present

## 2015-12-01 DIAGNOSIS — R0602 Shortness of breath: Secondary | ICD-10-CM | POA: Diagnosis not present

## 2015-12-01 DIAGNOSIS — I10 Essential (primary) hypertension: Secondary | ICD-10-CM | POA: Diagnosis not present

## 2015-12-01 DIAGNOSIS — R55 Syncope and collapse: Secondary | ICD-10-CM | POA: Diagnosis not present

## 2015-12-01 DIAGNOSIS — E782 Mixed hyperlipidemia: Secondary | ICD-10-CM | POA: Diagnosis not present

## 2015-12-01 DIAGNOSIS — I350 Nonrheumatic aortic (valve) stenosis: Secondary | ICD-10-CM | POA: Diagnosis not present

## 2015-12-01 NOTE — Progress Notes (Signed)
Pt made aware to take 70% of humalog 75/25 ( 25 units instead of 36 tonight). Pt made aware of diabetes protocol to hold all diabetes pills and Insulin on DOS ( as previously instructed by MD), check BS every 2 hours in AM prior to arrival and interventions for BS <70. Pt stated that her fasting blood glucose generally ranges between 130-200. Pt made aware to stop  taking vitamins, fish oil, melatonin and herbal medications. Do not take any NSAIDs ie: Ibuprofen, Advil, Naproxen, BC and Goody Powder. Pt verbalized understanding of all pre-op instructions.

## 2015-12-02 ENCOUNTER — Encounter (HOSPITAL_COMMUNITY)
Admission: RE | Disposition: A | Discharge status: Home / Self Care | Payer: Self-pay | Source: Ambulatory Visit | Attending: Gastroenterology

## 2015-12-02 ENCOUNTER — Ambulatory Visit (HOSPITAL_COMMUNITY)
Admission: RE | Admit: 2015-12-02 | Discharge: 2015-12-02 | Disposition: A | Discharge status: Home / Self Care | Payer: PPO | Source: Ambulatory Visit | Attending: Gastroenterology | Admitting: Gastroenterology

## 2015-12-02 ENCOUNTER — Encounter (HOSPITAL_COMMUNITY): Payer: Self-pay | Admitting: *Deleted

## 2015-12-02 ENCOUNTER — Ambulatory Visit (HOSPITAL_COMMUNITY): Payer: PPO | Admitting: Certified Registered Nurse Anesthetist

## 2015-12-02 DIAGNOSIS — K259 Gastric ulcer, unspecified as acute or chronic, without hemorrhage or perforation: Secondary | ICD-10-CM | POA: Diagnosis not present

## 2015-12-02 DIAGNOSIS — E039 Hypothyroidism, unspecified: Secondary | ICD-10-CM | POA: Insufficient documentation

## 2015-12-02 DIAGNOSIS — F419 Anxiety disorder, unspecified: Secondary | ICD-10-CM | POA: Diagnosis not present

## 2015-12-02 DIAGNOSIS — Z6841 Body Mass Index (BMI) 40.0 and over, adult: Secondary | ICD-10-CM | POA: Diagnosis not present

## 2015-12-02 DIAGNOSIS — D509 Iron deficiency anemia, unspecified: Secondary | ICD-10-CM | POA: Diagnosis not present

## 2015-12-02 DIAGNOSIS — I10 Essential (primary) hypertension: Secondary | ICD-10-CM | POA: Diagnosis not present

## 2015-12-02 DIAGNOSIS — K254 Chronic or unspecified gastric ulcer with hemorrhage: Secondary | ICD-10-CM | POA: Insufficient documentation

## 2015-12-02 DIAGNOSIS — F319 Bipolar disorder, unspecified: Secondary | ICD-10-CM | POA: Insufficient documentation

## 2015-12-02 DIAGNOSIS — G4730 Sleep apnea, unspecified: Secondary | ICD-10-CM | POA: Insufficient documentation

## 2015-12-02 DIAGNOSIS — Z794 Long term (current) use of insulin: Secondary | ICD-10-CM | POA: Insufficient documentation

## 2015-12-02 DIAGNOSIS — D649 Anemia, unspecified: Secondary | ICD-10-CM | POA: Diagnosis not present

## 2015-12-02 DIAGNOSIS — E119 Type 2 diabetes mellitus without complications: Secondary | ICD-10-CM | POA: Diagnosis not present

## 2015-12-02 DIAGNOSIS — K2971 Gastritis, unspecified, with bleeding: Secondary | ICD-10-CM | POA: Diagnosis not present

## 2015-12-02 DIAGNOSIS — Z87891 Personal history of nicotine dependence: Secondary | ICD-10-CM | POA: Insufficient documentation

## 2015-12-02 DIAGNOSIS — K920 Hematemesis: Secondary | ICD-10-CM | POA: Diagnosis not present

## 2015-12-02 DIAGNOSIS — Z8673 Personal history of transient ischemic attack (TIA), and cerebral infarction without residual deficits: Secondary | ICD-10-CM | POA: Diagnosis not present

## 2015-12-02 HISTORY — PX: ESOPHAGOGASTRODUODENOSCOPY (EGD) WITH PROPOFOL: SHX5813

## 2015-12-02 HISTORY — DX: Cerebral infarction, unspecified: I63.9

## 2015-12-02 HISTORY — DX: Gastric ulcer, unspecified as acute or chronic, without hemorrhage or perforation: K25.9

## 2015-12-02 LAB — GLUCOSE, CAPILLARY: GLUCOSE-CAPILLARY: 138 mg/dL — AB (ref 65–99)

## 2015-12-02 SURGERY — ESOPHAGOGASTRODUODENOSCOPY (EGD) WITH PROPOFOL
Anesthesia: Monitor Anesthesia Care

## 2015-12-02 SURGERY — EGD (ESOPHAGOGASTRODUODENOSCOPY)
Anesthesia: Monitor Anesthesia Care

## 2015-12-02 MED ORDER — LACTATED RINGERS IV SOLN
INTRAVENOUS | Status: DC
  Administered 2015-12-02: 1000 mL via INTRAVENOUS
  Administered 2015-12-02: 15:00:00 via INTRAVENOUS

## 2015-12-02 MED ORDER — SODIUM CHLORIDE 0.9 % IV SOLN: INTRAVENOUS | Status: DC

## 2015-12-02 MED ORDER — PROPOFOL 500 MG/50ML IV EMUL
INTRAVENOUS | Status: DC | PRN
  Administered 2015-12-02: 50 ug/kg/min via INTRAVENOUS

## 2015-12-02 MED ORDER — LIDOCAINE HCL (CARDIAC) 20 MG/ML IV SOLN
INTRAVENOUS | Status: DC | PRN
  Administered 2015-12-02: 50 mg via INTRATRACHEAL

## 2015-12-02 MED ORDER — PROPOFOL 10 MG/ML IV BOLUS
INTRAVENOUS | Status: DC | PRN
  Administered 2015-12-02: 20 mg via INTRAVENOUS
  Administered 2015-12-02: 10 mg via INTRAVENOUS
  Administered 2015-12-02: 20 mg via INTRAVENOUS
  Administered 2015-12-02: 10 mg via INTRAVENOUS

## 2015-12-02 NOTE — Anesthesia Preprocedure Evaluation (Signed)
Anesthesia Evaluation  Patient identified by MRN, date of birth, ID band Patient awake    Reviewed: Allergy & Precautions, NPO status , Patient's Chart, lab work & pertinent test results  History of Anesthesia Complications Negative for: history of anesthetic complications  Airway Mallampati: II  TM Distance: >3 FB Neck ROM: Full    Dental  (+) Poor Dentition, Missing   Pulmonary sleep apnea , former smoker,    breath sounds clear to auscultation       Cardiovascular hypertension, Pt. on medications (-) angina(-) Past MI  Rhythm:Regular     Neuro/Psych  Headaches,    GI/Hepatic Neg liver ROS,   Endo/Other  diabetes, Type 2, Insulin DependentHypothyroidism Morbid obesity  Renal/GU negative Renal ROS     Musculoskeletal  (+) Arthritis ,   Abdominal   Peds  Hematology  (+) anemia ,   Anesthesia Other Findings   Reproductive/Obstetrics                             Anesthesia Physical Anesthesia Plan  ASA: III  Anesthesia Plan: MAC   Post-op Pain Management:    Induction: Intravenous  Airway Management Planned: Natural Airway, Nasal Cannula and Simple Face Mask  Additional Equipment: None  Intra-op Plan:   Post-operative Plan:   Informed Consent: I have reviewed the patients History and Physical, chart, labs and discussed the procedure including the risks, benefits and alternatives for the proposed anesthesia with the patient or authorized representative who has indicated his/her understanding and acceptance.   Dental advisory given  Plan Discussed with: CRNA and Surgeon  Anesthesia Plan Comments:         Anesthesia Quick Evaluation

## 2015-12-02 NOTE — Discharge Instructions (Signed)

## 2015-12-02 NOTE — H&P (Signed)
Subjective:   Patient is a 60 y.o. female presents with Recent hematemesis. She stated that she has been having black stools and had hematemesis. Recent labs showed a drop in hemoglobin. The time we saw her stool was he negative.. Procedure including risks and benefits discussed in office.  Patient Active Problem List   Diagnosis Date Noted  . DDD (degenerative disc disease), lumbar 08/29/2014  . Lumbar facet arthropathy 08/29/2014  . Status post lumbar laminectomy 08/29/2014  . Sacroiliac joint disease 08/29/2014  . Seasonal affective disorder ([place]) 07/14/2014  . Mood altered ([place]) 07/14/2014   Past Medical History:  Diagnosis Date  . Anemia   . Anxiety    managing well with medication  . Aortic stenosis   . Bipolar disorder ([place])   . DDD (degenerative disc disease), lumbar    back and hip  . Diabetes mellitus, type II ([place])   . Heart murmur   . HTN (hypertension)   . Migraines   . Obesity   . Seasonal affective disorder ([place])    under control  . Sleep apnea   . Stroke ([place])    x 2  . Tachycardia   . Thyroid disease     Past Surgical History:  Procedure Laterality Date  . APPENDECTOMY    . CARDIAC CATHETERIZATION     x3 -all clear  . CESAREAN SECTION    . CHOLECYSTECTOMY    . COLONOSCOPY WITH PROPOFOL N/A 02/03/2015   Procedure: COLONOSCOPY WITH PROPOFOL;  Surgeon: Laurence Spates, MD;  Location: [facility];  Service: Endoscopy;  Laterality: N/A;  . HERNIA REPAIR     abdominal hernia repair after gallbladder surgery    Prescriptions Prior to Admission  Medication Sig Dispense Refill Last Dose  . buPROPion (WELLBUTRIN XL) 300 MG 24 hr tablet Take 1 tablet (300 mg total) by mouth every morning. 30 tablet 2 12/02/2015 at 0800  . Cholecalciferol (VITAMIN D3) 1000 UNITS CHEW Chew 6 tablets by mouth daily.    12/01/2015 at 2100  . HUMALOG MIX 75/25 KWIKPEN (75-25) 100 UNIT/ML Kwikpen INJ 50 UNITS [place] BID  6 12/01/2015 at 1700  . HYDROcodone-acetaminophen (NORCO/VICODIN)  5-325 MG tablet Take 1 tablet by mouth 2 (two) times daily.    Past Week at Unknown time  . insulin lispro protamine-lispro (HUMALOG 75/25 MIX) (75-25) 100 UNIT/ML SUSP injection Inject 60 Units into the skin 2 (two) times daily with a meal.    12/01/2015 at 1700  . ketoconazole (NIZORAL) 2 % cream Apply 1 application topically daily as needed for irritation.    Past Month at Unknown time  . levothyroxine (SYNTHROID, LEVOTHROID) 50 MCG tablet 50 mcg daily before breakfast.    12/02/2015 at 0800  . losartan-hydrochlorothiazide (HYZAAR) 100-12.5 MG per tablet Take 1 tablet by mouth daily.    12/01/2015 at Unknown time  . losartan-hydrochlorothiazide (HYZAAR) 50-12.5 MG tablet TK 1 T PO QAM  3 12/01/2015 at 0800  . metFORMIN (GLUCOPHAGE) 1000 MG tablet TK 1 T PO BID  2 12/01/2015 at 1700  . metoprolol (LOPRESSOR) 50 MG tablet Take 50 mg by mouth 2 (two) times daily.   12/02/2015 at 0800  . rosuvastatin (CRESTOR) 40 MG tablet Take 1 tablet by mouth daily.  0 Past Week at Unknown time  . Venlafaxine HCl 225 MG TB24 Take 1 tablet (225 mg total) by mouth daily. 30 each 4 12/02/2015 at 0800  . BD ULTRA-FINE PEN NEEDLES 29G X 12.7MM MISC 2 (two) times daily. as directed  1  Taking  . diclofenac sodium (VOLTAREN) 1 % GEL Apply topically 2 (two) times daily as needed (pain). Apply to lower back or right hip.   Unknown at Unknown time  . erythromycin ophthalmic ointment APP A 1 CM RIBBON REY UP TO 6 XD FOR 7-10 DAYS  0 Unknown at Unknown time  . Melatonin 10 MG TABS Take 1 tablet by mouth at bedtime as needed (insomnia).    Unknown at Unknown time  . ondansetron (ZOFRAN) 4 MG tablet Take 1 tablet by mouth daily as needed for nausea or vomiting.    Unknown at Unknown time  . polyethylene glycol-electrolytes (NULYTELY/GOLYTELY) 420 G solution as directed.  0 Unknown at Unknown time   Allergies  Allergen Reactions  . Latex Hives    Pt denies latex allergy during phone interview (12-08-10)but is listed as an allergy in Dr  [place] h&p  . Sulfa Antibiotics Swelling    Other reaction(s): Itching of Skin  . Tape Rash    Paper tape ok to use per pt-Tegaderm reddens skin and Electrodes "ripped skin off and now have scars"    Social History  Substance Use Topics  . Smoking status: Former Smoker    Types: Cigarettes    Quit date: 09/02/2004  . Smokeless tobacco: Never Used  . Alcohol use 0.0 oz/week     Comment: occasional    Family History  Problem Relation Age of Onset  . Heart disease Mother   . Diabetes Mother   . Hypertension Mother   . Heart disease Father   . Dementia Father   . Alcohol abuse Father   . Breast cancer Neg Hx      Objective:   Patient Vitals for the past 8 hrs:  BP Temp Temp src Resp SpO2 Height Weight  12/02/15 1300 (!) 166/89 98.5 F (36.9 C) Oral 18 98 % 5\' 2"  (1.575 m) 135.2 kg (298 lb)   No intake/output data recorded. No intake/output data recorded.   See MD Preop evaluation      Assessment:   1. Hematemesis  Plan:   Will proceed at this time with EGD

## 2015-12-02 NOTE — Transfer of Care (Signed)
Immediate Anesthesia Transfer of Care Note  Patient: Barbara Spencer  Procedure(s) Performed: Procedure(s): ESOPHAGOGASTRODUODENOSCOPY (EGD) WITH PROPOFOL (N/A)  Patient Location: Endoscopy Unit  Anesthesia Type:MAC  Level of Consciousness: awake, alert  and oriented  Airway & Oxygen Therapy: Patient Spontanous Breathing and Patient connected to nasal cannula oxygen  Post-op Assessment: Report given to RN, Post -op Vital signs reviewed and stable and Patient moving all extremities  Post vital signs: Reviewed and stable  Last Vitals:  Vitals:   12/02/15 1300 12/02/15 1549  BP: (!) 166/89 (!) 166/76  Pulse:  72  Resp: 18 18  Temp: 36.9 C 36.6 C    Last Pain:  Vitals:   12/02/15 1549  TempSrc: Oral         Complications: No apparent anesthesia complications

## 2015-12-02 NOTE — Op Note (Signed)
[facility] Patient Name: Barbara Spencer Procedure Date : 12/02/2015 MRN: [id number] Attending MD: Nancy Fetter Dr., MD Date of Birth: 1955/09/19 CSN: [id number] Age: 60 Admit Type: Inpatient Procedure:                Upper GI endoscopy with biopsy Indications:              Unexplained iron deficiency anemia, Hematemesis Providers:                Jeneen Rinks L. Terre Hanneman Dr., MD, Cleda Daub, RN, Cherylynn Ridges, Technician, Ivar Drape CRNA, CRNA Referring MD:              Medicines:                Monitored Anesthesia Care Complications:            No immediate complications. Estimated Blood Loss:     Estimated blood loss: none. Procedure:                Pre-Anesthesia Assessment:                           - Prior to the procedure, a History and Physical                            was performed, and patient medications and                            allergies were reviewed. The patient's tolerance of                            previous anesthesia was also reviewed. The risks                            and benefits of the procedure and the sedation                            options and risks were discussed with the patient.                            All questions were answered, and informed consent                            was obtained. Prior Anticoagulants: The patient has                            taken no previous anticoagulant or antiplatelet                            agents. ASA Grade Assessment: III - A patient with                            severe systemic disease. After reviewing the risks  and benefits, the patient was deemed in                            satisfactory condition to undergo the procedure.                           After obtaining informed consent, the endoscope was                            passed under direct vision. Throughout the                            procedure, the patient's blood  pressure, pulse, and                            oxygen saturations were monitored continuously. The                            [id number]) scope was introduced through the                            mouth, and advanced to the second part of duodenum.                            The upper GI endoscopy was accomplished without                            difficulty. The patient tolerated the procedure                            well. Scope In: Scope Out: Findings:      The esophagus was normal.      One non-bleeding cratered gastric ulcer with no stigmata of bleeding was       found on the posterior wall of the stomach. The lesion was 10 mm in       largest dimension. Biopsies were taken with a cold forceps for histology.      The examined duodenum was normal. Impression:               - Normal esophagus.                           - Non-bleeding gastric ulcer with no stigmata of                            bleeding. Biopsied.                           - Normal examined duodenum. Moderate Sedation:      MAC by anesthesia Recommendation:           - Patient has a contact number available for                            emergencies. The signs and symptoms of potential  delayed complications were discussed with the                            patient. Return to normal activities tomorrow.                            Written discharge instructions were provided to the                            patient.                           - Resume previous diet.                           - Continue present medications.                           - No aspirin, ibuprofen, naproxen, or other                            non-steroidal anti-inflammatory drugs.                           - Return to endoscopist in 1 month.                           - Use Prilosec (omeprazole) 20 mg PO daily. Procedure Code(s):        --- Professional ---                           [id number],  Esophagogastroduodenoscopy, flexible,                            transoral; with biopsy, single or multiple Diagnosis Code(s):        --- Professional ---                           K25.9, Gastric ulcer, unspecified as acute or                            chronic, without hemorrhage or perforation                           D50.9, Iron deficiency anemia, unspecified                           K92.0, Hematemesis CPT copyright 2016 [employer]. All rights reserved. The codes documented in this report are preliminary and upon coder review may  be revised to meet current compliance requirements. Nancy Fetter Dr., MD 12/02/2015 3:48:30 PM This report has been signed electronically. Number of Addenda: 0

## 2015-12-05 ENCOUNTER — Ambulatory Visit (INDEPENDENT_AMBULATORY_CARE_PROVIDER_SITE_OTHER): Payer: PPO | Admitting: Psychiatry

## 2015-12-05 ENCOUNTER — Encounter: Payer: Self-pay | Admitting: Psychiatry

## 2015-12-05 VITALS — BP 138/84 | HR 80 | Temp 98.0°F | Ht 62.0 in | Wt 298.2 lb

## 2015-12-05 DIAGNOSIS — F3130 Bipolar disorder, current episode depressed, mild or moderate severity, unspecified: Secondary | ICD-10-CM | POA: Diagnosis not present

## 2015-12-05 DIAGNOSIS — F3340 Major depressive disorder, recurrent, in remission, unspecified: Secondary | ICD-10-CM

## 2015-12-05 DIAGNOSIS — F39 Unspecified mood [affective] disorder: Secondary | ICD-10-CM

## 2015-12-05 MED ORDER — BUPROPION HCL ER (XL) 300 MG PO TB24: 300.0000 mg | ORAL_TABLET | ORAL | 2 refills | Status: DC

## 2015-12-05 MED ORDER — VENLAFAXINE HCL ER 225 MG PO TB24: 225.0000 mg | ORAL_TABLET | Freq: Every day | ORAL | 4 refills | Status: AC

## 2015-12-05 NOTE — Anesthesia Postprocedure Evaluation (Signed)
Anesthesia Post Note  Patient: Barbara Spencer  Procedure(s) Performed: Procedure(s) (LRB): ESOPHAGOGASTRODUODENOSCOPY (EGD) WITH PROPOFOL (N/A)  Patient location during evaluation: PACU Anesthesia Type: MAC Level of consciousness: awake and alert Pain management: pain level controlled Vital Signs Assessment: post-procedure vital signs reviewed and stable Respiratory status: spontaneous breathing, nonlabored ventilation, respiratory function stable and patient connected to nasal cannula oxygen Cardiovascular status: stable and blood pressure returned to baseline Anesthetic complications: no     Last Vitals:  Vitals:   12/02/15 1550 12/02/15 1600  BP: (!) 166/76 (!) 154/78  Pulse: 72 72  Resp: (!) 22 (!) 23  Temp:      Last Pain:  Vitals:   12/02/15 1549  TempSrc: Oral   Pain Goal:                 Reginal Lutes
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
## 2015-12-19 DIAGNOSIS — M545 Low back pain: Secondary | ICD-10-CM | POA: Diagnosis not present

## 2015-12-19 DIAGNOSIS — F319 Bipolar disorder, unspecified: Secondary | ICD-10-CM | POA: Diagnosis not present

## 2015-12-19 DIAGNOSIS — G8929 Other chronic pain: Secondary | ICD-10-CM | POA: Diagnosis not present

## 2015-12-19 DIAGNOSIS — Z79899 Other long term (current) drug therapy: Secondary | ICD-10-CM | POA: Diagnosis not present

## 2015-12-19 DIAGNOSIS — F419 Anxiety disorder, unspecified: Secondary | ICD-10-CM | POA: Diagnosis not present

## 2015-12-19 DIAGNOSIS — F1120 Opioid dependence, uncomplicated: Secondary | ICD-10-CM | POA: Diagnosis not present

## 2015-12-19 DIAGNOSIS — G894 Chronic pain syndrome: Secondary | ICD-10-CM | POA: Diagnosis not present

## 2015-12-19 DIAGNOSIS — M5441 Lumbago with sciatica, right side: Secondary | ICD-10-CM | POA: Diagnosis not present

## 2015-12-19 DIAGNOSIS — E669 Obesity, unspecified: Secondary | ICD-10-CM | POA: Diagnosis not present

## 2015-12-29 DIAGNOSIS — D126 Benign neoplasm of colon, unspecified: Secondary | ICD-10-CM | POA: Diagnosis not present

## 2015-12-29 DIAGNOSIS — Z8719 Personal history of other diseases of the digestive system: Secondary | ICD-10-CM | POA: Diagnosis not present

## 2015-12-29 DIAGNOSIS — K921 Melena: Secondary | ICD-10-CM | POA: Diagnosis not present

## 2016-01-05 DIAGNOSIS — I350 Nonrheumatic aortic (valve) stenosis: Secondary | ICD-10-CM | POA: Diagnosis not present

## 2016-01-05 DIAGNOSIS — R55 Syncope and collapse: Secondary | ICD-10-CM | POA: Diagnosis not present

## 2016-01-05 DIAGNOSIS — I10 Essential (primary) hypertension: Secondary | ICD-10-CM | POA: Diagnosis not present

## 2016-01-05 DIAGNOSIS — E782 Mixed hyperlipidemia: Secondary | ICD-10-CM | POA: Diagnosis not present

## 2016-01-05 DIAGNOSIS — G4730 Sleep apnea, unspecified: Secondary | ICD-10-CM | POA: Diagnosis not present

## 2016-02-10 DIAGNOSIS — E039 Hypothyroidism, unspecified: Secondary | ICD-10-CM | POA: Diagnosis not present

## 2016-02-10 DIAGNOSIS — I10 Essential (primary) hypertension: Secondary | ICD-10-CM | POA: Diagnosis not present

## 2016-02-10 DIAGNOSIS — E782 Mixed hyperlipidemia: Secondary | ICD-10-CM | POA: Diagnosis not present

## 2016-02-10 DIAGNOSIS — E1165 Type 2 diabetes mellitus with hyperglycemia: Secondary | ICD-10-CM | POA: Diagnosis not present

## 2016-02-22 ENCOUNTER — Other Ambulatory Visit: Payer: Self-pay | Admitting: Gastroenterology

## 2016-02-27 ENCOUNTER — Encounter (HOSPITAL_COMMUNITY): Payer: Self-pay

## 2016-02-27 DIAGNOSIS — F1120 Opioid dependence, uncomplicated: Secondary | ICD-10-CM | POA: Diagnosis not present

## 2016-02-27 DIAGNOSIS — G894 Chronic pain syndrome: Secondary | ICD-10-CM | POA: Diagnosis not present

## 2016-02-27 DIAGNOSIS — F319 Bipolar disorder, unspecified: Secondary | ICD-10-CM | POA: Diagnosis not present

## 2016-02-27 DIAGNOSIS — E669 Obesity, unspecified: Secondary | ICD-10-CM | POA: Diagnosis not present

## 2016-02-27 DIAGNOSIS — M5441 Lumbago with sciatica, right side: Secondary | ICD-10-CM | POA: Diagnosis not present

## 2016-02-27 DIAGNOSIS — F419 Anxiety disorder, unspecified: Secondary | ICD-10-CM | POA: Diagnosis not present

## 2016-02-27 DIAGNOSIS — G8929 Other chronic pain: Secondary | ICD-10-CM | POA: Diagnosis not present

## 2016-02-27 DIAGNOSIS — M545 Low back pain: Secondary | ICD-10-CM | POA: Diagnosis not present

## 2016-03-01 ENCOUNTER — Ambulatory Visit (HOSPITAL_COMMUNITY): Payer: PPO | Admitting: Anesthesiology

## 2016-03-01 ENCOUNTER — Ambulatory Visit (HOSPITAL_COMMUNITY)
Admission: RE | Admit: 2016-03-01 | Discharge: 2016-03-01 | Disposition: A | Discharge status: Home / Self Care | Payer: PPO | Source: Ambulatory Visit | Attending: Gastroenterology | Admitting: Gastroenterology

## 2016-03-01 ENCOUNTER — Encounter (HOSPITAL_COMMUNITY)
Admission: RE | Disposition: A | Discharge status: Home / Self Care | Payer: Self-pay | Source: Ambulatory Visit | Attending: Gastroenterology

## 2016-03-01 ENCOUNTER — Other Ambulatory Visit: Payer: Self-pay | Admitting: Gastroenterology

## 2016-03-01 ENCOUNTER — Encounter (HOSPITAL_COMMUNITY): Payer: Self-pay

## 2016-03-01 DIAGNOSIS — I350 Nonrheumatic aortic (valve) stenosis: Secondary | ICD-10-CM | POA: Insufficient documentation

## 2016-03-01 DIAGNOSIS — K317 Polyp of stomach and duodenum: Secondary | ICD-10-CM | POA: Insufficient documentation

## 2016-03-01 DIAGNOSIS — Z8711 Personal history of peptic ulcer disease: Secondary | ICD-10-CM | POA: Insufficient documentation

## 2016-03-01 DIAGNOSIS — Z91040 Latex allergy status: Secondary | ICD-10-CM | POA: Insufficient documentation

## 2016-03-01 DIAGNOSIS — K5730 Diverticulosis of large intestine without perforation or abscess without bleeding: Secondary | ICD-10-CM | POA: Insufficient documentation

## 2016-03-01 DIAGNOSIS — Z9049 Acquired absence of other specified parts of digestive tract: Secondary | ICD-10-CM | POA: Insufficient documentation

## 2016-03-01 DIAGNOSIS — K319 Disease of stomach and duodenum, unspecified: Secondary | ICD-10-CM | POA: Diagnosis not present

## 2016-03-01 DIAGNOSIS — K3189 Other diseases of stomach and duodenum: Secondary | ICD-10-CM

## 2016-03-01 DIAGNOSIS — Z86010 Personal history of colonic polyps: Secondary | ICD-10-CM | POA: Diagnosis not present

## 2016-03-01 DIAGNOSIS — M4696 Unspecified inflammatory spondylopathy, lumbar region: Secondary | ICD-10-CM | POA: Insufficient documentation

## 2016-03-01 DIAGNOSIS — Z8673 Personal history of transient ischemic attack (TIA), and cerebral infarction without residual deficits: Secondary | ICD-10-CM | POA: Diagnosis not present

## 2016-03-01 DIAGNOSIS — Z811 Family history of alcohol abuse and dependence: Secondary | ICD-10-CM | POA: Insufficient documentation

## 2016-03-01 DIAGNOSIS — E039 Hypothyroidism, unspecified: Secondary | ICD-10-CM | POA: Insufficient documentation

## 2016-03-01 DIAGNOSIS — K921 Melena: Secondary | ICD-10-CM | POA: Insufficient documentation

## 2016-03-01 DIAGNOSIS — Z87891 Personal history of nicotine dependence: Secondary | ICD-10-CM | POA: Insufficient documentation

## 2016-03-01 DIAGNOSIS — I10 Essential (primary) hypertension: Secondary | ICD-10-CM | POA: Diagnosis not present

## 2016-03-01 DIAGNOSIS — Z6841 Body Mass Index (BMI) 40.0 and over, adult: Secondary | ICD-10-CM | POA: Diagnosis not present

## 2016-03-01 DIAGNOSIS — Z8249 Family history of ischemic heart disease and other diseases of the circulatory system: Secondary | ICD-10-CM | POA: Insufficient documentation

## 2016-03-01 DIAGNOSIS — Z794 Long term (current) use of insulin: Secondary | ICD-10-CM | POA: Diagnosis not present

## 2016-03-01 DIAGNOSIS — Z9889 Other specified postprocedural states: Secondary | ICD-10-CM | POA: Insufficient documentation

## 2016-03-01 DIAGNOSIS — F419 Anxiety disorder, unspecified: Secondary | ICD-10-CM | POA: Diagnosis not present

## 2016-03-01 DIAGNOSIS — G4730 Sleep apnea, unspecified: Secondary | ICD-10-CM | POA: Diagnosis not present

## 2016-03-01 DIAGNOSIS — E119 Type 2 diabetes mellitus without complications: Secondary | ICD-10-CM | POA: Insufficient documentation

## 2016-03-01 DIAGNOSIS — Z833 Family history of diabetes mellitus: Secondary | ICD-10-CM | POA: Insufficient documentation

## 2016-03-01 DIAGNOSIS — D124 Benign neoplasm of descending colon: Secondary | ICD-10-CM | POA: Diagnosis not present

## 2016-03-01 DIAGNOSIS — M5136 Other intervertebral disc degeneration, lumbar region: Secondary | ICD-10-CM | POA: Insufficient documentation

## 2016-03-01 DIAGNOSIS — Z881 Allergy status to other antibiotic agents status: Secondary | ICD-10-CM | POA: Insufficient documentation

## 2016-03-01 DIAGNOSIS — Z79899 Other long term (current) drug therapy: Secondary | ICD-10-CM | POA: Diagnosis not present

## 2016-03-01 DIAGNOSIS — F319 Bipolar disorder, unspecified: Secondary | ICD-10-CM | POA: Insufficient documentation

## 2016-03-01 DIAGNOSIS — K253 Acute gastric ulcer without hemorrhage or perforation: Secondary | ICD-10-CM | POA: Diagnosis not present

## 2016-03-01 DIAGNOSIS — Z818 Family history of other mental and behavioral disorders: Secondary | ICD-10-CM | POA: Insufficient documentation

## 2016-03-01 DIAGNOSIS — Z91048 Other nonmedicinal substance allergy status: Secondary | ICD-10-CM | POA: Insufficient documentation

## 2016-03-01 DIAGNOSIS — D490 Neoplasm of unspecified behavior of digestive system: Secondary | ICD-10-CM | POA: Diagnosis not present

## 2016-03-01 DIAGNOSIS — Z882 Allergy status to sulfonamides status: Secondary | ICD-10-CM | POA: Insufficient documentation

## 2016-03-01 DIAGNOSIS — Z791 Long term (current) use of non-steroidal anti-inflammatories (NSAID): Secondary | ICD-10-CM | POA: Insufficient documentation

## 2016-03-01 HISTORY — PX: COLONOSCOPY WITH PROPOFOL: SHX5780

## 2016-03-01 HISTORY — DX: Hypothyroidism, unspecified: E03.9

## 2016-03-01 HISTORY — PX: ESOPHAGOGASTRODUODENOSCOPY (EGD) WITH PROPOFOL: SHX5813

## 2016-03-01 LAB — CBC WITH DIFFERENTIAL/PLATELET
BASOS ABS: 0.1 10*3/uL (ref 0.0–0.1)
BASOS PCT: 1 %
Eosinophils Absolute: 0.3 10*3/uL (ref 0.0–0.7)
Eosinophils Relative: 4 %
HEMATOCRIT: 40.2 % (ref 36.0–46.0)
HEMOGLOBIN: 12.8 g/dL (ref 12.0–15.0)
LYMPHS PCT: 30 %
Lymphs Abs: 2.6 10*3/uL (ref 0.7–4.0)
MCH: 25.4 pg — ABNORMAL LOW (ref 26.0–34.0)
MCHC: 31.8 g/dL (ref 30.0–36.0)
MCV: 79.9 fL (ref 78.0–100.0)
Monocytes Absolute: 0.6 10*3/uL (ref 0.1–1.0)
Monocytes Relative: 7 %
NEUTROS ABS: 4.9 10*3/uL (ref 1.7–7.7)
NEUTROS PCT: 58 %
Platelets: 292 10*3/uL (ref 150–400)
RBC: 5.03 MIL/uL (ref 3.87–5.11)
RDW: 16.2 % — AB (ref 11.5–15.5)
WBC: 8.5 10*3/uL (ref 4.0–10.5)

## 2016-03-01 LAB — COMPREHENSIVE METABOLIC PANEL
ALBUMIN: 3.9 g/dL (ref 3.5–5.0)
ALT: 16 U/L (ref 14–54)
AST: 18 U/L (ref 15–41)
Alkaline Phosphatase: 51 U/L (ref 38–126)
Anion gap: 8 (ref 5–15)
BILIRUBIN TOTAL: 0.8 mg/dL (ref 0.3–1.2)
BUN: 18 mg/dL (ref 6–20)
CO2: 29 mmol/L (ref 22–32)
CREATININE: 1.14 mg/dL — AB (ref 0.44–1.00)
Calcium: 9.4 mg/dL (ref 8.9–10.3)
Chloride: 99 mmol/L — ABNORMAL LOW (ref 101–111)
GFR calc Af Amer: 59 mL/min — ABNORMAL LOW (ref >60)
GFR calc non Af Amer: 51 mL/min — ABNORMAL LOW (ref >60)
GLUCOSE: 190 mg/dL — AB (ref 65–99)
POTASSIUM: 4.2 mmol/L (ref 3.5–5.1)
Sodium: 136 mmol/L (ref 135–145)
TOTAL PROTEIN: 6.9 g/dL (ref 6.5–8.1)

## 2016-03-01 LAB — GLUCOSE, CAPILLARY: Glucose-Capillary: 183 mg/dL — ABNORMAL HIGH (ref 65–99)

## 2016-03-01 SURGERY — ESOPHAGOGASTRODUODENOSCOPY (EGD) WITH PROPOFOL
Anesthesia: Monitor Anesthesia Care

## 2016-03-01 MED ORDER — LACTATED RINGERS IV SOLN
INTRAVENOUS | Status: DC
  Administered 2016-03-01: 1000 mL via INTRAVENOUS

## 2016-03-01 MED ORDER — PROPOFOL 10 MG/ML IV BOLUS
INTRAVENOUS | Status: DC | PRN
  Administered 2016-03-01 (×12): 20 mg via INTRAVENOUS
  Administered 2016-03-01: 40 mg via INTRAVENOUS
  Administered 2016-03-01 (×4): 20 mg via INTRAVENOUS
  Administered 2016-03-01: 10 mg via INTRAVENOUS
  Administered 2016-03-01 (×6): 20 mg via INTRAVENOUS
  Administered 2016-03-01: 10 mg via INTRAVENOUS
  Administered 2016-03-01 (×4): 20 mg via INTRAVENOUS

## 2016-03-01 MED ORDER — PROPOFOL 10 MG/ML IV BOLUS
INTRAVENOUS | Status: AC
  Filled 2016-03-01: qty 40

## 2016-03-01 MED ORDER — SODIUM CHLORIDE 0.9 % IV SOLN: INTRAVENOUS | Status: DC

## 2016-03-01 SURGICAL SUPPLY — 24 items

## 2016-03-01 NOTE — Op Note (Signed)
[facility] Patient Name: Barbara Spencer Procedure Date: 03/01/2016 MRN: [id number] Attending MD: Nancy Fetter Dr., MD Date of Birth: 31-Jan-1956 CSN: [id number] Age: 60 Admit Type: Outpatient Procedure:                Upper GI endoscopy Indications:              Follow-up of acute gastric ulcer, she had                            hematemesis and 9/17 underwent EGD showing a                            gastric ulcer in the antrum. This was biopsy and                            showed inflammation but was benign and had no H.                            pylori. This procedure is done to follow-up on that                            ulcer. She has continued to have been positive                            stool. Providers:                Joyice Faster. Carrell Rahmani Dr., MD, Vista Lawman, RN, Ralene Bathe, Technician Referring MD:              Medicines:                See the other procedure note for documentation of                            the administered medications Complications:            No immediate complications. Estimated Blood Loss:     Estimated blood loss: none. Procedure:                Pre-Anesthesia Assessment:                           - Prior to the procedure, a History and Physical                            was performed, and patient medications and                            allergies were reviewed. The patient's tolerance of                            previous anesthesia was also reviewed. The risks  and benefits of the procedure and the sedation                            options and risks were discussed with the patient.                            All questions were answered, and informed consent                            was obtained. Prior Anticoagulants: The patient has                            taken no previous anticoagulant or antiplatelet                            agents. ASA Grade Assessment:  III - A patient with                            severe systemic disease. After reviewing the risks                            and benefits, the patient was deemed in                            satisfactory condition to undergo the procedure.                           After obtaining informed consent, the endoscope was                            passed under direct vision. Throughout the                            procedure, the patient's blood pressure, pulse, and                            oxygen saturations were monitored continuously. The                            [id number]) scope was introduced through the                            mouth, and advanced to the second part of duodenum.                            The upper GI endoscopy was accomplished without                            difficulty. The patient tolerated the procedure                            well. Scope In: Scope Out: Findings:      The esophagus was normal.      A  large, fungating and ulcerated, partially circumferential (involving       one-third of the lumen circumference) mass with no bleeding and no       stigmata of recent bleeding was found in the gastric antrum. Biopsies       were taken with a cold forceps for histology. This was in the area of       the previous gastric ulcer. The remainder the stomach was unremarkable.      The examined duodenum was normal. Impression:               - Normal esophagus.                           - Likely malignant gastric tumor in the gastric                            antrum. Biopsied.                           - Normal examined duodenum. Moderate Sedation:      MAC by anesthesia Recommendation:           - See the other procedure note for documentation of                            additional recommendations.                           - We will draw labs today and make arrangements for                            CT scan to further evaluate and evaluate for                             metastasis.                           - Resume previous diet.                           - Continue present medications. Procedure Code(s):        --- Professional ---                           [id number], Esophagogastroduodenoscopy, flexible,                            transoral; with biopsy, single or multiple Diagnosis Code(s):        --- Professional ---                           D49.0, Neoplasm of unspecified behavior of                            digestive system                           K25.3, Acute gastric ulcer without hemorrhage or  perforation CPT copyright 2016 American Medical Association. All rights reserved. The codes documented in this report are preliminary and upon coder review may  be revised to meet current compliance requirements. Nancy Fetter Dr., MD 03/01/2016 9:44:30 AM This report has been signed electronically. Number of Addenda: 0

## 2016-03-01 NOTE — Discharge Instructions (Addendum)
YOU HAD AN ENDOSCOPIC PROCEDURE TODAY: Refer to the procedure report and other information in the discharge instructions given to you for any specific questions about what was found during the examination. If this information does not answer your questions, please call [facility] office at [phone] to clarify.   YOU SHOULD EXPECT: Some feelings of bloating in the abdomen. Passage of more gas than usual. Walking can help get rid of the air that was put into your GI tract during the procedure and reduce the bloating. If you had a lower endoscopy (such as a colonoscopy or flexible sigmoidoscopy) you may notice spotting of blood in your stool or on the toilet paper. Some abdominal soreness may be present for a day or two, also.  DIET: Your first meal following the procedure should be a light meal and then it is ok to progress to your normal diet. A half-sandwich or bowl of soup is an example of a good first meal. Heavy or fried foods are harder to digest and may make you feel nauseous or bloated. Drink plenty of fluids but you should avoid alcoholic beverages for 24 hours. If you had a esophageal dilation, please see attached instructions for diet.   ACTIVITY: Your care partner should take you home directly after the procedure. You should plan to take it easy, moving slowly for the rest of the day. You can resume normal activity the day after the procedure however YOU SHOULD NOT DRIVE, use power tools, machinery or perform tasks that involve climbing or major physical exertion for 24 hours (because of the sedation medicines used during the test).   SYMPTOMS TO REPORT IMMEDIATELY: A gastroenterologist can be reached at any hour. Please call [phone]  for any of the following symptoms:  Following lower endoscopy (colonoscopy, flexible sigmoidoscopy) Excessive amounts of blood in the stool  Significant tenderness, worsening of abdominal pains  Swelling of the abdomen that is new, acute  Fever of 100 or  higher  Following upper endoscopy (EGD, EUS, ERCP, esophageal dilation) Vomiting of blood or coffee ground material  New, significant abdominal pain  New, significant chest pain or pain under the shoulder blades  Painful or persistently difficult swallowing  New shortness of breath  Black, tarry-looking or red, bloody stools  FOLLOW UP:  If any biopsies were taken you will be contacted by phone or by letter within the next 1-3 weeks. Call [phone]  if you have not heard about the biopsies in 3 weeks.  Please also call with any specific questions about appointments or follow up tests.   Dr Oletta Lamas office will call to set up CT scan

## 2016-03-01 NOTE — H&P (Signed)
Subjective:   Patient is a 60 y.o. female presents with blood in stool with history of previous polyps and ulcers. . Procedure including risks and benefits discussed in office.  Patient Active Problem List   Diagnosis Date Noted  . Morbid obesity with BMI of 50.0-59.9, adult ([place]) 10/19/2015  . Falls 10/19/2015  . DDD (degenerative disc disease), lumbar 08/29/2014  . Lumbar facet arthropathy 08/29/2014  . Status post lumbar laminectomy 08/29/2014  . Sacroiliac joint disease 08/29/2014  . Seasonal affective disorder ([place]) 07/14/2014  . Mood altered ([place]) 07/14/2014   Past Medical History:  Diagnosis Date  . Anxiety    managing well with medication  . Aortic stenosis   . Bipolar disorder ([place])   . DDD (degenerative disc disease), lumbar    back and hip  . Diabetes mellitus, type II ([place])   . Gastric ulcer 12/02/2015  . Heart murmur   . HTN (hypertension)   . Hypothyroidism   . Obesity   . Seasonal affective disorder ([place])    under control  . Sleep apnea   . Stroke ([place])    x 2  . Tachycardia   . Thyroid disease     Past Surgical History:  Procedure Laterality Date  . APPENDECTOMY    . CARDIAC CATHETERIZATION     x3 -all clear  . CESAREAN SECTION    . CHOLECYSTECTOMY    . COLONOSCOPY WITH PROPOFOL N/A 02/03/2015   Procedure: COLONOSCOPY WITH PROPOFOL;  Surgeon: Laurence Spates, MD;  Location: [facility];  Service: Endoscopy;  Laterality: N/A;  . ESOPHAGOGASTRODUODENOSCOPY (EGD) WITH PROPOFOL N/A 12/02/2015   Procedure: ESOPHAGOGASTRODUODENOSCOPY (EGD) WITH PROPOFOL;  Surgeon: Laurence Spates, MD;  Location: [place];  Service: Endoscopy;  Laterality: N/A;  . HERNIA REPAIR     abdominal hernia repair after gallbladder surgery    Prescriptions Prior to Admission  Medication Sig Dispense Refill Last Dose  . buPROPion (WELLBUTRIN XL) 300 MG 24 hr tablet Take 1 tablet (300 mg total) by mouth every morning. 30 tablet 2 02/29/2016 at 0800  . Cholecalciferol (VITAMIN D3)  1000 UNITS CHEW Chew 6,000 Units by mouth daily.    02/29/2016 at 0800  . diclofenac sodium (VOLTAREN) 1 % GEL Apply topically 2 (two) times daily as needed (pain). Apply to lower back or right hip.   Past Week at Unknown time  . HUMALOG MIX 75/25 KWIKPEN (75-25) 100 UNIT/ML Kwikpen 36 UNITS [place] BID with meals  6 Past Week at Unknown time  . HYDROcodone-acetaminophen (NORCO/VICODIN) 5-325 MG tablet Take 1 tablet by mouth 2 (two) times daily as needed for moderate pain.    Past Week at Unknown time  . JANUVIA 100 MG tablet Take 100 mg by mouth daily.  1 Past Week at Unknown time  . ketoconazole (NIZORAL) 2 % cream Apply 1 application topically daily as needed for irritation.    Past Month at Unknown time  . levothyroxine (SYNTHROID, LEVOTHROID) 50 MCG tablet 50 mcg daily before breakfast.    02/29/2016 at 0800  . losartan-hydrochlorothiazide (HYZAAR) 50-12.5 MG tablet TAKE ONE TABLET BY MOUTH ONCE A DAY.  3 03/01/2016 at 0200  . Melatonin 10 MG TABS Take 10 mg by mouth at bedtime as needed (insomnia).    Past Week at Unknown time  . metFORMIN (GLUCOPHAGE) 1000 MG tablet TAKE 1000MG  BY MOUTH TWICE A DAY WITH MEALS.  2 Past Week at Unknown time  . metoprolol (LOPRESSOR) 50 MG tablet Take 50 mg by mouth 2 (two) times daily.  03/01/2016 at 0200  . omeprazole (PRILOSEC OTC) 20 MG tablet Take 20 mg by mouth daily.   Past Week at Unknown time  . ondansetron (ZOFRAN) 4 MG tablet Take 4 mg by mouth daily as needed for nausea or vomiting.    Past Month at Unknown time  . rosuvastatin (CRESTOR) 40 MG tablet Take 40 mg by mouth at bedtime.  3 Past Week at Unknown time  . Venlafaxine HCl 225 MG TB24 Take 1 tablet (225 mg total) by mouth daily. 30 each 4 Past Week at Unknown time  . BD ULTRA-FINE PEN NEEDLES 29G X 12.7MM MISC 2 (two) times daily. as directed  1 Taking   Allergies  Allergen Reactions  . Latex Hives  . Sulfa Antibiotics Itching and Swelling  . Tape Rash    Paper tape ok to use per pt-Tegaderm  reddens skin and Electrodes "ripped skin off and now have scars"    Social History  Substance Use Topics  . Smoking status: Former Smoker    Types: Cigarettes    Quit date: 09/02/2004  . Smokeless tobacco: Never Used  . Alcohol use 0.0 oz/week     Comment: occasional    Family History  Problem Relation Age of Onset  . Heart disease Mother   . Diabetes Mother   . Hypertension Mother   . Heart disease Father   . Dementia Father   . Alcohol abuse Father   . Breast cancer Neg Hx      Objective:   Patient Vitals for the past 8 hrs:  BP Temp Temp src Pulse Resp SpO2 Height Weight  03/01/16 0726 (!) 151/70 97.8 F (36.6 C) Oral 63 12 95 % 5\' 2"  (1.575 m) 135.2 kg (298 lb)   No intake/output data recorded. No intake/output data recorded.   See MD Preop evaluation      Assessment:   1.Blood in stool and patient with history of previous ulcers and polyps  Plan:   We will proceed with EGD colonoscopy at this time have discussed in detail with the patient in the office.

## 2016-03-01 NOTE — Transfer of Care (Signed)
Immediate Anesthesia Transfer of Care Note  Patient: Barbara Spencer  Procedure(s) Performed: Procedure(s): ESOPHAGOGASTRODUODENOSCOPY (EGD) WITH PROPOFOL (N/A) COLONOSCOPY WITH PROPOFOL (N/A)  Patient Location: PACU  Anesthesia Type:MAC  Level of Consciousness: sedated  Airway & Oxygen Therapy: Patient Spontanous Breathing and Patient connected to nasal cannula oxygen  Post-op Assessment: Report given to RN and Post -op Vital signs reviewed and stable  Post vital signs: Reviewed and stable  Last Vitals:  Vitals:   03/01/16 0726  BP: (!) 151/70  Pulse: 63  Resp: 12  Temp: 36.6 C    Last Pain:  Vitals:   03/01/16 0726  TempSrc: Oral         Complications: No apparent anesthesia complications

## 2016-03-01 NOTE — Op Note (Signed)
[facility] Patient Name: Barbara Spencer Procedure Date: 03/01/2016 MRN: [id number] Attending MD: Nancy Fetter Dr., MD Date of Birth: 01-11-1956 CSN: [id number] Age: 60 Admit Type: Outpatient Procedure:                Colonoscopy and polypectomy Indications:              High risk colon cancer surveillance: Personal                            history of colonic polyps colonoscopy 1 year ago                            revealed 6 or 7 polyps. There was a 2 to 3 cm large                            villous adenoma in the distal descending colon Was                            not entirely clear this was all removed. Area was                            tattooed and arrangements were made to proceed with                            repeat colonoscopy in one year. Providers:                Joyice Faster. Nyia Tsao Dr., MD, Vista Lawman, RN, Ralene Bathe, Technician Referring MD:             Dr. Chancy Milroy Medicines:                Monitored Anesthesia Care Complications:            No immediate complications. Estimated Blood Loss:     Estimated blood loss: none. Procedure:                Pre-Anesthesia Assessment:                           - Prior to the procedure, a History and Physical                            was performed, and patient medications and                            allergies were reviewed. The patient's tolerance of                            previous anesthesia was also reviewed. The risks                            and benefits of the procedure and the sedation  options and risks were discussed with the patient.                            All questions were answered, and informed consent                            was obtained. Prior Anticoagulants: The patient has                            taken no previous anticoagulant or antiplatelet                            agents. ASA Grade Assessment: III - A patient with                            severe systemic disease. After reviewing the risks                            and benefits, the patient was deemed in                            satisfactory condition to undergo the procedure.                           After obtaining informed consent, the colonoscope                            was passed under direct vision. Throughout the                            procedure, the patient's blood pressure, pulse, and                            oxygen saturations were monitored continuously. The                            [id number]) scope was introduced through                            the anus and advanced to the the cecum, identified                            by appendiceal orifice and ileocecal valve. The                            colonoscopy was performed without difficulty. The                            patient tolerated the procedure well. The quality                            of the bowel preparation was good. The ileocecal  valve, appendiceal orifice, and rectum were                            photographed. Scope In: 8:57:05 AM Scope Out: 9:21:21 AM Scope Withdrawal Time: 0 hours 18 minutes 0 seconds  Total Procedure Duration: 0 hours 24 minutes 16 seconds  Findings:      The perianal and digital rectal examinations were normal.      A 5 mm polyp was found in the proximal descending colon. The polyp was       sessile. The polyp was removed with a hot snare. Resection and retrieval       were complete.      There is no endoscopic evidence of polyps in the descending colon. The       tattoo from the large villous adenoma was clearly seen.      Many medium-mouthed diverticula were found in the sigmoid colon and       descending colon.      The retroflexed view of the distal rectum and anal verge was normal and       showed no anal or rectal abnormalities. Impression:               - One 5 mm polyp in the proximal  descending colon,                            removed with a hot snare. Resected and retrieved.                           - Diverticulosis in the sigmoid colon and in the                            descending colon.                           - The distal rectum and anal verge are normal on                            retroflexion view.                           - Personal history of colonic polyps. Moderate Sedation:      MAC by anesthesia Recommendation:           - Patient has a contact number available for                            emergencies. The signs and symptoms of potential                            delayed complications were discussed with the                            patient. Return to normal activities tomorrow.                            Written discharge instructions were provided to the  patient.                           - Resume previous diet.                           - Continue present medications.                           - No aspirin, ibuprofen, naproxen, or other                            non-steroidal anti-inflammatory drugs for 5 days                            after polyp removal.                           - Repeat colonoscopy in 3 years for surveillance. Procedure Code(s):        --- Professional ---                           [phone], Colonoscopy, flexible; with removal of                            tumor(s), polyp(s), or other lesion(s) by snare                            technique Diagnosis Code(s):        --- Professional ---                           D12.4, Benign neoplasm of descending colon                           Z86.010, Personal history of colonic polyps                           K57.30, Diverticulosis of large intestine without                            perforation or abscess without bleeding CPT copyright 2016 American Medical Association. All rights reserved. The codes documented in this report are preliminary and  upon coder review may  be revised to meet current compliance requirements. Nancy Fetter Dr., MD 03/01/2016 9:51:11 AM This report has been signed electronically. Number of Addenda: 0

## 2016-03-01 NOTE — Anesthesia Postprocedure Evaluation (Signed)
Anesthesia Post Note  Patient: Barbara Spencer  Procedure(s) Performed: Procedure(s) (LRB): ESOPHAGOGASTRODUODENOSCOPY (EGD) WITH PROPOFOL (N/A) COLONOSCOPY WITH PROPOFOL (N/A)  Patient location during evaluation: PACU Anesthesia Type: MAC Level of consciousness: awake and alert Pain management: pain level controlled Vital Signs Assessment: post-procedure vital signs reviewed and stable Respiratory status: spontaneous breathing Cardiovascular status: stable Anesthetic complications: no    Last Vitals:  Vitals:   03/01/16 0726 03/01/16 0925  BP: (!) 151/70 (!) 132/47  Pulse: 63 72  Resp: 12 19  Temp: 36.6 C 36.4 C    Last Pain:  Vitals:   03/01/16 0925  TempSrc: Oral                 Nolon Nations

## 2016-03-01 NOTE — Anesthesia Preprocedure Evaluation (Signed)
Anesthesia Evaluation  Patient identified by MRN, date of birth, ID band Patient awake    Reviewed: Allergy & Precautions, NPO status , Patient's Chart, lab work & pertinent test results, reviewed documented beta blocker date and time   History of Anesthesia Complications Negative for: history of anesthetic complications  Airway Mallampati: II  TM Distance: >3 FB Neck ROM: Full    Dental  (+) Poor Dentition, Missing   Pulmonary sleep apnea , former smoker,    breath sounds clear to auscultation       Cardiovascular hypertension, Pt. on medications and Pt. on home beta blockers (-) angina(-) Past MI + Valvular Problems/Murmurs  Rhythm:Regular     Neuro/Psych  Headaches, PSYCHIATRIC DISORDERS Anxiety Depression Bipolar Disorder CVA    GI/Hepatic Neg liver ROS, PUD,   Endo/Other  diabetes, Type 2, Insulin DependentHypothyroidism Morbid obesity  Renal/GU negative Renal ROS     Musculoskeletal  (+) Arthritis ,   Abdominal   Peds  Hematology  (+) anemia ,   Anesthesia Other Findings   Reproductive/Obstetrics                             Anesthesia Physical  Anesthesia Plan  ASA: III  Anesthesia Plan: MAC   Post-op Pain Management:    Induction: Intravenous  Airway Management Planned: Nasal Cannula, Simple Face Mask and Natural Airway  Additional Equipment: None  Intra-op Plan:   Post-operative Plan:   Informed Consent: I have reviewed the patients History and Physical, chart, labs and discussed the procedure including the risks, benefits and alternatives for the proposed anesthesia with the patient or authorized representative who has indicated his/her understanding and acceptance.   Dental advisory given  Plan Discussed with: CRNA  Anesthesia Plan Comments:         Anesthesia Quick Evaluation  

## 2016-03-02 ENCOUNTER — Encounter (HOSPITAL_COMMUNITY): Payer: Self-pay | Admitting: Gastroenterology

## 2016-03-05 ENCOUNTER — Ambulatory Visit: Payer: PPO | Admitting: Psychiatry

## 2016-03-08 ENCOUNTER — Ambulatory Visit
Admission: RE | Admit: 2016-03-08 | Discharge: 2016-03-08 | Disposition: A | Discharge status: Home / Self Care | Payer: PPO | Source: Ambulatory Visit | Attending: Gastroenterology | Admitting: Gastroenterology

## 2016-03-08 DIAGNOSIS — J9811 Atelectasis: Secondary | ICD-10-CM | POA: Diagnosis not present

## 2016-03-08 DIAGNOSIS — K3189 Other diseases of stomach and duodenum: Secondary | ICD-10-CM

## 2016-03-08 MED ORDER — IOPAMIDOL (ISOVUE-300) INJECTION 61%: 125.0000 mL | Freq: Once | INTRAVENOUS | Status: DC | PRN

## 2016-03-30 ENCOUNTER — Other Ambulatory Visit: Payer: Self-pay | Admitting: Gastroenterology

## 2016-04-06 DIAGNOSIS — E782 Mixed hyperlipidemia: Secondary | ICD-10-CM | POA: Diagnosis not present

## 2016-04-06 DIAGNOSIS — R0602 Shortness of breath: Secondary | ICD-10-CM | POA: Diagnosis not present

## 2016-04-06 DIAGNOSIS — I420 Dilated cardiomyopathy: Secondary | ICD-10-CM | POA: Diagnosis not present

## 2016-04-06 DIAGNOSIS — I10 Essential (primary) hypertension: Secondary | ICD-10-CM | POA: Diagnosis not present

## 2016-04-06 DIAGNOSIS — I350 Nonrheumatic aortic (valve) stenosis: Secondary | ICD-10-CM | POA: Diagnosis not present

## 2016-04-06 DIAGNOSIS — G4730 Sleep apnea, unspecified: Secondary | ICD-10-CM | POA: Diagnosis not present

## 2016-04-06 DIAGNOSIS — I060 Rheumatic aortic stenosis: Secondary | ICD-10-CM | POA: Diagnosis not present

## 2016-04-09 ENCOUNTER — Encounter (HOSPITAL_COMMUNITY): Payer: Self-pay | Admitting: *Deleted

## 2016-04-09 ENCOUNTER — Other Ambulatory Visit: Payer: Self-pay | Admitting: Gastroenterology

## 2016-04-11 ENCOUNTER — Ambulatory Visit (HOSPITAL_COMMUNITY)
Admission: RE | Admit: 2016-04-11 | Discharge: 2016-04-11 | Disposition: A | Discharge status: Home / Self Care | Payer: PPO | Source: Ambulatory Visit | Attending: Gastroenterology | Admitting: Gastroenterology

## 2016-04-11 ENCOUNTER — Ambulatory Visit (HOSPITAL_COMMUNITY): Payer: PPO | Admitting: Anesthesiology

## 2016-04-11 ENCOUNTER — Encounter (HOSPITAL_COMMUNITY): Payer: Self-pay | Admitting: *Deleted

## 2016-04-11 ENCOUNTER — Encounter (HOSPITAL_COMMUNITY)
Admission: RE | Disposition: A | Discharge status: Home / Self Care | Payer: Self-pay | Source: Ambulatory Visit | Attending: Gastroenterology

## 2016-04-11 DIAGNOSIS — E785 Hyperlipidemia, unspecified: Secondary | ICD-10-CM | POA: Diagnosis not present

## 2016-04-11 DIAGNOSIS — Z8371 Family history of colonic polyps: Secondary | ICD-10-CM | POA: Diagnosis not present

## 2016-04-11 DIAGNOSIS — Z9049 Acquired absence of other specified parts of digestive tract: Secondary | ICD-10-CM | POA: Insufficient documentation

## 2016-04-11 DIAGNOSIS — E669 Obesity, unspecified: Secondary | ICD-10-CM | POA: Insufficient documentation

## 2016-04-11 DIAGNOSIS — I10 Essential (primary) hypertension: Secondary | ICD-10-CM | POA: Insufficient documentation

## 2016-04-11 DIAGNOSIS — Z818 Family history of other mental and behavioral disorders: Secondary | ICD-10-CM | POA: Insufficient documentation

## 2016-04-11 DIAGNOSIS — Z794 Long term (current) use of insulin: Secondary | ICD-10-CM | POA: Insufficient documentation

## 2016-04-11 DIAGNOSIS — G4730 Sleep apnea, unspecified: Secondary | ICD-10-CM | POA: Diagnosis not present

## 2016-04-11 DIAGNOSIS — Z8249 Family history of ischemic heart disease and other diseases of the circulatory system: Secondary | ICD-10-CM | POA: Diagnosis not present

## 2016-04-11 DIAGNOSIS — K317 Polyp of stomach and duodenum: Secondary | ICD-10-CM | POA: Insufficient documentation

## 2016-04-11 DIAGNOSIS — K319 Disease of stomach and duodenum, unspecified: Secondary | ICD-10-CM | POA: Insufficient documentation

## 2016-04-11 DIAGNOSIS — Z833 Family history of diabetes mellitus: Secondary | ICD-10-CM | POA: Diagnosis not present

## 2016-04-11 DIAGNOSIS — Z87892 Personal history of anaphylaxis: Secondary | ICD-10-CM | POA: Diagnosis not present

## 2016-04-11 DIAGNOSIS — Z882 Allergy status to sulfonamides status: Secondary | ICD-10-CM | POA: Diagnosis not present

## 2016-04-11 DIAGNOSIS — I350 Nonrheumatic aortic (valve) stenosis: Secondary | ICD-10-CM | POA: Diagnosis not present

## 2016-04-11 DIAGNOSIS — D649 Anemia, unspecified: Secondary | ICD-10-CM | POA: Diagnosis not present

## 2016-04-11 DIAGNOSIS — Z87891 Personal history of nicotine dependence: Secondary | ICD-10-CM | POA: Insufficient documentation

## 2016-04-11 DIAGNOSIS — F319 Bipolar disorder, unspecified: Secondary | ICD-10-CM | POA: Insufficient documentation

## 2016-04-11 DIAGNOSIS — Z79899 Other long term (current) drug therapy: Secondary | ICD-10-CM | POA: Diagnosis not present

## 2016-04-11 DIAGNOSIS — Z6841 Body Mass Index (BMI) 40.0 and over, adult: Secondary | ICD-10-CM | POA: Diagnosis not present

## 2016-04-11 DIAGNOSIS — Z79891 Long term (current) use of opiate analgesic: Secondary | ICD-10-CM | POA: Diagnosis not present

## 2016-04-11 DIAGNOSIS — E119 Type 2 diabetes mellitus without complications: Secondary | ICD-10-CM | POA: Insufficient documentation

## 2016-04-11 DIAGNOSIS — Z91040 Latex allergy status: Secondary | ICD-10-CM | POA: Diagnosis not present

## 2016-04-11 DIAGNOSIS — K3189 Other diseases of stomach and duodenum: Secondary | ICD-10-CM | POA: Diagnosis not present

## 2016-04-11 HISTORY — PX: EUS: SHX5427

## 2016-04-11 LAB — GLUCOSE, CAPILLARY: GLUCOSE-CAPILLARY: 206 mg/dL — AB (ref 65–99)

## 2016-04-11 SURGERY — UPPER ENDOSCOPIC ULTRASOUND (EUS) RADIAL
Anesthesia: Monitor Anesthesia Care

## 2016-04-11 MED ORDER — LACTATED RINGERS IV SOLN
INTRAVENOUS | Status: DC
  Administered 2016-04-11: 1000 mL via INTRAVENOUS

## 2016-04-11 MED ORDER — PROPOFOL 10 MG/ML IV BOLUS
INTRAVENOUS | Status: DC | PRN
  Administered 2016-04-11: 20 mg via INTRAVENOUS
  Administered 2016-04-11: 30 mg via INTRAVENOUS

## 2016-04-11 MED ORDER — LIDOCAINE 2% (20 MG/ML) 5 ML SYRINGE
INTRAMUSCULAR | Status: DC | PRN
  Administered 2016-04-11: 60 mg via INTRAVENOUS

## 2016-04-11 MED ORDER — SODIUM CHLORIDE 0.9 % IV SOLN: INTRAVENOUS | Status: DC

## 2016-04-11 MED ORDER — ONDANSETRON HCL 4 MG/2ML IJ SOLN
INTRAMUSCULAR | Status: AC
  Filled 2016-04-11: qty 2

## 2016-04-11 MED ORDER — PROPOFOL 500 MG/50ML IV EMUL
INTRAVENOUS | Status: DC | PRN
  Administered 2016-04-11: 100 ug/kg/min via INTRAVENOUS

## 2016-04-11 NOTE — Anesthesia Procedure Notes (Signed)
Procedure Name: MAC Date/Time: 04/11/2016 8:51 AM Performed by: Dione Booze Pre-anesthesia Checklist: Patient identified, Emergency Drugs available, Suction available and Patient being monitored Patient Re-evaluated:Patient Re-evaluated prior to inductionOxygen Delivery Method: Nasal cannula Placement Confirmation: positive ETCO2

## 2016-04-11 NOTE — Transfer of Care (Signed)
Immediate Anesthesia Transfer of Care Note  Patient: Barbara Spencer  Procedure(s) Performed: Procedure(s): UPPER ENDOSCOPIC ULTRASOUND (EUS) RADIAL (N/A)  Patient Location: PACU and Endoscopy Unit  Anesthesia Type:MAC  Level of Consciousness: awake, alert , oriented and patient cooperative  Airway & Oxygen Therapy: Patient Spontanous Breathing and Patient connected to face mask oxygen  Post-op Assessment: Report given to RN and Post -op Vital signs reviewed and stable  Post vital signs: Reviewed and stable  Last Vitals:  Vitals:   04/11/16 0744 04/11/16 0917  BP: (!) 157/70 119/66  Pulse: 60 63  Resp: 10 (!) 6  Temp: 36.5 C 36.7 C    Last Pain:  Vitals:   04/11/16 0917  TempSrc: Oral         Complications: No apparent anesthesia complications

## 2016-04-11 NOTE — Discharge Instructions (Signed)
Endoscopy and Upper endoscopic ultrasound  Care After Please read the instructions outlined below and refer to this sheet in the next few weeks. These discharge instructions provide you with general information on caring for yourself after you leave the hospital. Your doctor may also give you specific instructions. While your treatment has been planned according to the most current medical practices available, unavoidable complications occasionally occur. If you have any problems or questions after discharge, please call Dr. Paulita Fujita [facility]) at [phone].  HOME CARE INSTRUCTIONS Activity  You may resume your regular activity but move at a slower pace for the next 24 hours.   Take frequent rest periods for the next 24 hours.   Walking will help expel (get rid of) the air and reduce the bloated feeling in your abdomen.   No driving for 24 hours (because of the anesthesia (medicine) used during the test).   You may shower.   Do not sign any important legal documents or operate any machinery for 24 hours (because of the anesthesia used during the test).  Nutrition  Drink plenty of fluids.   You may resume your normal diet.   Begin with a light meal and progress to your normal diet.   Avoid alcoholic beverages for 24 hours or as instructed by your caregiver.  Medications You may resume your normal medications unless your caregiver tells you otherwise. What you can expect today  You may experience abdominal discomfort such as a feeling of fullness or "gas" pains.   You may experience a sore throat for 2 to 3 days. This is normal. Gargling with salt water may help this.    SEEK IMMEDIATE MEDICAL CARE IF:  You have excessive nausea (feeling sick to your stomach) and/or vomiting.   You have severe abdominal pain and distention (swelling).   You have trouble swallowing.   You have a temperature over 100 F (37.8 C).   You have rectal bleeding or vomiting of  blood.  Document Released: 10/25/2003 Document Revised: 11/22/2010 Document Reviewed: 05/07/2007 [facility] Patient Information 2012 [place].

## 2016-04-11 NOTE — Anesthesia Preprocedure Evaluation (Signed)
Anesthesia Evaluation  Patient identified by MRN, date of birth, ID band Patient awake    Reviewed: Allergy & Precautions, NPO status , Patient's Chart, lab work & pertinent test results, reviewed documented beta blocker date and time   History of Anesthesia Complications Negative for: history of anesthetic complications  Airway Mallampati: II  TM Distance: >3 FB Neck ROM: Full    Dental  (+) Poor Dentition, Missing   Pulmonary sleep apnea , former smoker,    breath sounds clear to auscultation       Cardiovascular hypertension, Pt. on medications and Pt. on home beta blockers (-) angina(-) Past MI + Valvular Problems/Murmurs  Rhythm:Regular     Neuro/Psych  Headaches, PSYCHIATRIC DISORDERS Anxiety Depression Bipolar Disorder CVA    GI/Hepatic Neg liver ROS, PUD,   Endo/Other  diabetes, Type 2, Insulin DependentHypothyroidism Morbid obesity  Renal/GU negative Renal ROS     Musculoskeletal  (+) Arthritis ,   Abdominal   Peds  Hematology  (+) anemia ,   Anesthesia Other Findings   Reproductive/Obstetrics                             Anesthesia Physical  Anesthesia Plan  ASA: III  Anesthesia Plan: MAC   Post-op Pain Management:    Induction: Intravenous  Airway Management Planned: Nasal Cannula, Simple Face Mask and Natural Airway  Additional Equipment: None  Intra-op Plan:   Post-operative Plan:   Informed Consent: I have reviewed the patients History and Physical, chart, labs and discussed the procedure including the risks, benefits and alternatives for the proposed anesthesia with the patient or authorized representative who has indicated his/her understanding and acceptance.   Dental advisory given  Plan Discussed with: CRNA  Anesthesia Plan Comments:         Anesthesia Quick Evaluation

## 2016-04-11 NOTE — Anesthesia Postprocedure Evaluation (Signed)
Anesthesia Post Note  Patient: Barbara Spencer  Procedure(s) Performed: Procedure(s) (LRB): UPPER ENDOSCOPIC ULTRASOUND (EUS) RADIAL (N/A)  Patient location during evaluation: Endoscopy Anesthesia Type: MAC Level of consciousness: awake and alert Pain management: pain level controlled Vital Signs Assessment: post-procedure vital signs reviewed and stable Respiratory status: spontaneous breathing, nonlabored ventilation, respiratory function stable and patient connected to nasal cannula oxygen Cardiovascular status: stable and blood pressure returned to baseline Anesthetic complications: no       Last Vitals:  Vitals:   04/11/16 0744 04/11/16 0917  BP: (!) 157/70 119/66  Pulse: 60 63  Resp: 10 (!) 6  Temp: 36.5 C 36.7 C    Last Pain:  Vitals:   04/11/16 0917  TempSrc: Oral                 Montez Hageman

## 2016-04-11 NOTE — H&P (Signed)
Patient interval history reviewed.  Patient examined again.  There has been no change from documented H/P dated 03/28/16 (scanned into chart from our office) except as documented above.  Assessment:  1.  Gastric polypoid mass, antrum, biopsies showed hyperplastic tissue alone.  Plan:  1.  Endoscopic ultrasound with possible fine needle aspiration +/- mucosal biopsies. 2.  Risks (bleeding, infection, bowel perforation that could require surgery, sedation-related changes in cardiopulmonary systems), benefits (identification and possible treatment of source of symptoms, exclusion of certain causes of symptoms), and alternatives (watchful waiting, radiographic imaging studies, empiric medical treatment) of upper endoscopy with possible biopsy possible fine needle aspiration (EUS +/- Bx +/- FNA) were explained to patient/family in detail and patient wishes to proceed.

## 2016-04-11 NOTE — Op Note (Signed)
[facility] Patient Name: Barbara Spencer Procedure Date: 04/11/2016 MRN: [id number] Attending MD: Arta Silence , MD Date of Birth: Aug 07, 1955 CSN: [id number] Age: 61 Admit Type: Outpatient Procedure:                Upper endoscopic ultrasound;                            esophagogastroduodenoscopy with biopsy Indications:              Gastric mucosal mass/polyp found on endoscopy Providers:                Arta Silence, MD, Burtis Junes, RN, Cherylynn Ridges,                            Technician, Dione Booze, CRNA Referring MD:             Laurence Spates, MD Medicines:                Monitored Anesthesia Care Complications:            No immediate complications. Estimated Blood Loss:     Estimated blood loss: none. Procedure:                Pre-Anesthesia Assessment:                           - Prior to the procedure, a History and Physical                            was performed, and patient medications and                            allergies were reviewed. The patient's tolerance of                            previous anesthesia was also reviewed. The risks                            and benefits of the procedure and the sedation                            options and risks were discussed with the patient.                            All questions were answered, and informed consent                            was obtained. Prior Anticoagulants: The patient has                            taken no previous anticoagulant or antiplatelet                            agents. ASA Grade Assessment: III - A patient with  severe systemic disease. After reviewing the risks                            and benefits, the patient was deemed in                            satisfactory condition to undergo the procedure.                           After obtaining informed consent, the endoscope was                            passed under direct vision.  Throughout the                            procedure, the patient's blood pressure, pulse, and                            oxygen saturations were monitored continuously. The                            [id number]) scope was introduced through                            the mouth, and advanced to the antrum of the                            stomach. The upper EUS was accomplished without                            difficulty. The patient tolerated the procedure                            well. Scope In: Scope Out: Findings:      Endoscopic Finding :      A single large sessile polyp with no bleeding and no stigmata of recent       bleeding was found in the gastric antrum. Biopsies were taken with a       cold forceps for histology.      Endosonographic Finding :      There was no sign of significant endosonographic abnormality in the       lower third of the esophagus.      There was no sign of significant endosonographic abnormality in the left       lobe of the liver.      There was no sign of significant endosonographic abnormality in the       pancreatic body and in the pancreatic tail.      A lobulated intramural (subepithelial) lesion was found in the antrum of       the stomach. The lesion was hypoechoic. The mucosal layer was focal       hypertrophied in this area. Sonographically, the lesion appeared to       originate from the luminal interface/superficial mucosa (Layer 1) and       deep mucosa (Layer 2). The outer endosonographic borders were well  defined. No involvement of submucosa or muscularis propria seen. No       perilesional adenopathy was seen.      There was no sign of significant endosonographic abnormality involving       the celiac trunk or superior mesenteric artery. Impression:               - A single gastric polyp. Biopsied.                           - There was no sign of significant pathology in the                            lower third of  the esophagus.                           - There was no evidence of significant pathology in                            the left lobe of the liver.                           - There was no sign of significant pathology in the                            pancreatic body and in the pancreatic tail.                           - An intramural (subepithelial) lesion was found in                            the antrum of the stomach, characterized by focal                            hypertrophy of the gastric antrum. Lesion size                            about 3 x 4 cm in size. The lesion appeared to                            originate from within the luminal                            interface/superficial mucosa (Layer 1) and deep                            mucosa (Layer 2). Overall findings most consistent                            with hyperplastic polyp.                           - The celiac trunk and superior mesenteric artery  were endosonographically normal.                           - No evidence of overt gastric malignancy                            identified on today's EGD/EUS. Moderate Sedation:      None Recommendation:           - Discharge patient to home (via wheelchair).                           - Resume previous diet today.                           - Continue present medications.                           - Await path results.                           - Return to GI clinic after studies are complete.                           - Return to primary care physician as previously                            scheduled. Procedure Code(s):        --- Professional ---                           [phone], Esophagogastroduodenoscopy, flexible,                            transoral; with endoscopic ultrasound examination,                            including the esophagus, stomach, and either the                            duodenum or a surgically altered stomach  where the                            jejunum is examined distal to the anastomosis                           43239, 21, Esophagogastroduodenoscopy, flexible,                            transoral; with biopsy, single or multiple Diagnosis Code(s):        --- Professional ---                           K31.7, Polyp of stomach and duodenum                           K31.89, Other diseases of stomach and duodenum CPT copyright  2016 American Medical Association. All rights reserved. The codes documented in this report are preliminary and upon coder review may  be revised to meet current compliance requirements. Arta Silence, MD 04/11/2016 9:22:05 AM This report has been signed electronically. Number of Addenda: 0

## 2016-04-12 ENCOUNTER — Encounter (HOSPITAL_COMMUNITY): Payer: Self-pay | Admitting: Gastroenterology

## 2016-05-09 ENCOUNTER — Other Ambulatory Visit: Payer: Self-pay | Admitting: Internal Medicine

## 2016-05-09 DIAGNOSIS — Z1231 Encounter for screening mammogram for malignant neoplasm of breast: Secondary | ICD-10-CM

## 2016-05-14 DIAGNOSIS — E669 Obesity, unspecified: Secondary | ICD-10-CM | POA: Diagnosis not present

## 2016-05-14 DIAGNOSIS — G894 Chronic pain syndrome: Secondary | ICD-10-CM | POA: Diagnosis not present

## 2016-05-14 DIAGNOSIS — M545 Low back pain: Secondary | ICD-10-CM | POA: Diagnosis not present

## 2016-05-14 DIAGNOSIS — M5441 Lumbago with sciatica, right side: Secondary | ICD-10-CM | POA: Diagnosis not present

## 2016-05-14 DIAGNOSIS — F419 Anxiety disorder, unspecified: Secondary | ICD-10-CM | POA: Diagnosis not present

## 2016-05-14 DIAGNOSIS — F319 Bipolar disorder, unspecified: Secondary | ICD-10-CM | POA: Diagnosis not present

## 2016-05-14 DIAGNOSIS — F1120 Opioid dependence, uncomplicated: Secondary | ICD-10-CM | POA: Diagnosis not present

## 2016-05-14 DIAGNOSIS — G8929 Other chronic pain: Secondary | ICD-10-CM | POA: Diagnosis not present

## 2016-05-18 DIAGNOSIS — E039 Hypothyroidism, unspecified: Secondary | ICD-10-CM | POA: Diagnosis not present

## 2016-05-18 DIAGNOSIS — E1140 Type 2 diabetes mellitus with diabetic neuropathy, unspecified: Secondary | ICD-10-CM | POA: Diagnosis not present

## 2016-05-18 DIAGNOSIS — I10 Essential (primary) hypertension: Secondary | ICD-10-CM | POA: Diagnosis not present

## 2016-05-18 DIAGNOSIS — R609 Edema, unspecified: Secondary | ICD-10-CM | POA: Diagnosis not present

## 2016-05-18 DIAGNOSIS — E782 Mixed hyperlipidemia: Secondary | ICD-10-CM | POA: Diagnosis not present

## 2016-05-22 DIAGNOSIS — K319 Disease of stomach and duodenum, unspecified: Secondary | ICD-10-CM | POA: Diagnosis not present

## 2016-05-22 DIAGNOSIS — E119 Type 2 diabetes mellitus without complications: Secondary | ICD-10-CM | POA: Diagnosis not present

## 2016-05-22 DIAGNOSIS — F319 Bipolar disorder, unspecified: Secondary | ICD-10-CM | POA: Diagnosis not present

## 2016-05-22 DIAGNOSIS — I350 Nonrheumatic aortic (valve) stenosis: Secondary | ICD-10-CM | POA: Diagnosis not present

## 2016-05-22 DIAGNOSIS — G4730 Sleep apnea, unspecified: Secondary | ICD-10-CM | POA: Diagnosis not present

## 2016-05-24 DIAGNOSIS — E119 Type 2 diabetes mellitus without complications: Secondary | ICD-10-CM | POA: Diagnosis not present

## 2016-06-13 DIAGNOSIS — K317 Polyp of stomach and duodenum: Secondary | ICD-10-CM | POA: Diagnosis not present

## 2016-06-15 ENCOUNTER — Ambulatory Visit
Admission: RE | Admit: 2016-06-15 | Discharge: 2016-06-15 | Disposition: A | Discharge status: Home / Self Care | Payer: PPO | Source: Ambulatory Visit | Attending: Internal Medicine | Admitting: Internal Medicine

## 2016-06-15 ENCOUNTER — Ambulatory Visit: Payer: PPO

## 2016-06-15 DIAGNOSIS — I10 Essential (primary) hypertension: Secondary | ICD-10-CM | POA: Diagnosis not present

## 2016-06-15 DIAGNOSIS — Z1329 Encounter for screening for other suspected endocrine disorder: Secondary | ICD-10-CM | POA: Diagnosis not present

## 2016-06-15 DIAGNOSIS — E1165 Type 2 diabetes mellitus with hyperglycemia: Secondary | ICD-10-CM | POA: Diagnosis not present

## 2016-06-15 DIAGNOSIS — Z1231 Encounter for screening mammogram for malignant neoplasm of breast: Secondary | ICD-10-CM | POA: Insufficient documentation

## 2016-06-15 DIAGNOSIS — E139 Other specified diabetes mellitus without complications: Secondary | ICD-10-CM | POA: Diagnosis not present

## 2016-07-05 DIAGNOSIS — I10 Essential (primary) hypertension: Secondary | ICD-10-CM | POA: Diagnosis not present

## 2016-07-05 DIAGNOSIS — I420 Dilated cardiomyopathy: Secondary | ICD-10-CM | POA: Diagnosis not present

## 2016-07-05 DIAGNOSIS — E782 Mixed hyperlipidemia: Secondary | ICD-10-CM | POA: Diagnosis not present

## 2016-07-16 DIAGNOSIS — G8929 Other chronic pain: Secondary | ICD-10-CM | POA: Diagnosis not present

## 2016-07-16 DIAGNOSIS — Z79899 Other long term (current) drug therapy: Secondary | ICD-10-CM | POA: Diagnosis not present

## 2016-07-16 DIAGNOSIS — M5441 Lumbago with sciatica, right side: Secondary | ICD-10-CM | POA: Diagnosis not present

## 2016-07-16 DIAGNOSIS — F319 Bipolar disorder, unspecified: Secondary | ICD-10-CM | POA: Diagnosis not present

## 2016-07-16 DIAGNOSIS — F419 Anxiety disorder, unspecified: Secondary | ICD-10-CM | POA: Diagnosis not present

## 2016-07-16 DIAGNOSIS — M545 Low back pain: Secondary | ICD-10-CM | POA: Diagnosis not present

## 2016-07-16 DIAGNOSIS — G894 Chronic pain syndrome: Secondary | ICD-10-CM | POA: Diagnosis not present

## 2016-07-16 DIAGNOSIS — E669 Obesity, unspecified: Secondary | ICD-10-CM | POA: Diagnosis not present

## 2016-07-16 DIAGNOSIS — F1120 Opioid dependence, uncomplicated: Secondary | ICD-10-CM | POA: Diagnosis not present

## 2016-07-30 DIAGNOSIS — K319 Disease of stomach and duodenum, unspecified: Secondary | ICD-10-CM | POA: Diagnosis not present

## 2016-08-03 DIAGNOSIS — E039 Hypothyroidism, unspecified: Secondary | ICD-10-CM | POA: Insufficient documentation

## 2016-08-03 DIAGNOSIS — E785 Hyperlipidemia, unspecified: Secondary | ICD-10-CM | POA: Diagnosis not present

## 2016-08-03 DIAGNOSIS — Z794 Long term (current) use of insulin: Secondary | ICD-10-CM | POA: Diagnosis not present

## 2016-08-03 DIAGNOSIS — I10 Essential (primary) hypertension: Secondary | ICD-10-CM | POA: Insufficient documentation

## 2016-08-03 DIAGNOSIS — I129 Hypertensive chronic kidney disease with stage 1 through stage 4 chronic kidney disease, or unspecified chronic kidney disease: Secondary | ICD-10-CM | POA: Diagnosis not present

## 2016-08-03 DIAGNOSIS — K319 Disease of stomach and duodenum, unspecified: Secondary | ICD-10-CM | POA: Diagnosis not present

## 2016-08-03 DIAGNOSIS — E1122 Type 2 diabetes mellitus with diabetic chronic kidney disease: Secondary | ICD-10-CM | POA: Diagnosis not present

## 2016-08-03 DIAGNOSIS — K3189 Other diseases of stomach and duodenum: Secondary | ICD-10-CM | POA: Diagnosis not present

## 2016-08-03 DIAGNOSIS — Z8673 Personal history of transient ischemic attack (TIA), and cerebral infarction without residual deficits: Secondary | ICD-10-CM | POA: Insufficient documentation

## 2016-08-03 DIAGNOSIS — K769 Liver disease, unspecified: Secondary | ICD-10-CM | POA: Diagnosis not present

## 2016-08-03 DIAGNOSIS — Z79899 Other long term (current) drug therapy: Secondary | ICD-10-CM | POA: Diagnosis not present

## 2016-08-03 DIAGNOSIS — Z6841 Body Mass Index (BMI) 40.0 and over, adult: Secondary | ICD-10-CM | POA: Diagnosis not present

## 2016-08-03 DIAGNOSIS — Z79891 Long term (current) use of opiate analgesic: Secondary | ICD-10-CM | POA: Diagnosis not present

## 2016-08-03 DIAGNOSIS — R011 Cardiac murmur, unspecified: Secondary | ICD-10-CM | POA: Diagnosis not present

## 2016-08-03 DIAGNOSIS — Z87891 Personal history of nicotine dependence: Secondary | ICD-10-CM | POA: Diagnosis not present

## 2016-08-03 DIAGNOSIS — K2960 Other gastritis without bleeding: Secondary | ICD-10-CM | POA: Diagnosis not present

## 2016-08-03 DIAGNOSIS — Z01818 Encounter for other preprocedural examination: Secondary | ICD-10-CM | POA: Diagnosis not present

## 2016-08-03 DIAGNOSIS — Z9989 Dependence on other enabling machines and devices: Secondary | ICD-10-CM | POA: Diagnosis not present

## 2016-08-03 DIAGNOSIS — E1142 Type 2 diabetes mellitus with diabetic polyneuropathy: Secondary | ICD-10-CM | POA: Insufficient documentation

## 2016-08-03 DIAGNOSIS — W19XXXA Unspecified fall, initial encounter: Secondary | ICD-10-CM | POA: Diagnosis not present

## 2016-08-03 DIAGNOSIS — K259 Gastric ulcer, unspecified as acute or chronic, without hemorrhage or perforation: Secondary | ICD-10-CM | POA: Diagnosis not present

## 2016-08-03 DIAGNOSIS — Z5181 Encounter for therapeutic drug level monitoring: Secondary | ICD-10-CM | POA: Diagnosis not present

## 2016-08-03 DIAGNOSIS — N189 Chronic kidney disease, unspecified: Secondary | ICD-10-CM | POA: Diagnosis not present

## 2016-08-03 DIAGNOSIS — M549 Dorsalgia, unspecified: Secondary | ICD-10-CM | POA: Diagnosis not present

## 2016-08-03 DIAGNOSIS — G4733 Obstructive sleep apnea (adult) (pediatric): Secondary | ICD-10-CM | POA: Diagnosis not present

## 2016-08-03 DIAGNOSIS — E119 Type 2 diabetes mellitus without complications: Secondary | ICD-10-CM | POA: Diagnosis not present

## 2016-08-03 DIAGNOSIS — F418 Other specified anxiety disorders: Secondary | ICD-10-CM | POA: Diagnosis not present

## 2016-08-03 DIAGNOSIS — R932 Abnormal findings on diagnostic imaging of liver and biliary tract: Secondary | ICD-10-CM | POA: Diagnosis not present

## 2016-08-03 DIAGNOSIS — G4739 Other sleep apnea: Secondary | ICD-10-CM | POA: Insufficient documentation

## 2016-08-03 DIAGNOSIS — K317 Polyp of stomach and duodenum: Secondary | ICD-10-CM | POA: Diagnosis not present

## 2016-08-03 DIAGNOSIS — M25559 Pain in unspecified hip: Secondary | ICD-10-CM | POA: Diagnosis not present

## 2016-08-03 HISTORY — DX: Hyperlipidemia, unspecified: E78.5

## 2016-08-03 HISTORY — DX: Personal history of transient ischemic attack (TIA), and cerebral infarction without residual deficits: Z86.73

## 2016-09-17 DIAGNOSIS — M545 Low back pain: Secondary | ICD-10-CM | POA: Diagnosis not present

## 2016-09-17 DIAGNOSIS — G8929 Other chronic pain: Secondary | ICD-10-CM | POA: Diagnosis not present

## 2016-09-17 DIAGNOSIS — F1120 Opioid dependence, uncomplicated: Secondary | ICD-10-CM | POA: Diagnosis not present

## 2016-09-17 DIAGNOSIS — M5441 Lumbago with sciatica, right side: Secondary | ICD-10-CM | POA: Diagnosis not present

## 2016-09-17 DIAGNOSIS — E669 Obesity, unspecified: Secondary | ICD-10-CM | POA: Diagnosis not present

## 2016-09-17 DIAGNOSIS — Z79899 Other long term (current) drug therapy: Secondary | ICD-10-CM | POA: Diagnosis not present

## 2016-09-17 DIAGNOSIS — G894 Chronic pain syndrome: Secondary | ICD-10-CM | POA: Diagnosis not present

## 2016-09-17 DIAGNOSIS — F419 Anxiety disorder, unspecified: Secondary | ICD-10-CM | POA: Diagnosis not present

## 2016-09-17 DIAGNOSIS — F319 Bipolar disorder, unspecified: Secondary | ICD-10-CM | POA: Diagnosis not present

## 2016-09-21 DIAGNOSIS — Z1329 Encounter for screening for other suspected endocrine disorder: Secondary | ICD-10-CM | POA: Diagnosis not present

## 2016-09-21 DIAGNOSIS — E1140 Type 2 diabetes mellitus with diabetic neuropathy, unspecified: Secondary | ICD-10-CM | POA: Diagnosis not present

## 2016-09-21 DIAGNOSIS — I10 Essential (primary) hypertension: Secondary | ICD-10-CM | POA: Diagnosis not present

## 2016-09-21 DIAGNOSIS — E782 Mixed hyperlipidemia: Secondary | ICD-10-CM | POA: Diagnosis not present

## 2016-09-21 DIAGNOSIS — E039 Hypothyroidism, unspecified: Secondary | ICD-10-CM | POA: Diagnosis not present

## 2016-09-21 DIAGNOSIS — E1165 Type 2 diabetes mellitus with hyperglycemia: Secondary | ICD-10-CM | POA: Diagnosis not present

## 2016-09-21 DIAGNOSIS — F319 Bipolar disorder, unspecified: Secondary | ICD-10-CM | POA: Diagnosis not present

## 2016-09-21 DIAGNOSIS — Z113 Encounter for screening for infections with a predominantly sexual mode of transmission: Secondary | ICD-10-CM | POA: Diagnosis not present

## 2016-10-09 DIAGNOSIS — G4730 Sleep apnea, unspecified: Secondary | ICD-10-CM | POA: Diagnosis not present

## 2016-10-09 DIAGNOSIS — I10 Essential (primary) hypertension: Secondary | ICD-10-CM | POA: Diagnosis not present

## 2016-10-09 DIAGNOSIS — I350 Nonrheumatic aortic (valve) stenosis: Secondary | ICD-10-CM | POA: Diagnosis not present

## 2016-10-09 DIAGNOSIS — E782 Mixed hyperlipidemia: Secondary | ICD-10-CM | POA: Diagnosis not present

## 2016-10-12 DIAGNOSIS — K317 Polyp of stomach and duodenum: Secondary | ICD-10-CM | POA: Diagnosis not present

## 2016-10-16 DIAGNOSIS — N179 Acute kidney failure, unspecified: Secondary | ICD-10-CM | POA: Diagnosis not present

## 2016-10-16 DIAGNOSIS — E1165 Type 2 diabetes mellitus with hyperglycemia: Secondary | ICD-10-CM | POA: Diagnosis not present

## 2016-10-16 DIAGNOSIS — I10 Essential (primary) hypertension: Secondary | ICD-10-CM | POA: Diagnosis not present

## 2016-10-16 DIAGNOSIS — E1140 Type 2 diabetes mellitus with diabetic neuropathy, unspecified: Secondary | ICD-10-CM | POA: Diagnosis not present

## 2016-10-17 DIAGNOSIS — R079 Chest pain, unspecified: Secondary | ICD-10-CM | POA: Diagnosis not present

## 2016-10-23 DIAGNOSIS — R0602 Shortness of breath: Secondary | ICD-10-CM | POA: Diagnosis not present

## 2016-10-23 DIAGNOSIS — I350 Nonrheumatic aortic (valve) stenosis: Secondary | ICD-10-CM | POA: Diagnosis not present

## 2016-10-23 DIAGNOSIS — G4730 Sleep apnea, unspecified: Secondary | ICD-10-CM | POA: Diagnosis not present

## 2016-10-23 DIAGNOSIS — I420 Dilated cardiomyopathy: Secondary | ICD-10-CM | POA: Diagnosis not present

## 2016-10-23 DIAGNOSIS — E782 Mixed hyperlipidemia: Secondary | ICD-10-CM | POA: Diagnosis not present

## 2016-10-23 DIAGNOSIS — I10 Essential (primary) hypertension: Secondary | ICD-10-CM | POA: Diagnosis not present

## 2016-11-30 ENCOUNTER — Ambulatory Visit: Payer: Self-pay | Admitting: General Surgery

## 2016-11-30 DIAGNOSIS — K317 Polyp of stomach and duodenum: Secondary | ICD-10-CM | POA: Diagnosis not present

## 2016-12-13 ENCOUNTER — Encounter (HOSPITAL_COMMUNITY): Payer: Self-pay

## 2016-12-13 NOTE — Patient Instructions (Addendum)
Barbara Spencer  12/13/2016   Your procedure is scheduled on: 12/18/16  Report to [facility] Main  Entrance Take [place]  elevators to 3rd floor to  [place] at     0800 AM.    Call this number if you have problems the morning of surgery [phone]    Remember: ONLY 1 PERSON MAY GO WITH YOU TO SHORT STAY TO GET  READY MORNING OF [place].  Do not eat food or drink liquids :After Midnight.     Take these medicines the morning of surgery with A SIP OF WATER: Venlafaxine, omeprazole, metoprolol, levothyroxine, wellbutrin DO NOT TAKE ANY   ORAL   DIABETIC MEDICATIONS DAY OF YOUR SURGERY                               You may not have any metal on your body including hair pins and              piercings  Do not wear jewelry, make-up, lotions, powders or perfumes, deodorant             Do not wear nail polish.  Do not shave  48 hours prior to surgery.  .   Do not bring valuables to the hospital. [place].  Contacts, dentures or bridgework may not be worn into surgery.  Leave suitcase in the car. After surgery it may be brought to your room.                Please read over the following fact sheets you were given: _____________________________________________________________________           [facility] - Preparing for Surgery Before surgery, you can play an important role.  Because skin is not sterile, your skin needs to be as free of germs as possible.  You can reduce the number of germs on your skin by washing with CHG (chlorahexidine gluconate) soap before surgery.  CHG is an antiseptic cleaner which kills germs and bonds with the skin to continue killing germs even after washing. Please DO NOT use if you have an allergy to CHG or antibacterial soaps.  If your skin becomes reddened/irritated stop using the CHG and inform your nurse when you arrive at Short Stay. Do not shave (including legs and  underarms) for at least 48 hours prior to the first CHG shower.  You may shave your face/neck. Please follow these instructions carefully:  1.  Shower with CHG Soap the night before surgery and the  morning of Surgery.  2.  If you choose to wash your hair, wash your hair first as usual with your  normal  shampoo.  3.  After you shampoo, rinse your hair and body thoroughly to remove the  shampoo.                           4.  Use CHG as you would any other liquid soap.  You can apply chg directly  to the skin and wash                       Gently with a scrungie or clean washcloth.  5.  Apply the CHG Soap to your body ONLY FROM THE NECK DOWN.   Do not use on face/ open                           Wound or open sores. Avoid contact with eyes, ears mouth and genitals (private parts).                       Wash face,  Genitals (private parts) with your normal soap.             6.  Wash thoroughly, paying special attention to the area where your surgery  will be performed.  7.  Thoroughly rinse your body with warm water from the neck down.  8.  DO NOT shower/wash with your normal soap after using and rinsing off  the CHG Soap.                9.  Pat yourself dry with a clean towel.            10.  Wear clean pajamas.            11.  Place clean sheets on your bed the night of your first shower and do not  sleep with pets. Day of Surgery : Do not apply any lotions/deodorants the morning of surgery.  Please wear clean clothes to the hospital/surgery center.  FAILURE TO FOLLOW THESE INSTRUCTIONS MAY RESULT IN THE CANCELLATION OF YOUR SURGERY PATIENT SIGNATURE_________________________________  NURSE SIGNATURE__________________________________  ________________________________________________________________________ How to Manage Your Diabetes Before and After Surgery  Why is it important to control my blood sugar before and after surgery? . Improving blood sugar levels before and after surgery helps  healing and can limit problems. . A way of improving blood sugar control is eating a healthy diet by: o  Eating less sugar and carbohydrates o  Increasing activity/exercise o  Talking with your doctor about reaching your blood sugar goals . High blood sugars (greater than 180 mg/dL) can raise your risk of infections and slow your recovery, so you will need to focus on controlling your diabetes during the weeks before surgery. . Make sure that the doctor who takes care of your diabetes knows about your planned surgery including the date and location.  How do I manage my blood sugar before surgery? . Check your blood sugar at least 4 times a day, starting 2 days before surgery, to make sure that the level is not too high or low. o Check your blood sugar the morning of your surgery when you wake up and every 2 hours until you get to the Short Stay unit. . If your blood sugar is less than 70 mg/dL, you will need to treat for low blood sugar: o Do not take insulin. o Treat a low blood sugar (less than 70 mg/dL) with  cup of clear juice (cranberry or apple), 4 glucose tablets, OR glucose gel. o Recheck blood sugar in 15 minutes after treatment (to make sure it is greater than 70 mg/dL). If your blood sugar is not greater than 70 mg/dL on recheck, call [phone] for further instructions. . Report your blood sugar to the short stay nurse when you get to Short Stay.  . If you are admitted to the hospital after surgery: o Your blood sugar will be checked by the staff and you will probably be given insulin after surgery (instead of oral diabetes medicines) to  make sure you have good blood sugar levels. o The goal for blood sugar control after surgery is 80-180 mg/dL.   WHAT DO I DO ABOUT MY DIABETES MEDICATION?  Marland Kitchen Do not take oral diabetes medicines (pills) the morning of surgery.  . THE NIGHT BEFORE SURGERY, take   50% OF YOUR  LANTUS  insulin.       . THE MORNING OF SURGERY, take 50% of   LANTUS   insulin.  . The day of surgery, do not take other diabetes injectables, including Byetta (exenatide), Bydureon (exenatide ER), Victoza (liraglutide), or Trulicity (dulaglutide).  . If your CBG is greater than 220 mg/dL, you may take  of your sliding scale  . (correction) dose of insulin.    Patient Signature:  Date:   Nurse Signature:  Date:   Reviewed and Endorsed by [facility] Patient Education Committee, August 2015

## 2016-12-14 ENCOUNTER — Encounter (INDEPENDENT_AMBULATORY_CARE_PROVIDER_SITE_OTHER): Payer: Self-pay

## 2016-12-14 ENCOUNTER — Encounter (HOSPITAL_COMMUNITY): Payer: Self-pay

## 2016-12-14 ENCOUNTER — Encounter (HOSPITAL_COMMUNITY)
Admission: RE | Admit: 2016-12-14 | Discharge: 2016-12-14 | Disposition: A | Discharge status: Home / Self Care | Payer: PPO | Source: Ambulatory Visit | Attending: General Surgery | Admitting: General Surgery

## 2016-12-14 DIAGNOSIS — K317 Polyp of stomach and duodenum: Secondary | ICD-10-CM | POA: Diagnosis not present

## 2016-12-14 DIAGNOSIS — Z01812 Encounter for preprocedural laboratory examination: Secondary | ICD-10-CM | POA: Diagnosis not present

## 2016-12-14 DIAGNOSIS — E119 Type 2 diabetes mellitus without complications: Secondary | ICD-10-CM | POA: Diagnosis not present

## 2016-12-14 DIAGNOSIS — I10 Essential (primary) hypertension: Secondary | ICD-10-CM | POA: Insufficient documentation

## 2016-12-14 DIAGNOSIS — Z6841 Body Mass Index (BMI) 40.0 and over, adult: Secondary | ICD-10-CM | POA: Diagnosis not present

## 2016-12-14 LAB — COMPREHENSIVE METABOLIC PANEL
ALK PHOS: 54 U/L (ref 38–126)
ALT: 19 U/L (ref 14–54)
AST: 20 U/L (ref 15–41)
Albumin: 3.8 g/dL (ref 3.5–5.0)
Anion gap: 9 (ref 5–15)
BILIRUBIN TOTAL: 0.8 mg/dL (ref 0.3–1.2)
BUN: 25 mg/dL — AB (ref 6–20)
CALCIUM: 9.9 mg/dL (ref 8.9–10.3)
CO2: 26 mmol/L (ref 22–32)
CREATININE: 1.27 mg/dL — AB (ref 0.44–1.00)
Chloride: 102 mmol/L (ref 101–111)
GFR calc non Af Amer: 45 mL/min — ABNORMAL LOW (ref >60)
GFR, EST AFRICAN AMERICAN: 52 mL/min — AB (ref >60)
Glucose, Bld: 339 mg/dL — ABNORMAL HIGH (ref 65–99)
Potassium: 4.4 mmol/L (ref 3.5–5.1)
Sodium: 137 mmol/L (ref 135–145)
Total Protein: 7.2 g/dL (ref 6.5–8.1)

## 2016-12-14 LAB — CBC WITH DIFFERENTIAL/PLATELET
Basophils Absolute: 0.1 10*3/uL (ref 0.0–0.1)
Basophils Relative: 1 %
EOS PCT: 4 %
Eosinophils Absolute: 0.3 10*3/uL (ref 0.0–0.7)
HEMATOCRIT: 40.5 % (ref 36.0–46.0)
HEMOGLOBIN: 12.8 g/dL (ref 12.0–15.0)
LYMPHS ABS: 2.3 10*3/uL (ref 0.7–4.0)
LYMPHS PCT: 26 %
MCH: 24.5 pg — AB (ref 26.0–34.0)
MCHC: 31.6 g/dL (ref 30.0–36.0)
MCV: 77.6 fL — AB (ref 78.0–100.0)
Monocytes Absolute: 0.7 10*3/uL (ref 0.1–1.0)
Monocytes Relative: 8 %
NEUTROS ABS: 5.2 10*3/uL (ref 1.7–7.7)
NEUTROS PCT: 61 %
Platelets: 295 10*3/uL (ref 150–400)
RBC: 5.22 MIL/uL — AB (ref 3.87–5.11)
RDW: 15.7 % — ABNORMAL HIGH (ref 11.5–15.5)
WBC: 8.6 10*3/uL (ref 4.0–10.5)

## 2016-12-14 LAB — GLUCOSE, CAPILLARY: Glucose-Capillary: 313 mg/dL — ABNORMAL HIGH (ref 65–99)

## 2016-12-14 LAB — HEMOGLOBIN A1C
HEMOGLOBIN A1C: 10.3 % — AB (ref 4.8–5.6)
MEAN PLASMA GLUCOSE: 248.91 mg/dL

## 2016-12-14 NOTE — Progress Notes (Signed)
cmp  Done 12/14/16 routed to Dr. Doree Barthel via epic

## 2016-12-14 NOTE — Progress Notes (Signed)
Echo 10/23/16 chart  OVN dr Humphrey Rolls 10/16/16  clearance 10/23/16 in ov note   10/17/16 stress test chart  EKG 07/05/16 chart

## 2016-12-17 NOTE — Progress Notes (Signed)
Hgb a1c done 12/14/16 routed to Dr. Excell Seltzer via epic

## 2016-12-18 ENCOUNTER — Inpatient Hospital Stay (HOSPITAL_COMMUNITY)
Admission: RE | Admit: 2016-12-18 | Discharge: 2016-12-21 | DRG: 327 | Disposition: A | Discharge status: Home / Self Care | Payer: PPO | Source: Ambulatory Visit | Attending: General Surgery | Admitting: General Surgery

## 2016-12-18 ENCOUNTER — Encounter (HOSPITAL_COMMUNITY)
Admission: RE | Disposition: A | Discharge status: Home / Self Care | Payer: Self-pay | Source: Ambulatory Visit | Attending: General Surgery

## 2016-12-18 ENCOUNTER — Inpatient Hospital Stay (HOSPITAL_COMMUNITY): Payer: PPO | Admitting: Anesthesiology

## 2016-12-18 ENCOUNTER — Encounter (HOSPITAL_COMMUNITY): Payer: Self-pay | Admitting: Emergency Medicine

## 2016-12-18 DIAGNOSIS — Z79899 Other long term (current) drug therapy: Secondary | ICD-10-CM

## 2016-12-18 DIAGNOSIS — Z9049 Acquired absence of other specified parts of digestive tract: Secondary | ICD-10-CM

## 2016-12-18 DIAGNOSIS — Z833 Family history of diabetes mellitus: Secondary | ICD-10-CM

## 2016-12-18 DIAGNOSIS — Z9889 Other specified postprocedural states: Secondary | ICD-10-CM | POA: Diagnosis not present

## 2016-12-18 DIAGNOSIS — Z8249 Family history of ischemic heart disease and other diseases of the circulatory system: Secondary | ICD-10-CM | POA: Diagnosis not present

## 2016-12-18 DIAGNOSIS — Z8711 Personal history of peptic ulcer disease: Secondary | ICD-10-CM | POA: Diagnosis not present

## 2016-12-18 DIAGNOSIS — Z818 Family history of other mental and behavioral disorders: Secondary | ICD-10-CM

## 2016-12-18 DIAGNOSIS — I10 Essential (primary) hypertension: Secondary | ICD-10-CM | POA: Diagnosis present

## 2016-12-18 DIAGNOSIS — Z8261 Family history of arthritis: Secondary | ICD-10-CM | POA: Diagnosis not present

## 2016-12-18 DIAGNOSIS — E119 Type 2 diabetes mellitus without complications: Secondary | ICD-10-CM | POA: Diagnosis not present

## 2016-12-18 DIAGNOSIS — E1165 Type 2 diabetes mellitus with hyperglycemia: Secondary | ICD-10-CM | POA: Diagnosis present

## 2016-12-18 DIAGNOSIS — Z7984 Long term (current) use of oral hypoglycemic drugs: Secondary | ICD-10-CM | POA: Diagnosis not present

## 2016-12-18 DIAGNOSIS — Z811 Family history of alcohol abuse and dependence: Secondary | ICD-10-CM

## 2016-12-18 DIAGNOSIS — K317 Polyp of stomach and duodenum: Principal | ICD-10-CM | POA: Diagnosis present

## 2016-12-18 DIAGNOSIS — Z794 Long term (current) use of insulin: Secondary | ICD-10-CM

## 2016-12-18 DIAGNOSIS — Z6841 Body Mass Index (BMI) 40.0 and over, adult: Secondary | ICD-10-CM

## 2016-12-18 DIAGNOSIS — E039 Hypothyroidism, unspecified: Secondary | ICD-10-CM | POA: Diagnosis not present

## 2016-12-18 DIAGNOSIS — Z87891 Personal history of nicotine dependence: Secondary | ICD-10-CM

## 2016-12-18 DIAGNOSIS — Z8371 Family history of colonic polyps: Secondary | ICD-10-CM

## 2016-12-18 HISTORY — PX: LAPAROSCOPIC PARTIAL GASTRECTOMY: SHX5908

## 2016-12-18 LAB — GLUCOSE, CAPILLARY
Glucose-Capillary: 236 mg/dL — ABNORMAL HIGH (ref 65–99)
Glucose-Capillary: 249 mg/dL — ABNORMAL HIGH (ref 65–99)
Glucose-Capillary: 359 mg/dL — ABNORMAL HIGH (ref 65–99)
Glucose-Capillary: 349 mg/dL — ABNORMAL HIGH (ref 65–99)
Glucose-Capillary: 277 mg/dL — ABNORMAL HIGH (ref 65–99)

## 2016-12-18 LAB — HEMOGLOBIN AND HEMATOCRIT, BLOOD
HCT: 36.8 % (ref 36.0–46.0)
Hemoglobin: 11.9 g/dL — ABNORMAL LOW (ref 12.0–15.0)

## 2016-12-18 SURGERY — LAPAROSCOPIC PARTIAL GASTRECTOMY
Anesthesia: General

## 2016-12-18 MED ORDER — CHLORHEXIDINE GLUCONATE CLOTH 2 % EX PADS: 6.0000 | MEDICATED_PAD | Freq: Once | CUTANEOUS | Status: DC

## 2016-12-18 MED ORDER — PREMIER PROTEIN SHAKE
2.0000 [oz_av] | ORAL | Status: DC
  Administered 2016-12-19 – 2016-12-21 (×16): 2 [oz_av] via ORAL

## 2016-12-18 MED ORDER — PHENYLEPHRINE HCL 10 MG/ML IJ SOLN
INTRAMUSCULAR | Status: AC
  Filled 2016-12-18: qty 1

## 2016-12-18 MED ORDER — FENTANYL CITRATE (PF) 250 MCG/5ML IJ SOLN
INTRAMUSCULAR | Status: AC
  Filled 2016-12-18: qty 5

## 2016-12-18 MED ORDER — SUGAMMADEX SODIUM 500 MG/5ML IV SOLN
INTRAVENOUS | Status: AC
  Filled 2016-12-18: qty 5

## 2016-12-18 MED ORDER — FENTANYL CITRATE (PF) 100 MCG/2ML IJ SOLN
25.0000 ug | INTRAMUSCULAR | Status: DC | PRN
  Administered 2016-12-18 (×4): 50 ug via INTRAVENOUS

## 2016-12-18 MED ORDER — FENTANYL CITRATE (PF) 100 MCG/2ML IJ SOLN
INTRAMUSCULAR | Status: DC | PRN
  Administered 2016-12-18 (×5): 50 ug via INTRAVENOUS

## 2016-12-18 MED ORDER — CEFOTETAN DISODIUM-DEXTROSE 2-2.08 GM-% IV SOLR
2.0000 g | INTRAVENOUS | Status: AC
  Administered 2016-12-18: 2 g via INTRAVENOUS
  Filled 2016-12-18: qty 50

## 2016-12-18 MED ORDER — BUPIVACAINE HCL (PF) 0.5 % IJ SOLN
INTRAMUSCULAR | Status: AC
  Filled 2016-12-18: qty 30

## 2016-12-18 MED ORDER — LEVOTHYROXINE SODIUM 50 MCG PO TABS
50.0000 ug | ORAL_TABLET | Freq: Every day | ORAL | Status: DC
  Administered 2016-12-19 – 2016-12-20 (×2): 50 ug via ORAL
  Filled 2016-12-18 (×3): qty 1

## 2016-12-18 MED ORDER — LOSARTAN POTASSIUM-HCTZ 50-12.5 MG PO TABS: 1.0000 | ORAL_TABLET | Freq: Every day | ORAL | Status: DC

## 2016-12-18 MED ORDER — ROCURONIUM BROMIDE 50 MG/5ML IV SOSY
PREFILLED_SYRINGE | INTRAVENOUS | Status: DC | PRN
  Administered 2016-12-18: 20 mg via INTRAVENOUS
  Administered 2016-12-18: 50 mg via INTRAVENOUS
  Administered 2016-12-18: 10 mg via INTRAVENOUS
  Administered 2016-12-18 (×3): 20 mg via INTRAVENOUS

## 2016-12-18 MED ORDER — SODIUM CHLORIDE 0.9 % IJ SOLN
INTRAMUSCULAR | Status: DC | PRN
  Administered 2016-12-18: 50 mL

## 2016-12-18 MED ORDER — PHENYLEPHRINE HCL-NACL 10-0.9 MG/250ML-% IV SOLN
INTRAVENOUS | Status: AC
  Filled 2016-12-18: qty 250

## 2016-12-18 MED ORDER — PHENYLEPHRINE 40 MCG/ML (10ML) SYRINGE FOR IV PUSH (FOR BLOOD PRESSURE SUPPORT)
PREFILLED_SYRINGE | INTRAVENOUS | Status: AC
  Filled 2016-12-18: qty 10

## 2016-12-18 MED ORDER — LACTATED RINGERS IV SOLN
INTRAVENOUS | Status: AC | PRN
  Administered 2016-12-18: 1

## 2016-12-18 MED ORDER — INSULIN ASPART 100 UNIT/ML ~~LOC~~ SOLN
5.0000 [IU] | Freq: Once | SUBCUTANEOUS | Status: AC
  Administered 2016-12-18: 5 [IU] via SUBCUTANEOUS

## 2016-12-18 MED ORDER — ACETAMINOPHEN 160 MG/5ML PO SOLN
325.0000 mg | ORAL | Status: DC | PRN
  Administered 2016-12-20: 650 mg via ORAL
  Administered 2016-12-20: 325 mg via ORAL
  Filled 2016-12-18 (×2): qty 20.3

## 2016-12-18 MED ORDER — BUPIVACAINE-EPINEPHRINE 0.25% -1:200000 IJ SOLN
INTRAMUSCULAR | Status: AC
  Filled 2016-12-18: qty 1

## 2016-12-18 MED ORDER — SUGAMMADEX SODIUM 200 MG/2ML IV SOLN
INTRAVENOUS | Status: DC | PRN
  Administered 2016-12-18: 280 mg via INTRAVENOUS

## 2016-12-18 MED ORDER — SODIUM CHLORIDE 0.9 % IV SOLN
INTRAVENOUS | Status: DC | PRN
  Administered 2016-12-18: 25 ug/min via INTRAVENOUS

## 2016-12-18 MED ORDER — ONDANSETRON HCL 4 MG/2ML IJ SOLN
INTRAMUSCULAR | Status: DC | PRN
  Administered 2016-12-18: 4 mg via INTRAVENOUS

## 2016-12-18 MED ORDER — KETAMINE HCL 10 MG/ML IJ SOLN
INTRAMUSCULAR | Status: DC | PRN
  Administered 2016-12-18: 30 mg via INTRAVENOUS

## 2016-12-18 MED ORDER — INFLUENZA VAC SPLIT QUAD 0.5 ML IM SUSY
0.5000 mL | PREFILLED_SYRINGE | INTRAMUSCULAR | Status: DC
  Filled 2016-12-18: qty 0.5

## 2016-12-18 MED ORDER — ONDANSETRON HCL 4 MG/2ML IJ SOLN: 4.0000 mg | INTRAMUSCULAR | Status: DC | PRN

## 2016-12-18 MED ORDER — PHENYLEPHRINE HCL 10 MG/ML IJ SOLN
INTRAMUSCULAR | Status: DC | PRN
  Administered 2016-12-18 (×4): 40 ug via INTRAVENOUS

## 2016-12-18 MED ORDER — MIDAZOLAM HCL 5 MG/5ML IJ SOLN
INTRAMUSCULAR | Status: DC | PRN
  Administered 2016-12-18: 2 mg via INTRAVENOUS

## 2016-12-18 MED ORDER — MORPHINE SULFATE (PF) 2 MG/ML IV SOLN
1.0000 mg | INTRAVENOUS | Status: DC | PRN
  Administered 2016-12-18 – 2016-12-19 (×9): 3 mg via INTRAVENOUS
  Administered 2016-12-20: 1 mg via INTRAVENOUS
  Filled 2016-12-18 (×7): qty 2
  Filled 2016-12-18: qty 1
  Filled 2016-12-18 (×2): qty 2

## 2016-12-18 MED ORDER — APREPITANT 40 MG PO CAPS
40.0000 mg | ORAL_CAPSULE | ORAL | Status: AC
  Administered 2016-12-18: 40 mg via ORAL
  Filled 2016-12-18: qty 1

## 2016-12-18 MED ORDER — POTASSIUM CHLORIDE IN NACL 20-0.9 MEQ/L-% IV SOLN
INTRAVENOUS | Status: DC
  Administered 2016-12-19 – 2016-12-20 (×4): via INTRAVENOUS
  Filled 2016-12-18 (×6): qty 1000

## 2016-12-18 MED ORDER — LACTATED RINGERS IV SOLN
INTRAVENOUS | Status: DC
  Administered 2016-12-18 (×4): via INTRAVENOUS

## 2016-12-18 MED ORDER — FENTANYL CITRATE (PF) 100 MCG/2ML IJ SOLN
INTRAMUSCULAR | Status: AC
  Filled 2016-12-18: qty 4

## 2016-12-18 MED ORDER — SODIUM CHLORIDE 0.9 % IJ SOLN
INTRAMUSCULAR | Status: AC
  Filled 2016-12-18: qty 50

## 2016-12-18 MED ORDER — BUPIVACAINE LIPOSOME 1.3 % IJ SUSP
20.0000 mL | Freq: Once | INTRAMUSCULAR | Status: AC
  Administered 2016-12-18: 20 mL
  Filled 2016-12-18: qty 20

## 2016-12-18 MED ORDER — EVICEL 5 ML EX KIT
PACK | CUTANEOUS | Status: DC | PRN
  Administered 2016-12-18: 1

## 2016-12-18 MED ORDER — PNEUMOCOCCAL VAC POLYVALENT 25 MCG/0.5ML IJ INJ
0.5000 mL | INJECTION | INTRAMUSCULAR | Status: DC
  Filled 2016-12-18: qty 0.5

## 2016-12-18 MED ORDER — HYDRALAZINE HCL 20 MG/ML IJ SOLN: 10.0000 mg | INTRAMUSCULAR | Status: DC | PRN

## 2016-12-18 MED ORDER — 0.9 % SODIUM CHLORIDE (POUR BTL) OPTIME
TOPICAL | Status: DC | PRN
  Administered 2016-12-18: 2000 mL

## 2016-12-18 MED ORDER — DEXAMETHASONE SODIUM PHOSPHATE 10 MG/ML IJ SOLN
INTRAMUSCULAR | Status: AC
  Filled 2016-12-18: qty 1

## 2016-12-18 MED ORDER — INSULIN ASPART 100 UNIT/ML ~~LOC~~ SOLN
0.0000 [IU] | SUBCUTANEOUS | Status: DC
  Administered 2016-12-18: 15 [IU] via SUBCUTANEOUS
  Administered 2016-12-18: 11 [IU] via SUBCUTANEOUS
  Administered 2016-12-19 (×2): 4 [IU] via SUBCUTANEOUS
  Administered 2016-12-19: 7 [IU] via SUBCUTANEOUS
  Administered 2016-12-19: 4 [IU] via SUBCUTANEOUS
  Administered 2016-12-19: 7 [IU] via SUBCUTANEOUS
  Administered 2016-12-19: 4 [IU] via SUBCUTANEOUS
  Administered 2016-12-20: 7 [IU] via SUBCUTANEOUS
  Administered 2016-12-20: 20 [IU] via SUBCUTANEOUS
  Administered 2016-12-20 (×2): 4 [IU] via SUBCUTANEOUS
  Administered 2016-12-20: 7 [IU] via SUBCUTANEOUS
  Administered 2016-12-20: 4 [IU] via SUBCUTANEOUS
  Administered 2016-12-20: 11 [IU] via SUBCUTANEOUS
  Administered 2016-12-21 (×2): 7 [IU] via SUBCUTANEOUS

## 2016-12-18 MED ORDER — ROCURONIUM BROMIDE 50 MG/5ML IV SOSY
PREFILLED_SYRINGE | INTRAVENOUS | Status: AC
  Filled 2016-12-18: qty 5

## 2016-12-18 MED ORDER — BUPIVACAINE-EPINEPHRINE (PF) 0.25% -1:200000 IJ SOLN
INTRAMUSCULAR | Status: DC | PRN
  Administered 2016-12-18: 12 mL via PERINEURAL

## 2016-12-18 MED ORDER — ACETAMINOPHEN 325 MG PO TABS
650.0000 mg | ORAL_TABLET | ORAL | Status: DC | PRN
  Administered 2016-12-20: 650 mg via ORAL
  Filled 2016-12-18: qty 2

## 2016-12-18 MED ORDER — PROMETHAZINE HCL 25 MG/ML IJ SOLN
INTRAMUSCULAR | Status: AC
  Filled 2016-12-18: qty 1

## 2016-12-18 MED ORDER — PANTOPRAZOLE SODIUM 40 MG IV SOLR
40.0000 mg | Freq: Every day | INTRAVENOUS | Status: DC
  Administered 2016-12-18 – 2016-12-20 (×3): 40 mg via INTRAVENOUS
  Filled 2016-12-18 (×3): qty 40

## 2016-12-18 MED ORDER — HEPARIN SODIUM (PORCINE) 5000 UNIT/ML IJ SOLN
5000.0000 [IU] | INTRAMUSCULAR | Status: AC
  Administered 2016-12-18: 5000 [IU] via SUBCUTANEOUS
  Filled 2016-12-18: qty 1

## 2016-12-18 MED ORDER — ENOXAPARIN SODIUM 30 MG/0.3ML ~~LOC~~ SOLN
30.0000 mg | Freq: Two times a day (BID) | SUBCUTANEOUS | Status: DC
  Administered 2016-12-19 – 2016-12-21 (×5): 30 mg via SUBCUTANEOUS
  Filled 2016-12-18 (×5): qty 0.3

## 2016-12-18 MED ORDER — EVICEL 5 ML EX KIT
PACK | Freq: Once | CUTANEOUS | Status: DC
  Filled 2016-12-18: qty 1

## 2016-12-18 MED ORDER — METOPROLOL TARTRATE 50 MG PO TABS
50.0000 mg | ORAL_TABLET | Freq: Two times a day (BID) | ORAL | Status: DC
  Administered 2016-12-18 – 2016-12-21 (×6): 50 mg via ORAL
  Filled 2016-12-18 (×6): qty 1

## 2016-12-18 MED ORDER — LIDOCAINE 2% (20 MG/ML) 5 ML SYRINGE
INTRAMUSCULAR | Status: AC
  Filled 2016-12-18: qty 5

## 2016-12-18 MED ORDER — PROMETHAZINE HCL 25 MG/ML IJ SOLN: 6.2500 mg | INTRAMUSCULAR | Status: DC | PRN

## 2016-12-18 MED ORDER — LOSARTAN POTASSIUM 50 MG PO TABS
50.0000 mg | ORAL_TABLET | Freq: Every day | ORAL | Status: DC
  Administered 2016-12-18 – 2016-12-21 (×4): 50 mg via ORAL
  Filled 2016-12-18 (×4): qty 1

## 2016-12-18 MED ORDER — INSULIN ASPART 100 UNIT/ML ~~LOC~~ SOLN
SUBCUTANEOUS | Status: AC
  Filled 2016-12-18: qty 1

## 2016-12-18 MED ORDER — SUCCINYLCHOLINE CHLORIDE 200 MG/10ML IV SOSY
PREFILLED_SYRINGE | INTRAVENOUS | Status: DC | PRN
  Administered 2016-12-18: 140 mg via INTRAVENOUS

## 2016-12-18 MED ORDER — STERILE WATER FOR IRRIGATION IR SOLN
Status: DC | PRN
  Administered 2016-12-18: 2000 mL

## 2016-12-18 MED ORDER — HYDROCHLOROTHIAZIDE 12.5 MG PO CAPS
12.5000 mg | ORAL_CAPSULE | Freq: Every day | ORAL | Status: DC
  Administered 2016-12-18 – 2016-12-21 (×4): 12.5 mg via ORAL
  Filled 2016-12-18 (×4): qty 1

## 2016-12-18 MED ORDER — OXYCODONE HCL 5 MG/5ML PO SOLN
5.0000 mg | ORAL | Status: DC | PRN
  Administered 2016-12-20 (×2): 5 mg via ORAL
  Administered 2016-12-20: 10 mg via ORAL
  Administered 2016-12-20: 5 mg via ORAL
  Administered 2016-12-21: 10 mg via ORAL
  Filled 2016-12-18 (×4): qty 5
  Filled 2016-12-18 (×2): qty 10

## 2016-12-18 MED ORDER — MIDAZOLAM HCL 2 MG/2ML IJ SOLN
INTRAMUSCULAR | Status: AC
  Filled 2016-12-18: qty 2

## 2016-12-18 MED ORDER — INSULIN GLARGINE 100 UNIT/ML ~~LOC~~ SOLN
10.0000 [IU] | Freq: Every day | SUBCUTANEOUS | Status: DC
  Administered 2016-12-18: 10 [IU] via SUBCUTANEOUS
  Filled 2016-12-18: qty 0.1

## 2016-12-18 MED ORDER — SUCCINYLCHOLINE CHLORIDE 200 MG/10ML IV SOSY
PREFILLED_SYRINGE | INTRAVENOUS | Status: AC
  Filled 2016-12-18: qty 10

## 2016-12-18 MED ORDER — ONDANSETRON HCL 4 MG/2ML IJ SOLN
INTRAMUSCULAR | Status: AC
  Filled 2016-12-18: qty 2

## 2016-12-18 MED ORDER — PROPOFOL 10 MG/ML IV BOLUS
INTRAVENOUS | Status: AC
  Filled 2016-12-18: qty 40

## 2016-12-18 MED ORDER — LIDOCAINE 2% (20 MG/ML) 5 ML SYRINGE
INTRAMUSCULAR | Status: DC | PRN
  Administered 2016-12-18: 100 mg via INTRAVENOUS

## 2016-12-18 MED ORDER — PROPOFOL 10 MG/ML IV BOLUS
INTRAVENOUS | Status: DC | PRN
  Administered 2016-12-18: 200 mg via INTRAVENOUS

## 2016-12-18 MED ORDER — DEXAMETHASONE SODIUM PHOSPHATE 10 MG/ML IJ SOLN
INTRAMUSCULAR | Status: DC | PRN
  Administered 2016-12-18: 5 mg via INTRAVENOUS

## 2016-12-18 SURGICAL SUPPLY — 90 items
APPLIER CLIP ROT 10 11.4 M/L (STAPLE) ×3
BLADE 10 SAFETY STRL DISP (BLADE) ×3 IMPLANT
BLADE HEX COATED 2.75 (ELECTRODE) ×3 IMPLANT
CLIP APPLIE ROT 10 11.4 M/L (STAPLE) ×1 IMPLANT
CLIP SUT LAPRA TY ABSORB (SUTURE) ×9 IMPLANT
COVER MAYO STAND STRL (DRAPES) ×3 IMPLANT
DERMABOND ADVANCED (GAUZE/BANDAGES/DRESSINGS)
DERMABOND ADVANCED .7 DNX12 (GAUZE/BANDAGES/DRESSINGS) IMPLANT
DRAIN CHANNEL 19F RND (DRAIN) ×3 IMPLANT
DRAIN PENROSE .75X.25X12 SILI (WOUND CARE) ×3 IMPLANT
DRAPE LAPAROSCOPIC ABDOMINAL (DRAPES) ×3 IMPLANT
DRAPE WARM FLUID 44X44 (DRAPE) ×3 IMPLANT
ELECT PENCIL ROCKER SW 15FT (MISCELLANEOUS) ×6 IMPLANT
ELECT REM PT RETURN 15FT ADLT (MISCELLANEOUS) ×3 IMPLANT
ENDOLOOP SUT PDS II  0 18 (SUTURE)
ENDOLOOP SUT PDS II 0 18 (SUTURE) IMPLANT
EVACUATOR SILICONE 100CC (DRAIN) ×3 IMPLANT
GAUZE SPONGE 2X2 8PLY STRL LF (GAUZE/BANDAGES/DRESSINGS) ×1 IMPLANT
GAUZE SPONGE 4X4 12PLY STRL (GAUZE/BANDAGES/DRESSINGS) ×3 IMPLANT
GLOVE BIO SURGEON STRL SZ7 (GLOVE) ×3 IMPLANT
GLOVE BIOGEL PI IND STRL 7.0 (GLOVE) ×1 IMPLANT
GLOVE BIOGEL PI IND STRL 7.5 (GLOVE) ×1 IMPLANT
GLOVE BIOGEL PI INDICATOR 7.0 (GLOVE) ×2
GLOVE BIOGEL PI INDICATOR 7.5 (GLOVE) ×2
GOWN STRL REUS W/ TWL XL LVL3 (GOWN DISPOSABLE) ×1 IMPLANT
GOWN STRL REUS W/TWL LRG LVL3 (GOWN DISPOSABLE) ×6 IMPLANT
GOWN STRL REUS W/TWL XL LVL3 (GOWN DISPOSABLE) ×5 IMPLANT
HEMOSTAT SNOW SURGICEL 2X4 (HEMOSTASIS) ×6 IMPLANT
KIT BASIN OR (CUSTOM PROCEDURE TRAY) ×3 IMPLANT
MARKER SKIN DUAL TIP RULER LAB (MISCELLANEOUS) IMPLANT
NS IRRIG 1000ML POUR BTL (IV SOLUTION) ×6 IMPLANT
PAD POSITIONING PINK XL (MISCELLANEOUS) IMPLANT
PORT LAP GEL ALEXIS MED 5-9CM (MISCELLANEOUS) ×3 IMPLANT
POSITIONER SURGICAL ARM (MISCELLANEOUS) IMPLANT
POUCH SPECIMEN RETRIEVAL 10MM (ENDOMECHANICALS) IMPLANT
RELOAD 45 VASCULAR/THIN (ENDOMECHANICALS) ×3 IMPLANT
RELOAD ENDO STITCH 2.0 (ENDOMECHANICALS) ×18
RELOAD STAPLE TA45 3.5 REG BLU (ENDOMECHANICALS) ×6 IMPLANT
RELOAD STAPLER GOLD 60MM (STAPLE) ×4 IMPLANT
RELOAD STAPLER GREEN 60MM (STAPLE) ×2 IMPLANT
RELOAD STAPLER WHITE 60MM (STAPLE) ×1 IMPLANT
SCISSORS LAP 5X35 DISP (ENDOMECHANICALS) ×3 IMPLANT
SEALER TISSUE X1 CVD JAW (INSTRUMENTS) IMPLANT
SET IRRIG TUBING LAPAROSCOPIC (IRRIGATION / IRRIGATOR) ×3 IMPLANT
SHEARS CURVED HARMONIC AC 45CM (MISCELLANEOUS) ×6 IMPLANT
SHEARS HARMONIC ACE PLUS 36CM (ENDOMECHANICALS) IMPLANT
SLEEVE ADV FIXATION 12X100MM (TROCAR) ×6 IMPLANT
SPONGE GAUZE 2X2 STER 10/PKG (GAUZE/BANDAGES/DRESSINGS) ×2
SPONGE LAP 18X18 X RAY DECT (DISPOSABLE) ×6 IMPLANT
STAPLE ECHEON FLEX 60 POW ENDO (STAPLE) IMPLANT
STAPLER 90 3.5 STAND SLIM (STAPLE)
STAPLER 90 3.5 STD SLIM (STAPLE) IMPLANT
STAPLER RELOAD GOLD 60MM (STAPLE) ×12
STAPLER RELOAD GREEN 60MM (STAPLE) ×6
STAPLER RELOAD WHITE 60MM (STAPLE) ×3
STAPLER VISISTAT 35W (STAPLE) ×3 IMPLANT
SUT ETHILON 2 0 PS N (SUTURE) ×3 IMPLANT
SUT MNCRL AB 4-0 PS2 18 (SUTURE) IMPLANT
SUT PDS AB 1 CT1 27 (SUTURE) ×18 IMPLANT
SUT RELOAD ENDO STITCH 2 48X1 (ENDOMECHANICALS) ×4
SUT RELOAD ENDO STITCH 2.0 (ENDOMECHANICALS) ×5
SUT SILK 2 0 (SUTURE) ×2
SUT SILK 2 0 SH CR/8 (SUTURE) ×3 IMPLANT
SUT SILK 2-0 18XBRD TIE 12 (SUTURE) ×1 IMPLANT
SUT SILK 3 0 (SUTURE)
SUT SILK 3 0 SH CR/8 (SUTURE) IMPLANT
SUT SILK 3-0 18XBRD TIE 12 (SUTURE) IMPLANT
SUT VIC AB 2-0 SH 27 (SUTURE) ×2
SUT VIC AB 2-0 SH 27X BRD (SUTURE) ×1 IMPLANT
SUTURE RELOAD END STTCH 2 48X1 (ENDOMECHANICALS) ×4 IMPLANT
SUTURE RELOAD ENDO STITCH 2.0 (ENDOMECHANICALS) ×5 IMPLANT
TAPE CLOTH 4X10 WHT NS (GAUZE/BANDAGES/DRESSINGS) IMPLANT
TAPE CLOTH SURG 4X10 WHT LF (GAUZE/BANDAGES/DRESSINGS) ×3 IMPLANT
TIP INNERVISION DETACH 40FR (MISCELLANEOUS) IMPLANT
TIP INNERVISION DETACH 50FR (MISCELLANEOUS) IMPLANT
TIP INNERVISION DETACH 56FR (MISCELLANEOUS) IMPLANT
TIP RIGID 35CM EVICEL (HEMOSTASIS) ×3 IMPLANT
TIPS INNERVISION DETACH 40FR (MISCELLANEOUS)
TOWEL OR 17X26 10 PK STRL BLUE (TOWEL DISPOSABLE) ×6 IMPLANT
TRAY FOLEY W/METER SILVER 16FR (SET/KITS/TRAYS/PACK) ×3 IMPLANT
TRAY LAPAROSCOPIC (CUSTOM PROCEDURE TRAY) ×3 IMPLANT
TROCAR ADV FIXATION 12X100MM (TROCAR) ×3 IMPLANT
TROCAR ADV FIXATION 5X100MM (TROCAR) ×3 IMPLANT
TROCAR BLADELESS OPT 5 100 (ENDOMECHANICALS) ×3 IMPLANT
TROCAR XCEL 12X100 BLDLESS (ENDOMECHANICALS) ×3 IMPLANT
TROCAR XCEL BLUNT TIP 100MML (ENDOMECHANICALS) IMPLANT
TROCAR XCEL NON-BLD 11X100MML (ENDOMECHANICALS) IMPLANT
TROCAR XCEL UNIV SLVE 11M 100M (ENDOMECHANICALS) IMPLANT
TUBING INSUF HEATED (TUBING) ×3 IMPLANT
YANKAUER SUCT BULB TIP 10FT TU (MISCELLANEOUS) ×3 IMPLANT

## 2016-12-18 NOTE — Anesthesia Procedure Notes (Signed)
Procedure Name: Intubation Date/Time: 12/18/2016 10:25 AM Performed by: Maxwell Caul Pre-anesthesia Checklist: Patient identified, Emergency Drugs available, Suction available and Patient being monitored Patient Re-evaluated:Patient Re-evaluated prior to induction Oxygen Delivery Method: Circle system utilized Preoxygenation: Pre-oxygenation with 100% oxygen Induction Type: IV induction Ventilation: Mask ventilation without difficulty Laryngoscope Size: Mac and 4 Grade View: Grade I Tube type: Oral Tube size: 7.5 mm Number of attempts: 1 Airway Equipment and Method: Stylet and Oral airway Placement Confirmation: ETT inserted through vocal cords under direct vision,  positive ETCO2 and breath sounds checked- equal and bilateral Secured at: 23 cm Tube secured with: Tape Dental Injury: Teeth and Oropharynx as per pre-operative assessment

## 2016-12-18 NOTE — Op Note (Signed)
Preoperative Diagnosis: Gastric Polyp, DM II, HTN, Morbid Obesity  Postoprative Diagnosis: Gastric Polyp, DM II, HTN, Morbid Obesity  Procedure: Procedure(s): LAPAROSCOPIC PARTIAL GASTRECTOMY WITH ROUX-EN-Y RECONSTRUCTION   Surgeon: Excell Seltzer T   Assistants: Gurney Maxin  Anesthesia:  General endotracheal anesthesia  Indications: patient is a 61 year old female with morbid obesity and multiple comorbidities with history of gastric ulcer. This presented with bleeding. This healed with medical management but subsequent endoscopy has revealed a large polypoid mass in the distal antrum almost circumferential. Biopsy shows benign hyperplastic polyp. This was felt to be partially obstructing. The patient has had an extensive workup and discussion on a number of occasions regarding management and after extensive consultation detailed elsewhere and extensive discussion of operative risks detailed elsewhere we have elected to proceed with laparoscopic distal gastrectomy for resection of her polyp with Roux-en-Y reconstruction.    Procedure Detail:  Patient was taken to the operating room, placed in the supine position on the operating table, and general endotracheal anesthesia induced. She received preoperative IV antibiotics. PAS were placed. She received preoperative subcutaneous heparin. The abdomen was widely sterilely prepped and draped. Foley catheter was placed. Patient timeout was performed and correct procedure verified. Access was obtained with a 12 mm trocar in the left upper quadrant with opticaltechnique without difficulty and pneumoperitoneum established. No evidence of trocar injury. Under direct vision 12 mm trochars were placed laterally in the right upper quadrant, in the right mid abdomen and just above and to the left emboli construe the camera port. A 5 mm trocar was placed in the left flank. The liver was slightly enlarged and fatty. Stomach appeared grossly normal. We  initially created the Roux limb. The omentum was elevated and the ligament of Treitz identified. A 40 cm biliopancreatic limb was measured and the small bowel divided with a single firing of the 60 mm white load stapler. The mesentery was divided a short distance further with the Harmonic scalpel. The end of the Roux limb was marked by suturing a Penrose to it. A 60 cm Roux limb was then measured. At this point an anastomosis was created side to side near the end of the biliopancreatic limb and the side of the Roux limb with a single firing of the Endo GIA 45 mm white load stapler. The staple line was intact and without bleeding. The common enterotomy was closed from either end with running 3-0 Vicryl. This appeared widely patent with good blood supply. The mesenteric defect was closed with running 2-0 silk. The omentum was divided with the Harmonic scalpel along the path of the Roux limb. The patient was placed in steep reverse Trendelenburg and the liver elevated and stomach exposed with the [facility] retractor. Initially the greater curve was dissected dividing vasculature with the Harmonic scalpel and the lesser sac entered. The dissection continued proximally to my planned point of resection at the proximal fundus. The dissection was then carried somewhat distally toward the pylorus. A point of resection along the lesser curve was chosen and and the lesser curve was dissected using Harmonic scalpel working toward the lesser sac which was entered without difficulty.All tubes were removed from the stomach. This represented an approximately two thirds gastrectomy. The stomach was divided at right angles to the lesser curve initially with a firing of the gold load 60 mm echelon stapler. The proximal gastric division was then completed with an additional firing of the 60 mm gold load stapler and the 60 mm blue load stapler completing the division.  The distal gastric specimen was then dissected with the Harmonic  scalpel working distallydividing avascular posterior attachments andcarefully dividing vascular superior and inferior attachments to the stomach.The dissection progresseddistally. The pylorus was identified and we dissectedat least 1 cm below the pylorus clearly into the first portion of the duodenum. This was completely dissected and then divided with a single firing of the gold load 60 mm echelon stapler. There was some slight bleeding around the staple line which was controlled with clips. Complete hemostasis was obtained and the duodenal stump was coated with Evicel. The gastric specimen was placed in a 15 cm retrieval bag. We then lengthened the incisionat the right midabdominal port siteabout a centimeter and a half in either direction and the fascia and peritoneum divided and the wound protector placed. The specimen was brought out through the incision without difficulty. The specimen was opened on the back table and the large polyp was well away from the proximal and distal margins. The cap was placed on the wound protector and the trocar replaced.Complete hemostasis was assured. The Roux limb was then brought up without any tension but the candycane facing to the left and the gastrojejunostomy created with a posterior row of running 2-0 Vicryl initially The Ewald tube was advanced to the lesser curve tip of the gastric pouch and enterotomies made with the Harmonic scalpel in the gastric pouch and the Roux limb. The anastomosis was created with a single firing of the blue load 45 mm stapler fully advanced. The staple line was intact without bleeding. The common enterotomy was closed from either end with running 2-0 Vicryl. The Ewald tube was advanced through the anastomosis without difficulty and an anterior row of running 2-0 Vicryl was placed. Following this Dr. Kieth Brightly performed upper endoscopy showing no bleeding, patent anastomosis and no leak underinsufflation and saline irrigation. A 19 Blake closed  suction drain was brought out through the right lateral trocar site and left over the anastomosis and duodenal stump. The wound protector was removed and the fascial defect closed with interrupted #1 Novafil. The abdomen was reinsufflated. The Nathanson retractor was removed under direct vision. At this point it was noted that the retractor had caused a approximately3 cm laceration in thefar lateral left lobe likely at the time it was repositioned. There was no active bleeding.This was packed withSurgicel and observed closely with no evidence of bleeding. The abdomen was again carefully inspected. All CO2 was evacuated and trochars removed. Skin incisions were closed with interrupted 2-0 nylon. Sponge needle and instrument counts were correct.    Findings: As above  Estimated Blood Loss:  200 mL         Drains: 19 Blake drainin the upper abdomen  Blood Given: none          Specimens: distal gastrectomy        Complications:  * No complications entered in OR log *         Disposition: PACU - hemodynamically stable.         Condition: stable

## 2016-12-18 NOTE — H&P (Signed)
History of Present Illness Marland Kitchen T. Jeson Camacho MD; 11/30/2016 1:48 PM) The patient is a 61 year old female who presents with a complaint of Mass. Patient returns for follow-up of her large hyperplastic polyp of the distal stomach. Her presentation initially was as follows:  Patient was referred by Dr. Oletta Lamas for a large gastric polyp. I initially saw her in March of this year. Patient has a history of a gastric ulcer diagnosed in mid 2017 when she presented with anemia and abdominal pain. At that point she also had multiple colonic polyps. She was treated medically following upper endoscopy and colonoscopy with polypectomy. Her symptoms of anemia and fatigue and abdominal pain resolved. She subsequently underwent repeat colonoscopy which was essentially negative with one small polyp and repeat upper endoscopy. This was March 01, 2016. Findings were significant for a fairly large polypoid mass measuring about 4 cm in the distal antrum at the site of her previous ulcer and occupying about one third of the circumference of the stomach. Multiple biopsies were obtained showing a hyperplastic polyp with no evidence of malignancy or dysplasia. She then subsequently underwent upper endoscopic ultrasound by Dr. Paulita Fujita January 17 with findings of a lobulated intramural lesion in the antrum with mucosal hypertrophy and the lesion originating from the deep mucosa without invasion through the gastric wall. No evidence of adenopathy or other abnormalities. Again biopsies were obtained showing hyperplastic polyp. After evaluation in March and discussion with Dr. Oletta Lamas I felt that attempted endoscopic resection might be a good alternative and she was referred to [facility] to have this done. The patient and her husband report that it was scheduled and she underwent an upper endoscopy but no procedure was done and the physician recommended that they follow it with a repeat endoscopy in several months. She was very  disappointed in this outcome and strongly desires having resected. I do not have access to those detailed records from [place] today. She continues to have no GI symptoms, specifically pain or melena or nausea.  I since reviewed the records from [place] and talk to Dr. Oletta Lamas. It is not specifically clear from the records but apparently the endoscopist felt the polyp was too large to remove endoscopically. The patient says she was told that if it could not be done endoscopically she would be transferred immediately to the operating room for surgical resection but this obviously was not done.   Problem List/Past Medical Marland Kitchen T. Aul Mangieri, MD; 11/30/2016 1:48 PM) GASTRIC POLYP (K31.7)   Past Surgical History Marland Kitchen T. Klayton Monie, MD; 11/30/2016 1:48 PM) Appendectomy  Cesarean Section - 1  Colon Polyp Removal - Colonoscopy  Gallbladder Surgery - Laparoscopic  Ventral / Umbilical Hernia Surgery  Bilateral.  Diagnostic Studies History Marland Kitchen T. Soley Harriss, MD; 11/30/2016 1:48 PM) Mammogram  within last year Pap Smear  1-5 years ago  Allergies Marland Kitchen T. Micheal Sheen, MD; 11/30/2016 1:48 PM) No Known Drug Allergies 06/13/2016 Allergies Reconciled   Medication History Marland Kitchen T. Quinne Pires, MD; 11/30/2016 1:48 PM) Hydrocodone-Acetaminophen (5-325MG  Tablet, Oral as needed) Active. BD Pen Needle Ultrafine (29G X 12.7MM Misc,) Active. Gabapentin (300MG  Capsule, Oral two times daily) Active. Venlafaxine HCl ER (225MG  Tablet ER 24HR, Oral daily) Active. Rosuvastatin Calcium (40MG  Tablet, Oral daily) Active. Metoprolol Tartrate (50MG  Tablet, Oral two times daily) Active. MetFORMIN HCl (1000MG  Tablet, Oral two times daily) Active. Losartan Potassium-HCTZ (50-12.5MG  Tablet, Oral daily) Active. Levothyroxine Sodium (50MCG Tablet, Oral daily) Active. Levemir FlexTouch (100UNIT/ML Soln Pen-inj, Subcutaneous) Active. Januvia (100MG  Tablet, Oral daily) Active. HydroCHLOROthiazide  (  12.5MG  Tablet, Oral daily) Active. HumaLOG Mix 75/25 KwikPen ((75-25) 100UNIT/ML Susp Pen-inj, Subcutaneous) Active. Fluvirin (0.5ML Susp Pref Syr, Intramuscular) Active. BuPROPion HCl ER (XL) (300MG  Tablet ER 24HR, Oral daily) Active. PEG 3350-KCl-Na Bicarb-NaCl (420GM For Solution, Oral) Active. Multi-Minerals (Oral daily) Active. Vitamin D3 (1000UNIT Capsule, Oral daily) Active. (3 tablets daily) Fish Oil (1200MG  Capsule, Oral daily) Active. (4 tablets daily)  Social History Marland Kitchen T. Rissa Turley, MD; 11/30/2016 1:48 PM) Alcohol use  Occasional alcohol use. Illicit drug use  Prefer to discuss with provider. Tobacco use  Former smoker.  Family History Marland Kitchen T. Davian Hanshaw, MD; 11/30/2016 1:48 PM) Alcohol Abuse  Father. Arthritis  Mother. Colon Polyps  Father. Depression  Daughter. Diabetes Mellitus  Mother, Sister. Hypertension  Father, Mother, Sister.  Pregnancy / Birth History Marland Kitchen T. Kempton Milne, MD; 11/30/2016 1:48 PM) Age at menarche  17 years. Age of menopause  58-50 Gravida  3 Maternal age  73-20 Para  2 Regular periods   Other Problems Marland Kitchen T. Dainelle Hun, MD; 11/30/2016 1:48 PM) Anxiety Disorder  Arthritis  Back Pain  Cerebrovascular Accident  Cholelithiasis  Depression  Diabetes Mellitus  Gastric Ulcer  Heart murmur  High blood pressure  Hypercholesterolemia  Inguinal Hernia  Sleep Apnea   Vitals (Janette Ranson CMA; 11/30/2016 1:29 PM) 11/30/2016 1:28 PM Weight: 314 lb Height: 62in Body Surface Area: 2.32 m Body Mass Index: 57.43 kg/m  Pulse: 83 (Regular)  BP: 140/90 (Sitting, Left Arm, Standard)       Physical Exam Marland Kitchen T. Jillienne Egner MD; 11/30/2016 1:49 PM) The physical exam findings are as follows: Note:General: Alert, morbidly obese Caucasian female, in no distress Skin: Warm and dry without rash or infection. HEENT: No palpable masses or thyromegaly. Sclera nonicteric. Pupils equal round and  reactive. Lymph nodes: No cervical, supraclavicular, or inguinal nodes palpable. Lungs: Breath sounds clear and equal. No wheezing or increased work of breathing. Cardiovascular: Regular rate and rhythm, 3/6 systolic murmur. No JVD or edema. Abdomen: Obese. Nondistended. Soft and nontender. No masses palpable. No organomegaly. No palpable hernias. Extremities: No edema or joint swelling or deformity. No chronic venous stasis changes. Neurologic: Alert and fully oriented. Gait normal. No focal weakness. Psychiatric: Normal mood and affect. Thought content appropriate with normal judgement and insight    Assessment & Plan Marland Kitchen T. Miel Wisener MD; 11/30/2016 1:54 PM) GASTRIC POLYP (K31.7) Impression: Large, 4 cm gastric polyp in the distal antrum at the site of previous gastric ulcer. Multiple biopsies show hyperplasia without atypia. Currently no evidence of bleeding or symptoms. Ultrasound shows it in the deep submucosa layer but not into the deeper layers of the stomach and no adenopathy. Endoscopic removal attempted to do but apparently it was felt to be too large for endoscopic removal.  I again had a long discussion again with the patient and her husband. We discussed that the final recommendation from [facility] was endoscopic surveillance. There certainly would be some risk of obstruction, bleeding, or malignant degeneration of this is left in place. However it is difficult to quantify that risk and with surveillance hopefully these issues could be picked up early and go to surgery at that time. We also discussed surgical intervention. I discussed laparoscopic distal gastrectomy with Roux-en-Y reconstruction. I discussed the procedure in detail including significant risks of anesthetic complications, bleeding, infection, anastomotic leak and GI side effects. This likely would result in some significant weight loss which would be of benefit. I made it clear to the patient and her husband that surgery is  not mandatory  if they would like to avoid it with endoscopic surveillance. However she continues to feel very strongly that she wants this removed. She feels she's going to be unable to really cope with the anxiety and risk of bleeding or obstruction or malignant degeneration. I think this is reasonable. She and her husband understand the alternatives and risks involved with these were approach. We'll plan to proceed with surgical resection. Current Plans Laparoscopic distal gastrectomy with Roux-en-Y reconstruction

## 2016-12-18 NOTE — Anesthesia Postprocedure Evaluation (Signed)
Anesthesia Post Note  Patient: Barbara Spencer  Procedure(s) Performed: Procedure(s) (LRB): LAPAROSCOPIC PARTIAL GASTRECTOMY WITH ROUX-EN-Y RECONSTRUCTION (N/A)     Patient location during evaluation: PACU Anesthesia Type: General Level of consciousness: awake and alert Pain management: pain level controlled Vital Signs Assessment: post-procedure vital signs reviewed and stable Respiratory status: spontaneous breathing, nonlabored ventilation and respiratory function stable Cardiovascular status: blood pressure returned to baseline and stable Postop Assessment: no apparent nausea or vomiting Anesthetic complications: no    Last Vitals:  Vitals:   12/18/16 1615 12/18/16 1630  BP: (!) 175/87 (!) 156/95  Pulse: 84 88  Resp: 11 14  Temp:  (!) 36.4 C  SpO2: 96% 97%    Last Pain:  Vitals:   12/18/16 1630  TempSrc:   PainSc: [place]

## 2016-12-18 NOTE — Anesthesia Preprocedure Evaluation (Addendum)
Anesthesia Evaluation  Patient identified by MRN, date of birth, ID band Patient awake    Reviewed: Allergy & Precautions, NPO status , Patient's Chart, lab work & pertinent test results, reviewed documented beta blocker date and time   History of Anesthesia Complications Negative for: history of anesthetic complications  Airway Mallampati: II  TM Distance: >3 FB Neck ROM: Full    Dental  (+) Poor Dentition, Missing, Dental Advisory Given   Pulmonary sleep apnea and Continuous Positive Airway Pressure Ventilation , former smoker,    breath sounds clear to auscultation       Cardiovascular hypertension, Pt. on medications and Pt. on home beta blockers (-) angina(-) Past MI + Valvular Problems/Murmurs AS  Rhythm:Regular Rate:Normal + Systolic murmurs    Neuro/Psych  Headaches, PSYCHIATRIC DISORDERS Anxiety Depression Bipolar Disorder Seasonal affective d/oCVA, No Residual Symptoms    GI/Hepatic Neg liver ROS, PUD,   Endo/Other  diabetes, Poorly Controlled, Type 2, Insulin DependentHypothyroidism Morbid obesity  Renal/GU negative Renal ROS     Musculoskeletal  (+) Arthritis , Osteoarthritis,    Abdominal (+) + obese,   Peds  Hematology  (+) anemia ,   Anesthesia Other Findings   Reproductive/Obstetrics                            Anesthesia Physical  Anesthesia Plan  ASA: III  Anesthesia Plan: General   Post-op Pain Management:    Induction: Intravenous  PONV Risk Score and Plan: 4 or greater and Ondansetron, Dexamethasone, Midazolam and Treatment may vary due to age or medical condition  Airway Management Planned: Oral ETT  Additional Equipment: None  Intra-op Plan:   Post-operative Plan: Extubation in OR  Informed Consent: I have reviewed the patients History and Physical, chart, labs and discussed the procedure including the risks, benefits and alternatives for the proposed  anesthesia with the patient or authorized representative who has indicated his/her understanding and acceptance.   Dental advisory given  Plan Discussed with: CRNA  Anesthesia Plan Comments:        Anesthesia Quick Evaluation

## 2016-12-18 NOTE — Interval H&P Note (Signed)
History and Physical Interval Note:  12/18/2016 9:13 AM  Barbara Spencer  has presented today for surgery, with the diagnosis of Gastric Polyp, DM II, HTN, Morbid Obesity  The various methods of treatment have been discussed with the patient and family. After consideration of risks, benefits and other options for treatment, the patient has consented to  Procedure(s): LAPAROSCOPIC PARTIAL GASTRECTOMY (PULL LIKE GASTRIC BYPASS) (N/A) as a surgical intervention .  The patient's history has been reviewed, patient examined, no change in status, stable for surgery.  I have reviewed the patient's chart and labs.  Questions were answered to the patient's satisfaction.     [name]

## 2016-12-18 NOTE — Transfer of Care (Signed)
Immediate Anesthesia Transfer of Care Note  Patient: Barbara Spencer  Procedure(s) Performed: Procedure(s): LAPAROSCOPIC PARTIAL GASTRECTOMY WITH ROUX-EN-Y RECONSTRUCTION (N/A)  Patient Location: PACU  Anesthesia Type:General  Level of Consciousness: awake, alert , oriented and patient cooperative  Airway & Oxygen Therapy: Patient Spontanous Breathing and Patient connected to face mask oxygen  Post-op Assessment: Report given to RN, Post -op Vital signs reviewed and stable and Patient moving all extremities  Post vital signs: Reviewed and stable  Last Vitals:  Vitals:   12/18/16 0808  BP: (!) 159/117  Pulse: 71  Resp: 16  Temp: 37.1 C  SpO2: 98%    Last Pain:  Vitals:   12/18/16 0808  TempSrc: Oral      Patients Stated Pain Goal: 4 (91/63/84 [id number])  Complications: No apparent anesthesia complications

## 2016-12-18 NOTE — Progress Notes (Signed)
Pt will self administer when ready for bed.  RT to monitor and assess as needed.

## 2016-12-19 ENCOUNTER — Encounter (HOSPITAL_COMMUNITY): Payer: Self-pay | Admitting: General Surgery

## 2016-12-19 LAB — CBC WITH DIFFERENTIAL/PLATELET
BASOS ABS: 0 10*3/uL (ref 0.0–0.1)
Basophils Relative: 0 %
EOS ABS: 0 10*3/uL (ref 0.0–0.7)
EOS PCT: 0 %
HCT: 37.5 % (ref 36.0–46.0)
HEMOGLOBIN: 12.2 g/dL (ref 12.0–15.0)
Lymphocytes Relative: 10 %
Lymphs Abs: 1.1 10*3/uL (ref 0.7–4.0)
MCH: 24.6 pg — ABNORMAL LOW (ref 26.0–34.0)
MCHC: 32.5 g/dL (ref 30.0–36.0)
MCV: 75.8 fL — ABNORMAL LOW (ref 78.0–100.0)
Monocytes Absolute: 1.1 10*3/uL — ABNORMAL HIGH (ref 0.1–1.0)
Monocytes Relative: 9 %
NEUTROS PCT: 81 %
Neutro Abs: 9.5 10*3/uL — ABNORMAL HIGH (ref 1.7–7.7)
PLATELETS: 211 10*3/uL (ref 150–400)
RBC: 4.95 MIL/uL (ref 3.87–5.11)
RDW: 15.8 % — ABNORMAL HIGH (ref 11.5–15.5)
WBC: 11.7 10*3/uL — AB (ref 4.0–10.5)

## 2016-12-19 LAB — GLUCOSE, CAPILLARY
GLUCOSE-CAPILLARY: 192 mg/dL — AB (ref 65–99)
GLUCOSE-CAPILLARY: 189 mg/dL — AB (ref 65–99)
Glucose-Capillary: 185 mg/dL — ABNORMAL HIGH (ref 65–99)
Glucose-Capillary: 189 mg/dL — ABNORMAL HIGH (ref 65–99)
Glucose-Capillary: 200 mg/dL — ABNORMAL HIGH (ref 65–99)
Glucose-Capillary: 202 mg/dL — ABNORMAL HIGH (ref 65–99)
Glucose-Capillary: 228 mg/dL — ABNORMAL HIGH (ref 65–99)

## 2016-12-19 LAB — COMPREHENSIVE METABOLIC PANEL
ALBUMIN: 3.4 g/dL — AB (ref 3.5–5.0)
ALK PHOS: 49 U/L (ref 38–126)
ALT: 510 U/L — AB (ref 14–54)
AST: 485 U/L — ABNORMAL HIGH (ref 15–41)
Anion gap: 10 (ref 5–15)
BILIRUBIN TOTAL: 0.7 mg/dL (ref 0.3–1.2)
BUN: 20 mg/dL (ref 6–20)
CALCIUM: 9.2 mg/dL (ref 8.9–10.3)
CO2: 27 mmol/L (ref 22–32)
CREATININE: 1.04 mg/dL — AB (ref 0.44–1.00)
Chloride: 100 mmol/L — ABNORMAL LOW (ref 101–111)
GFR calc non Af Amer: 57 mL/min — ABNORMAL LOW (ref >60)
GLUCOSE: 199 mg/dL — AB (ref 65–99)
Potassium: 4.5 mmol/L (ref 3.5–5.1)
SODIUM: 137 mmol/L (ref 135–145)
TOTAL PROTEIN: 6.5 g/dL (ref 6.5–8.1)

## 2016-12-19 MED ORDER — INSULIN GLARGINE 100 UNIT/ML ~~LOC~~ SOLN
15.0000 [IU] | Freq: Every day | SUBCUTANEOUS | Status: DC
  Administered 2016-12-19: 15 [IU] via SUBCUTANEOUS
  Filled 2016-12-19: qty 0.15

## 2016-12-19 NOTE — Progress Notes (Signed)
Nutrition Education Note  Received consult for diet education s/p gastrectomy  Spoke with MD who states he would like her to follow liquid diet for at least one week. MD would also like continued education and follow-up on patient, recommend out-patient RD consult.   Pt reports no current N/V but abdominal pain. Pt taking clear liquids at time of visit. Answered pt and husband's questions.   Discussed 2 week post op diet with pt. Emphasized that liquids must be non carbonated, non caffeinated, and sugar free. Fluid goals discussed. Pt to follow up with outpatient bariatric RD for further diet progression after 2 weeks. Multivitamins and minerals also reviewed. Teach back method used, pt expressed understanding, expect good compliance.  Diet: First 2 Weeks  You will see the nutritionist about two (2) weeks after your surgery. The nutritionist will increase the types of foods you can eat if you are handling liquids well:  If you have severe vomiting or nausea and cannot handle clear liquids lasting longer than 1 day, call your surgeon  Protein Shake  Drink at least 2 ounces of shake 5-6 times per day  Each serving of protein shakes (usually 8 - 12 ounces) should have a minimum of:  15 grams of protein  And no more than 5 grams of carbohydrate  Goal for protein each day:  Men = 80 grams per day  Women = 60 grams per day  Protein powder may be added to fluids such as non-fat milk or Lactaid milk or Soy milk (limit to 35 grams added protein powder per serving)   Hydration  Slowly increase the amount of water and other clear liquids as tolerated (See Acceptable Fluids)  Slowly increase the amount of protein shake as tolerated  Sip fluids slowly and throughout the day  May use sugar substitutes in small amounts (no more than 6 - 8 packets per day; i.e. Splenda)   Fluid Goal  The first goal is to drink at least 8 ounces of protein shake/drink per day (or as directed by the nutritionist); some  examples of protein shakes are Premier Protein, [employer], [employer], EAS Edge HP, and Unjury. See handout from pre-op Bariatric Education Class:  Slowly increase the amount of protein shake you drink as tolerated  You may find it easier to slowly sip shakes throughout the day  It is important to get your proteins in first  Your fluid goal is to drink 64 - 100 ounces of fluid daily  It may take a few weeks to build up to this  32 oz (or more) should be clear liquids  And  32 oz (or more) should be full liquids (see below for examples)  Liquids should not contain sugar, caffeine, or carbonation   Clear Liquids:  Water or Sugar-free flavored water (i.e. Fruit H2O, Propel)  Decaffeinated coffee or tea (sugar-free)  Crystal Lite, Wyler's Lite, Minute Maid Lite  Sugar-free Jell-O  Bouillon or broth  Sugar-free Popsicle: *Less than 20 calories each; Limit 1 per day   Full Liquids:  Protein Shakes/Drinks + 2 choices per day of other full liquids  Full liquids must be:  No More Than 12 grams of Carbs per serving  No More Than 3 grams of Fat per serving  Strained low-fat cream soup  Non-Fat milk  Fat-free Lactaid Milk  Sugar-free yogurt (Dannon Lite & Fit, [place] yogurt, Oikos Zero)   [place], [place], [place], [place] 12/19/2016 1:57 PM

## 2016-12-19 NOTE — Progress Notes (Signed)
Introduced self to patient.  Offered assistance with staff regarding current diet.  Patient nor family had questions at this time.

## 2016-12-19 NOTE — Progress Notes (Signed)
1 Day Post-Op   Subjective: No C/O.  Denies pain or nausea  Objective: Vital signs in last 24 hours: Temp:  [97.5 F (36.4 C)-98.8 F (37.1 C)] 98.6 F (37 C) (09/26 0430) Pulse Rate:  [71-88] 86 (09/26 0430) Resp:  [11-20] 18 (09/26 0430) BP: (145-177)/(75-117) 154/75 (09/26 0430) SpO2:  [93 %-99 %] 93 % (09/26 0430) Weight:  [139.3 kg (307 lb)-139.4 kg (307 lb 4 oz)] 139.3 kg (307 lb) (09/25 0919) Last BM Date: 12/17/16  Intake/Output from previous day: 09/25 0701 - 09/26 0700 In: 3800 [I.V.:3800] Out: 1515 [Urine:1150; Drains:265; Blood:100] Intake/Output this shift: No intake/output data recorded.  General appearance: alert, cooperative, no distress and morbidly obese GI: normal findings: soft, non-tender Incision/Wound: Dressings clean and dry, JP serosanguinous  Lab Results:   Recent Labs  12/18/16 1902 12/19/16 0429  WBC  --  11.7*  HGB 11.9* 12.2  HCT 36.8 37.5  PLT  --  211   BMET  Recent Labs  12/19/16 0429  NA 137  K 4.5  CL 100*  CO2 27  GLUCOSE 199*  BUN 20  CREATININE 1.04*  CALCIUM 9.2   CBG (last 3)   Recent Labs  12/18/16 2146 12/19/16 0010 12/19/16 0426  GLUCAP 277* 228* 189*       Studies/Results: No results found.  Anti-infectives: Anti-infectives    Start     Dose/Rate Route Frequency Ordered Stop   12/18/16 0757  cefoTEtan in Dextrose 5% (CEFOTAN) IVPB 2 g     2 g Intravenous On call to O.R. 12/18/16 0757 12/18/16 1050      Assessment/Plan: s/p Procedure(s): LAPAROSCOPIC PARTIAL GASTRECTOMY WITH ROUX-EN-Y RECONSTRUCTION Doing well without apparent complication.  Start CL diet. AODM.  On SSI.  Increase lantus   LOS: 1 day    Taquisha Phung T 9/26/2018Patient ID: Eleshia GLAYDS [employer], female   DOB: 11-Jun-1955, 61 y.o.   MRN: [id number]

## 2016-12-20 LAB — CBC WITH DIFFERENTIAL/PLATELET
BASOS PCT: 0 %
Basophils Absolute: 0 10*3/uL (ref 0.0–0.1)
EOS ABS: 0 10*3/uL (ref 0.0–0.7)
Eosinophils Relative: 0 %
HCT: 36.6 % (ref 36.0–46.0)
HEMOGLOBIN: 11.8 g/dL — AB (ref 12.0–15.0)
Lymphocytes Relative: 16 %
Lymphs Abs: 2.3 10*3/uL (ref 0.7–4.0)
MCH: 24.4 pg — ABNORMAL LOW (ref 26.0–34.0)
MCHC: 32.2 g/dL (ref 30.0–36.0)
MCV: 75.8 fL — ABNORMAL LOW (ref 78.0–100.0)
MONO ABS: 0.7 10*3/uL (ref 0.1–1.0)
MONOS PCT: 5 %
NEUTROS PCT: 79 %
Neutro Abs: 11.3 10*3/uL — ABNORMAL HIGH (ref 1.7–7.7)
PLATELETS: 183 10*3/uL (ref 150–400)
RBC: 4.83 MIL/uL (ref 3.87–5.11)
RDW: 16.2 % — AB (ref 11.5–15.5)
WBC: 14.4 10*3/uL — ABNORMAL HIGH (ref 4.0–10.5)

## 2016-12-20 LAB — GLUCOSE, CAPILLARY
GLUCOSE-CAPILLARY: 191 mg/dL — AB (ref 65–99)
GLUCOSE-CAPILLARY: 199 mg/dL — AB (ref 65–99)
GLUCOSE-CAPILLARY: 199 mg/dL — AB (ref 65–99)
GLUCOSE-CAPILLARY: 274 mg/dL — AB (ref 65–99)
Glucose-Capillary: 248 mg/dL — ABNORMAL HIGH (ref 65–99)
Glucose-Capillary: 184 mg/dL — ABNORMAL HIGH (ref 65–99)
Glucose-Capillary: 218 mg/dL — ABNORMAL HIGH (ref 65–99)

## 2016-12-20 MED ORDER — INSULIN GLARGINE 100 UNIT/ML ~~LOC~~ SOLN
20.0000 [IU] | Freq: Every day | SUBCUTANEOUS | Status: DC
  Administered 2016-12-20: 20 [IU] via SUBCUTANEOUS
  Filled 2016-12-20: qty 0.2

## 2016-12-20 NOTE — Progress Notes (Signed)
Patient alert and oriented, Post op day 2.  Provided support and encouragement.  Encouraged pulmonary toilet, ambulation and small sips of liquids. Patient has begun protein shakes this am. Will educate family and patient this afternoon on post operative diet.

## 2016-12-20 NOTE — Progress Notes (Signed)
Patient alert and oriented, pain is controlled. Patient is tolerating fluids, advanced to protein shake today, patient is tolerating well. Reviewed Gastric Bypass discharge instructions with patient and patient is able to articulate understanding. Information provided about Support Group and [facility] outpatient pharmacy. All questions answered, will continue to monitor.

## 2016-12-20 NOTE — Progress Notes (Signed)
Patient ID: Barbara Spencer, female   DOB: 07-06-1955, 61 y.o.   MRN: [id number] 2 Days Post-Op   Subjective: No C/O x got high CHO shake this AM and BS went up. Denies pain or nausea, has been walking in halls  Objective: Vital signs in last 24 hours: Temp:  [98.2 F (36.8 C)-98.6 F (37 C)] 98.5 F (36.9 C) (09/27 7425) Pulse Rate:  [80-106] 89 (09/27 0632) Resp:  [18-24] 20 (09/27 9563) BP: (113-141)/(67-92) 113/76 (09/27 8756) SpO2:  [93 %-95 %] 93 % (09/27 4332) Weight:  [143.3 kg (315 lb 14.7 oz)] 143.3 kg (315 lb 14.7 oz) (09/27 9518) Last BM Date: 12/17/16  Intake/Output from previous day: 09/26 0701 - 09/27 0700 In: 2000 [I.V.:2000] Out: 865 [Urine:800; Drains:65] Intake/Output this shift: No intake/output data recorded.  General appearance: alert, cooperative, no distress and morbidly obese GI: normal findings: soft, non-tender Incision/Wound: Dressings clean and dry. JP serosang  Lab Results:   Recent Labs  12/18/16 1902 12/19/16 0429  WBC  --  11.7*  HGB 11.9* 12.2  HCT 36.8 37.5  PLT  --  211   BMET  Recent Labs  12/19/16 0429  NA 137  K 4.5  CL 100*  CO2 27  GLUCOSE 199*  BUN 20  CREATININE 1.04*  CALCIUM 9.2   CBG (last 3)   Recent Labs  12/19/16 2121 12/20/16 0111 12/20/16 0451  GLUCAP 189* 274* 248*    Lab this AM pending  Studies/Results: No results found.  Anti-infectives: Anti-infectives    Start     Dose/Rate Route Frequency Ordered Stop   12/18/16 0757  cefoTEtan in Dextrose 5% (CEFOTAN) IVPB 2 g     2 g Intravenous On call to O.R. 12/18/16 0757 12/18/16 1050      Assessment/Plan: s/p Procedure(s): LAPAROSCOPIC PARTIAL GASTRECTOMY WITH ROUX-EN-Y RECONSTRUCTION Doing well without apparent complication.  Plan discharge tomorrow AODM-dietician has seen.  Low CHO bariatric shakes only.  Will increase lantus   LOS: 2 days    Yeison Sippel T 12/20/2016

## 2016-12-20 NOTE — Progress Notes (Signed)
RT placed patient on CPAP. Patient setting is auto 5-15 cmH2O. Sterile water placed in water chamber for humidification. 2 liters bleed into tubing . Patient is tolerating well.

## 2016-12-20 NOTE — Progress Notes (Signed)
Appointment made for patient at [place] with Emelda Fear RD for diet progression post surgery.  Patient aware, information for follow up on After Visit Summary.

## 2016-12-21 LAB — CBC
HEMATOCRIT: 36.3 % (ref 36.0–46.0)
Hemoglobin: 11.6 g/dL — ABNORMAL LOW (ref 12.0–15.0)
MCH: 24.3 pg — AB (ref 26.0–34.0)
MCHC: 32 g/dL (ref 30.0–36.0)
MCV: 76.1 fL — AB (ref 78.0–100.0)
Platelets: 188 10*3/uL (ref 150–400)
RBC: 4.77 MIL/uL (ref 3.87–5.11)
RDW: 16.1 % — AB (ref 11.5–15.5)
WBC: 11.9 10*3/uL — ABNORMAL HIGH (ref 4.0–10.5)

## 2016-12-21 LAB — BASIC METABOLIC PANEL
Anion gap: 9 (ref 5–15)
BUN: 14 mg/dL (ref 6–20)
CALCIUM: 9.1 mg/dL (ref 8.9–10.3)
CO2: 26 mmol/L (ref 22–32)
CREATININE: 1.05 mg/dL — AB (ref 0.44–1.00)
Chloride: 100 mmol/L — ABNORMAL LOW (ref 101–111)
GFR calc non Af Amer: 56 mL/min — ABNORMAL LOW (ref >60)
GLUCOSE: 228 mg/dL — AB (ref 65–99)
Potassium: 4.1 mmol/L (ref 3.5–5.1)
Sodium: 135 mmol/L (ref 135–145)

## 2016-12-21 LAB — GLUCOSE, CAPILLARY
Glucose-Capillary: 208 mg/dL — ABNORMAL HIGH (ref 65–99)
Glucose-Capillary: 250 mg/dL — ABNORMAL HIGH (ref 65–99)

## 2016-12-21 MED ORDER — INSULIN DETEMIR 100 UNIT/ML FLEXPEN
24.0000 [IU] | PEN_INJECTOR | Freq: Two times a day (BID) | SUBCUTANEOUS | 11 refills | Status: DC
Start: 2016-12-21 — End: 2018-07-31

## 2016-12-21 MED ORDER — OXYCODONE HCL 5 MG/5ML PO SOLN: 5.0000 mg | Freq: Four times a day (QID) | ORAL | 0 refills | Status: DC | PRN

## 2016-12-21 NOTE — Progress Notes (Signed)
Synthroid medication was held at 8 AM due to the patient having breakfast. Patient was discharged before administration was able to be given.

## 2016-12-21 NOTE — Progress Notes (Signed)
Patient ID: Barbara Spencer, female   DOB: 24-Mar-1956, 61 y.o.   MRN: [id number] 3 Days Post-Op   Subjective: No complaints. No significant pain. Tolerating protein shakes without nausea. Having flatus. No bowel movement yet.  Objective: Vital signs in last 24 hours: Temp:  [98 F (36.7 C)-98.7 F (37.1 C)] 98.7 F (37.1 C) (09/28 0538) Pulse Rate:  [81-100] 96 (09/28 0538) Resp:  [16-20] 20 (09/28 0538) BP: (125-134)/(70-88) 134/88 (09/28 0538) SpO2:  [94 %-98 %] 95 % (09/28 0538) Weight:  [135.4 kg (298 lb 8 oz)] 135.4 kg (298 lb 8 oz) (09/28 0544) Last BM Date: 12/17/16  Intake/Output from previous day: 09/27 0701 - 09/28 0700 In: 3795.4 [P.O.:1540; I.V.:2255.4] Out: 555 [Urine:500; Drains:55] Intake/Output this shift: No intake/output data recorded.  General appearance: alert, cooperative, no distress and morbidly obese GI: normal findings: soft, non-tender Incision/Wound: No erythema or drainage. JP serosanguineous.  Lab Results:   Recent Labs  12/20/16 0630 12/21/16 0523  WBC 14.4* 11.9*  HGB 11.8* 11.6*  HCT 36.6 36.3  PLT 183 188   BMET  Recent Labs  12/19/16 0429 12/21/16 0523  NA 137 135  K 4.5 4.1  CL 100* 100*  CO2 27 26  GLUCOSE 199* 228*  BUN 20 14  CREATININE 1.04* 1.05*  CALCIUM 9.2 9.1   CBG (last 3)   Recent Labs  12/20/16 2335 12/21/16 0338 12/21/16 0814  GLUCAP 199* 208* 250*    Pathology confirmed benign hyperplastic polyp, 5.8 cm with erosion and granulation tissue, margins negative  Studies/Results: No results found.  Anti-infectives: Anti-infectives    Start     Dose/Rate Route Frequency Ordered Stop   12/18/16 0757  cefoTEtan in Dextrose 5% (CEFOTAN) IVPB 2 g     2 g Intravenous On call to O.R. 12/18/16 0757 12/18/16 1050      Assessment/Plan: s/p Procedure(s): LAPAROSCOPIC PARTIAL GASTRECTOMY WITH ROUX-EN-Y RECONSTRUCTION Doing well postoperatively without apparent complication. Okay for discharge.    LOS:  3 days    Janesia Joswick T 12/21/2016

## 2016-12-21 NOTE — Discharge Summary (Signed)
Patient ID: Barbara CREMEANS [id number] 61 y.o. 31-Jul-1955  12/18/2016  Discharge date and time: 12/21/2016   Admitting Physician: Excell Seltzer T  Discharge Physician: Excell Seltzer T  Admission Diagnoses: Gastric polyp [K31.7] morbid obesity  Discharge Diagnoses: Same  Operations: Procedure(s): LAPAROSCOPIC PARTIAL GASTRECTOMY WITH ROUX-EN-Y RECONSTRUCTION  Admission Condition: fair  Discharged Condition: fair  Indication for Admission: Patient is a 61 year old female with multiple comorbidities including extreme morbid obesity and poorly controlled insulin-dependent diabetes mellitus. She has a history of gastric ulcer with bleeding and on follow-up endoscopy has had a very large nearly circumferential 6 cm hyperplastic polyp in the distal antrum with partial obstruction. After extensive preoperative discussion and workup as well as initial referral to [facility] for endoscopic removal which was aborted we have elected to proceed with distal gastrectomy laparoscopically.  Hospital Course: On the morning of admission the patient underwent a laparoscopic distal gastrectomy with Roux-en-Y reconstruction. Her postoperative course was uncomplicated. She was started on clear liquids on the first postoperative day and tolerated this well. She was advanced to protein shakes prior to discharge and tolerated this well. She had mild expected pain. No evidence of infection or other complications. Final pathology showed a 5.8 cm benign hyperplastic polyp with granulation tissue and erosion and margins negative. Patient was seen by our bariatric dietitian and bariatric team during her hospitalization as well as with her partial gastrectomy and Roux-en-Y reconstruction we will try to utilize this anatomy to promote weight loss and improvement in her diabetes utilizing our postoperative bariatric protocol.  Consults: Dietary   Disposition: Home  Patient Instructions:  Allergies as of  12/21/2016      Reactions   Latex Hives   Sulfa Antibiotics Itching, Swelling   Tape Rash, Other (See Comments)   Paper tape ok to use per pt-Tegaderm reddens skin and Electrodes "ripped skin off and now have scars"      Medication List    TAKE these medications   buPROPion 300 MG 24 hr tablet Commonly known as:  WELLBUTRIN XL Take 1 tablet (300 mg total) by mouth every morning.   FISH OIL PO Take 4 capsules by mouth daily.   gabapentin 300 MG capsule Commonly known as:  NEURONTIN Take 300 mg by mouth at bedtime.   HYDROcodone-acetaminophen 5-325 MG tablet Commonly known as:  NORCO/VICODIN Take 1 tablet by mouth every 12 (twelve) hours as needed for moderate pain.   Insulin Detemir 100 UNIT/ML Pen Commonly known as:  LEVEMIR Inject 24 Units into the skin 2 (two) times daily. What changed:  how much to take   JANUVIA 100 MG tablet Generic drug:  sitaGLIPtin Take 100 mg by mouth daily.   levothyroxine 50 MCG tablet Commonly known as:  SYNTHROID, LEVOTHROID Take 50 mcg by mouth daily before breakfast.   losartan-hydrochlorothiazide 50-12.5 MG tablet Commonly known as:  HYZAAR TAKE ONE TABLET BY MOUTH ONCE A DAY.   metFORMIN 1000 MG tablet Commonly known as:  GLUCOPHAGE TAKE 1000 MG BY MOUTH TWICE A DAY WITH MEALS.   metoprolol tartrate 50 MG tablet Commonly known as:  LOPRESSOR Take 50 mg by mouth 2 (two) times daily.   omeprazole 20 MG tablet Commonly known as:  PRILOSEC OTC Take 20 mg by mouth 2 (two) times daily.   oxyCODONE 5 MG/5ML solution Commonly known as:  ROXICODONE Take 5 mLs (5 mg total) by mouth every 6 (six) hours as needed for moderate pain or severe pain.   rosuvastatin 40 MG tablet Commonly known as:  CRESTOR Take 40 mg by mouth at bedtime.   Venlafaxine HCl 225 MG Tb24 Take 1 tablet (225 mg total) by mouth daily.   VICTOZA 18 MG/3ML Sopn Generic drug:  liraglutide Inject 1.2 mg into the skin daily.   Vitamin D3 1000 units Caps Take  2,000 Units by mouth daily.            Discharge Care Instructions        Start     Ordered   12/21/16 0000  Insulin Detemir (LEVEMIR) 100 UNIT/ML Pen  2 times daily     12/21/16 0945   12/21/16 0000  oxyCODONE (ROXICODONE) 5 MG/5ML solution  Every 6 hours PRN     12/21/16 0945      Activity: activity as tolerated Diet: Low carbohydrate protein shakes Wound Care: Empty JP drain daily and record amount  Follow-up:  With Dr. Excell Seltzer in 1 week.  Signed: Edward Jolly MD, FACS  12/21/2016, 9:47 AM

## 2016-12-21 NOTE — Discharge Instructions (Signed)

## 2016-12-21 NOTE — Progress Notes (Signed)
Discharge instructions reviewed with patient and husband. All questions were answered. Patient wheeled to vehicle with belongings by nurse tech.

## 2016-12-24 DIAGNOSIS — I350 Nonrheumatic aortic (valve) stenosis: Secondary | ICD-10-CM | POA: Diagnosis not present

## 2016-12-24 DIAGNOSIS — I10 Essential (primary) hypertension: Secondary | ICD-10-CM | POA: Diagnosis not present

## 2016-12-24 DIAGNOSIS — R0602 Shortness of breath: Secondary | ICD-10-CM | POA: Diagnosis not present

## 2016-12-24 DIAGNOSIS — E782 Mixed hyperlipidemia: Secondary | ICD-10-CM | POA: Diagnosis not present

## 2016-12-24 DIAGNOSIS — G4730 Sleep apnea, unspecified: Secondary | ICD-10-CM | POA: Diagnosis not present

## 2016-12-25 ENCOUNTER — Encounter (HOSPITAL_COMMUNITY): Payer: Self-pay | Admitting: General Surgery

## 2016-12-28 DIAGNOSIS — I350 Nonrheumatic aortic (valve) stenosis: Secondary | ICD-10-CM | POA: Diagnosis not present

## 2016-12-28 DIAGNOSIS — E782 Mixed hyperlipidemia: Secondary | ICD-10-CM | POA: Diagnosis not present

## 2016-12-28 DIAGNOSIS — I10 Essential (primary) hypertension: Secondary | ICD-10-CM | POA: Diagnosis not present

## 2016-12-28 DIAGNOSIS — I4891 Unspecified atrial fibrillation: Secondary | ICD-10-CM | POA: Diagnosis not present

## 2016-12-28 DIAGNOSIS — R0602 Shortness of breath: Secondary | ICD-10-CM | POA: Diagnosis not present

## 2017-01-02 ENCOUNTER — Encounter: Payer: Self-pay | Admitting: Skilled Nursing Facility1

## 2017-01-02 ENCOUNTER — Encounter: Payer: PPO | Attending: General Surgery | Admitting: Skilled Nursing Facility1

## 2017-01-02 DIAGNOSIS — Z713 Dietary counseling and surveillance: Secondary | ICD-10-CM | POA: Diagnosis not present

## 2017-01-02 DIAGNOSIS — E119 Type 2 diabetes mellitus without complications: Secondary | ICD-10-CM | POA: Insufficient documentation

## 2017-01-02 DIAGNOSIS — Z6841 Body Mass Index (BMI) 40.0 and over, adult: Secondary | ICD-10-CM

## 2017-01-02 DIAGNOSIS — E639 Nutritional deficiency, unspecified: Secondary | ICD-10-CM | POA: Insufficient documentation

## 2017-01-02 NOTE — Progress Notes (Signed)
Bariatric Class:  Appt start time: 1530 end time:  1630.  2 Week Post-Operative Nutrition Class  Patient was seen on 01/02/2017 for Post-[facility]ative Nutrition [facility]   Pt has had the RYGB due to a polyp in her gut. Pt had many questions about foods and eating out. Pt arrives with her supportive husband. Pt states she has been checking her blood sugars: 88-130. Pt state she is taking insulin, vicotza, and metformin but does have an appointment with her doctor the 19th.    Surgery date: 12/18/2016 Surgery type: RYGB Start weight at [facility]: 314 Weight today: [place] RESULTS  01/02/2017   BMI (kg/m^2) 53.5   Fat Mass (lbs) 168.6   Fat Free Mass (lbs) 133.4   Total Body Water (lbs) 99.2   The following the learning objectives were met by the patient during this course:  Identifies Phase 3A (Soft, High Proteins) Dietary Goals and will begin from 2 weeks post-operatively to 2 months post-operatively  Identifies appropriate sources of fluids and proteins   States protein recommendations and appropriate sources post-operatively  Identifies the need for appropriate texture modifications, mastication, and bite sizes when consuming solids  Identifies appropriate multivitamin and calcium sources post-operatively  Describes the need for physical activity post-operatively and will follow MD recommendations  States when to call healthcare provider regarding medication questions or post-operative complications  Handouts given during class include:  Phase 3A: Soft, High Protein Diet Handout  Follow-Up Plan: Patient will follow-up at [facility] in 6 weeks for 2 month post-op nutrition visit for diet advancement per MD.

## 2017-01-07 DIAGNOSIS — M5441 Lumbago with sciatica, right side: Secondary | ICD-10-CM | POA: Diagnosis not present

## 2017-01-07 DIAGNOSIS — M545 Low back pain: Secondary | ICD-10-CM | POA: Diagnosis not present

## 2017-01-07 DIAGNOSIS — F419 Anxiety disorder, unspecified: Secondary | ICD-10-CM | POA: Diagnosis not present

## 2017-01-07 DIAGNOSIS — E669 Obesity, unspecified: Secondary | ICD-10-CM | POA: Diagnosis not present

## 2017-01-07 DIAGNOSIS — G894 Chronic pain syndrome: Secondary | ICD-10-CM | POA: Diagnosis not present

## 2017-01-07 DIAGNOSIS — F1120 Opioid dependence, uncomplicated: Secondary | ICD-10-CM | POA: Diagnosis not present

## 2017-01-07 DIAGNOSIS — F319 Bipolar disorder, unspecified: Secondary | ICD-10-CM | POA: Diagnosis not present

## 2017-01-11 DIAGNOSIS — I340 Nonrheumatic mitral (valve) insufficiency: Secondary | ICD-10-CM | POA: Diagnosis not present

## 2017-01-11 DIAGNOSIS — Z23 Encounter for immunization: Secondary | ICD-10-CM | POA: Diagnosis not present

## 2017-01-11 DIAGNOSIS — E119 Type 2 diabetes mellitus without complications: Secondary | ICD-10-CM | POA: Diagnosis not present

## 2017-01-11 DIAGNOSIS — E782 Mixed hyperlipidemia: Secondary | ICD-10-CM | POA: Diagnosis not present

## 2017-01-11 DIAGNOSIS — I4891 Unspecified atrial fibrillation: Secondary | ICD-10-CM | POA: Diagnosis not present

## 2017-01-11 DIAGNOSIS — G4730 Sleep apnea, unspecified: Secondary | ICD-10-CM | POA: Diagnosis not present

## 2017-01-11 DIAGNOSIS — I350 Nonrheumatic aortic (valve) stenosis: Secondary | ICD-10-CM | POA: Diagnosis not present

## 2017-01-11 DIAGNOSIS — I10 Essential (primary) hypertension: Secondary | ICD-10-CM | POA: Diagnosis not present

## 2017-01-11 DIAGNOSIS — R0602 Shortness of breath: Secondary | ICD-10-CM | POA: Diagnosis not present

## 2017-01-17 DIAGNOSIS — F1120 Opioid dependence, uncomplicated: Secondary | ICD-10-CM | POA: Diagnosis not present

## 2017-01-17 DIAGNOSIS — Z79899 Other long term (current) drug therapy: Secondary | ICD-10-CM | POA: Diagnosis not present

## 2017-01-28 DIAGNOSIS — R5383 Other fatigue: Secondary | ICD-10-CM | POA: Diagnosis not present

## 2017-01-28 DIAGNOSIS — E1121 Type 2 diabetes mellitus with diabetic nephropathy: Secondary | ICD-10-CM | POA: Diagnosis not present

## 2017-01-28 DIAGNOSIS — E559 Vitamin D deficiency, unspecified: Secondary | ICD-10-CM | POA: Diagnosis not present

## 2017-01-28 DIAGNOSIS — I10 Essential (primary) hypertension: Secondary | ICD-10-CM | POA: Diagnosis not present

## 2017-01-28 DIAGNOSIS — E782 Mixed hyperlipidemia: Secondary | ICD-10-CM | POA: Diagnosis not present

## 2017-01-28 DIAGNOSIS — E039 Hypothyroidism, unspecified: Secondary | ICD-10-CM | POA: Diagnosis not present

## 2017-02-12 ENCOUNTER — Encounter: Payer: Self-pay | Admitting: Skilled Nursing Facility1

## 2017-02-12 ENCOUNTER — Encounter: Payer: PPO | Attending: General Surgery | Admitting: Skilled Nursing Facility1

## 2017-02-12 DIAGNOSIS — E119 Type 2 diabetes mellitus without complications: Secondary | ICD-10-CM | POA: Insufficient documentation

## 2017-02-12 DIAGNOSIS — E639 Nutritional deficiency, unspecified: Secondary | ICD-10-CM | POA: Insufficient documentation

## 2017-02-12 DIAGNOSIS — Z713 Dietary counseling and surveillance: Secondary | ICD-10-CM | POA: Diagnosis not present

## 2017-02-12 NOTE — Progress Notes (Signed)
Follow-up visit:  8 Weeks Post-Operative RYGB Surgery  Primary concerns today: Post-operative Bariatric Surgery Nutrition Management. Pt has had the RYGB due to a polyp in her gut.Pt arrives with her supportive husband.   Pt states she is now only taking metformin and levemir and januvia: to meet with doctor November 26th. Pt states she checks her blood sugar 2-3 times a day: 137-157. Pt states she has been well. Pt states she ate raw vegetables 1 month ago and vomited. Pt states her main gaol is to be 150 pounds. Pt states she just started walking. Pt states she has always gotten sick if walking after eating. Pt is not interested in BELT due to too many people.  Pt is making inappropriate food choices: fried shrimp, french fries, lomain, etc. Pt requires extensive nutrition education.   Surgery date: 12/18/2016 Surgery type: RYGB Start weight at [facility]: 314 Weight today: 292.4 Weight change: 10  TANITA  BODY COMP RESULTS  01/02/2017 02/12/2017   BMI (kg/m^2) 53.5 51.8   Fat Mass (lbs) 168.6 160.2   Fat Free Mass (lbs) 133.4 132.2   Total Body Water (lbs) 99.2 98    24-hr recall: B (AM): 1 egg and 2 sausage patties and 1 medium sized muffin  Snk (AM):  L (PM): rolled up meat and cheese or deli meat chicken or soup or shrimp Snk (PM):   D (PM): fried popcorn shrimp with rice and french fries Snk (PM): chips  Fluid intake: water with crystal light, diet mountain dew, diet coke Estimated total protein intake: 60+  Medications: See List Supplementation:   CBG monitoring: 2-3 times Average CBG per patient: 137-157 Last patient reported A1c: 8  Using straws: no Drinking while eating: no Having you been chewing well:no Chewing/swallowing difficulties: no Changes in vision: no Changes to mood/headaches: no Hair loss/Cahnges to skin/Changes to nails: no Any difficulty focusing or concentrating: no Sweating: no Dizziness/Lightheaded:  Palpitations: no  Carbonated beverages:  no N/V/D/C/GAS: vomiting 1 time Abdominal Pain: no Dumping syndrome: 1time  Recent physical activity:  Has walked 1 day  Progress Towards Goal(s):  In progress.    Nutritional Diagnosis:  [place]-3.3 Overweight/obesity related to past poor dietary habits and physical inactivity as evidenced by patient w/ recent RYGB surgery following dietary guidelines for continued weight loss.    Intervention:  Nutrition counseling for having had bariatric surgery. Sit at the table while eating Chew until applesauce consistency Put meals together with the meal ideas sheet Log all food and beverages   Teaching Method Utilized:  Visual Auditory Hands on  Barriers to learning/adherence to lifestyle change: lower ability of cognition   Demonstrated degree of understanding via:  Teach Back   Monitoring/Evaluation:  Dietary intake, exercise, and body weight.

## 2017-02-19 DIAGNOSIS — G4730 Sleep apnea, unspecified: Secondary | ICD-10-CM | POA: Diagnosis not present

## 2017-02-19 DIAGNOSIS — E782 Mixed hyperlipidemia: Secondary | ICD-10-CM | POA: Diagnosis not present

## 2017-02-19 DIAGNOSIS — I10 Essential (primary) hypertension: Secondary | ICD-10-CM | POA: Diagnosis not present

## 2017-02-19 DIAGNOSIS — E1121 Type 2 diabetes mellitus with diabetic nephropathy: Secondary | ICD-10-CM | POA: Diagnosis not present

## 2017-02-25 DIAGNOSIS — F319 Bipolar disorder, unspecified: Secondary | ICD-10-CM | POA: Diagnosis not present

## 2017-02-25 DIAGNOSIS — F1120 Opioid dependence, uncomplicated: Secondary | ICD-10-CM | POA: Diagnosis not present

## 2017-02-25 DIAGNOSIS — M5441 Lumbago with sciatica, right side: Secondary | ICD-10-CM | POA: Diagnosis not present

## 2017-02-25 DIAGNOSIS — M545 Low back pain: Secondary | ICD-10-CM | POA: Diagnosis not present

## 2017-02-25 DIAGNOSIS — E669 Obesity, unspecified: Secondary | ICD-10-CM | POA: Diagnosis not present

## 2017-02-25 DIAGNOSIS — F419 Anxiety disorder, unspecified: Secondary | ICD-10-CM | POA: Diagnosis not present

## 2017-02-25 DIAGNOSIS — G894 Chronic pain syndrome: Secondary | ICD-10-CM | POA: Diagnosis not present

## 2017-02-25 DIAGNOSIS — Z79899 Other long term (current) drug therapy: Secondary | ICD-10-CM | POA: Diagnosis not present

## 2017-03-13 ENCOUNTER — Encounter: Payer: Self-pay | Admitting: Skilled Nursing Facility1

## 2017-03-13 ENCOUNTER — Encounter: Payer: PPO | Attending: General Surgery | Admitting: Skilled Nursing Facility1

## 2017-03-13 DIAGNOSIS — Z713 Dietary counseling and surveillance: Secondary | ICD-10-CM | POA: Insufficient documentation

## 2017-03-13 DIAGNOSIS — E639 Nutritional deficiency, unspecified: Secondary | ICD-10-CM | POA: Diagnosis not present

## 2017-03-13 DIAGNOSIS — E119 Type 2 diabetes mellitus without complications: Secondary | ICD-10-CM | POA: Diagnosis not present

## 2017-03-13 NOTE — Patient Instructions (Addendum)
-  Have vegetables with every lunch and every dinner every day  -Drink 64 fluid ounces every day  -Take 30 minutes to eat your food  -Eat within 1-1.5 hours of waking

## 2017-03-13 NOTE — Progress Notes (Signed)
Follow-up visit:  8 Weeks Post-Operative RYGB Surgery  Primary concerns today: Post-operative Bariatric Surgery Nutrition Management. Pt has had the RYGB due to a polyp in her gut.Pt arrives with her supportive husband.  Pt is not interested in BELT due to too many people.   Pt states she is checking her blood sugars: 130-150. Pt states she has been visiting her mom in the hospital so she has been eating sandwiches. Pt state she feels like she is doing good. Pt states she does not have any options for lunch. Pt states she is very proud of herself and thinks she has been very successful.   Surgery date: 12/18/2016 Surgery type: RYGB Start weight at [facility]: 314 Weight today: 292.4 Weight change: 2  TANITA  BODY COMP RESULTS  01/02/2017 02/12/2017 03/13/2017   BMI (kg/m^2) 53.5 51.8 51.4   Fat Mass (lbs) 168.6 160.2 160.8   Fat Free Mass (lbs) 133.4 132.2 129.6   Total Body Water (lbs) 99.2 98 96    24-hr recall: B (AM): 1 egg and 2 sausage patties and 1 medium sized muffin  Snk (AM):  L (PM): soup and whole sandwich Snk (PM):   D (PM): whole sandwich and salad Snk (PM): chips  Fluid intake: water with crystal light, 1 diet mountain dew, diet coke: 40 oz Estimated total protein intake: 60+  Medications: See List Supplementation: celebrate and not taking calcium   CBG monitoring: 2-3 times Average CBG per patient: 137-157 Last patient reported A1c: 8  Using straws: no Drinking while eating: no Having you been chewing well:no Chewing/swallowing difficulties: no Changes in vision: no Changes to mood/headaches: no Hair loss/Cahnges to skin/Changes to nails: no Any difficulty focusing or concentrating: no Sweating: no Dizziness/Lightheaded:  Palpitations: no  Carbonated beverages: no N/V/D/C/GAS: having a bowel movement every day: hard stools Abdominal Pain: no Dumping syndrome: 1time  Recent physical activity:  ADL's  Progress Towards Goal(s):  In  progress.    Nutritional Diagnosis:  [place]-3.3 Overweight/obesity related to past poor dietary habits and physical inactivity as evidenced by patient w/ recent RYGB surgery following dietary guidelines for continued weight loss.    Intervention:  Nutrition counseling for having had bariatric surgery. Sit at the table while eating Chew until applesauce consistency Put meals together with the meal ideas sheet Log all food and beverages   Teaching Method Utilized:  Visual Auditory Hands on  Barriers to learning/adherence to lifestyle change: lower ability of cognition   Demonstrated degree of understanding via:  Teach Back   Monitoring/Evaluation:  Dietary intake, exercise, and body weight.

## 2017-03-14 DIAGNOSIS — I480 Paroxysmal atrial fibrillation: Secondary | ICD-10-CM | POA: Diagnosis not present

## 2017-03-14 DIAGNOSIS — G4730 Sleep apnea, unspecified: Secondary | ICD-10-CM | POA: Diagnosis not present

## 2017-03-14 DIAGNOSIS — I10 Essential (primary) hypertension: Secondary | ICD-10-CM | POA: Diagnosis not present

## 2017-03-22 DIAGNOSIS — E782 Mixed hyperlipidemia: Secondary | ICD-10-CM | POA: Diagnosis not present

## 2017-03-22 DIAGNOSIS — R5383 Other fatigue: Secondary | ICD-10-CM | POA: Diagnosis not present

## 2017-03-22 DIAGNOSIS — E119 Type 2 diabetes mellitus without complications: Secondary | ICD-10-CM | POA: Diagnosis not present

## 2017-03-22 DIAGNOSIS — E559 Vitamin D deficiency, unspecified: Secondary | ICD-10-CM | POA: Diagnosis not present

## 2017-03-22 DIAGNOSIS — I10 Essential (primary) hypertension: Secondary | ICD-10-CM | POA: Diagnosis not present

## 2017-03-22 DIAGNOSIS — G4730 Sleep apnea, unspecified: Secondary | ICD-10-CM | POA: Diagnosis not present

## 2017-04-10 DIAGNOSIS — E1165 Type 2 diabetes mellitus with hyperglycemia: Secondary | ICD-10-CM | POA: Diagnosis not present

## 2017-04-22 DIAGNOSIS — E669 Obesity, unspecified: Secondary | ICD-10-CM | POA: Diagnosis not present

## 2017-04-22 DIAGNOSIS — F319 Bipolar disorder, unspecified: Secondary | ICD-10-CM | POA: Diagnosis not present

## 2017-04-22 DIAGNOSIS — Z79899 Other long term (current) drug therapy: Secondary | ICD-10-CM | POA: Diagnosis not present

## 2017-04-22 DIAGNOSIS — F1120 Opioid dependence, uncomplicated: Secondary | ICD-10-CM | POA: Diagnosis not present

## 2017-04-22 DIAGNOSIS — G4733 Obstructive sleep apnea (adult) (pediatric): Secondary | ICD-10-CM | POA: Diagnosis not present

## 2017-04-22 DIAGNOSIS — G894 Chronic pain syndrome: Secondary | ICD-10-CM | POA: Diagnosis not present

## 2017-04-22 DIAGNOSIS — M5441 Lumbago with sciatica, right side: Secondary | ICD-10-CM | POA: Diagnosis not present

## 2017-04-24 ENCOUNTER — Ambulatory Visit: Payer: Self-pay | Admitting: Skilled Nursing Facility1

## 2017-04-29 DIAGNOSIS — G894 Chronic pain syndrome: Secondary | ICD-10-CM | POA: Diagnosis not present

## 2017-04-29 DIAGNOSIS — F319 Bipolar disorder, unspecified: Secondary | ICD-10-CM | POA: Diagnosis not present

## 2017-04-29 DIAGNOSIS — F1120 Opioid dependence, uncomplicated: Secondary | ICD-10-CM | POA: Diagnosis not present

## 2017-04-29 DIAGNOSIS — E669 Obesity, unspecified: Secondary | ICD-10-CM | POA: Diagnosis not present

## 2017-04-29 DIAGNOSIS — M5441 Lumbago with sciatica, right side: Secondary | ICD-10-CM | POA: Diagnosis not present

## 2017-04-29 DIAGNOSIS — Z79899 Other long term (current) drug therapy: Secondary | ICD-10-CM | POA: Diagnosis not present

## 2017-05-13 DIAGNOSIS — E669 Obesity, unspecified: Secondary | ICD-10-CM | POA: Diagnosis not present

## 2017-05-13 DIAGNOSIS — F1120 Opioid dependence, uncomplicated: Secondary | ICD-10-CM | POA: Diagnosis not present

## 2017-05-13 DIAGNOSIS — G894 Chronic pain syndrome: Secondary | ICD-10-CM | POA: Diagnosis not present

## 2017-05-13 DIAGNOSIS — F319 Bipolar disorder, unspecified: Secondary | ICD-10-CM | POA: Diagnosis not present

## 2017-05-13 DIAGNOSIS — Z79899 Other long term (current) drug therapy: Secondary | ICD-10-CM | POA: Diagnosis not present

## 2017-05-13 DIAGNOSIS — M5441 Lumbago with sciatica, right side: Secondary | ICD-10-CM | POA: Diagnosis not present

## 2017-05-21 DIAGNOSIS — E119 Type 2 diabetes mellitus without complications: Secondary | ICD-10-CM | POA: Diagnosis not present

## 2017-05-21 DIAGNOSIS — R5383 Other fatigue: Secondary | ICD-10-CM | POA: Diagnosis not present

## 2017-05-21 DIAGNOSIS — E559 Vitamin D deficiency, unspecified: Secondary | ICD-10-CM | POA: Diagnosis not present

## 2017-05-21 DIAGNOSIS — I10 Essential (primary) hypertension: Secondary | ICD-10-CM | POA: Diagnosis not present

## 2017-05-21 DIAGNOSIS — E782 Mixed hyperlipidemia: Secondary | ICD-10-CM | POA: Diagnosis not present

## 2017-05-23 DIAGNOSIS — B029 Zoster without complications: Secondary | ICD-10-CM | POA: Diagnosis not present

## 2017-05-23 DIAGNOSIS — E119 Type 2 diabetes mellitus without complications: Secondary | ICD-10-CM | POA: Diagnosis not present

## 2017-05-23 DIAGNOSIS — G4730 Sleep apnea, unspecified: Secondary | ICD-10-CM | POA: Diagnosis not present

## 2017-05-23 DIAGNOSIS — I10 Essential (primary) hypertension: Secondary | ICD-10-CM | POA: Diagnosis not present

## 2017-05-23 DIAGNOSIS — E1142 Type 2 diabetes mellitus with diabetic polyneuropathy: Secondary | ICD-10-CM | POA: Diagnosis not present

## 2017-05-23 DIAGNOSIS — E782 Mixed hyperlipidemia: Secondary | ICD-10-CM | POA: Diagnosis not present

## 2017-06-07 DIAGNOSIS — E119 Type 2 diabetes mellitus without complications: Secondary | ICD-10-CM | POA: Diagnosis not present

## 2017-06-07 DIAGNOSIS — I10 Essential (primary) hypertension: Secondary | ICD-10-CM | POA: Diagnosis not present

## 2017-06-07 DIAGNOSIS — G4730 Sleep apnea, unspecified: Secondary | ICD-10-CM | POA: Diagnosis not present

## 2017-06-07 DIAGNOSIS — E782 Mixed hyperlipidemia: Secondary | ICD-10-CM | POA: Diagnosis not present

## 2017-06-07 DIAGNOSIS — B029 Zoster without complications: Secondary | ICD-10-CM | POA: Diagnosis not present

## 2017-06-10 DIAGNOSIS — M545 Low back pain: Secondary | ICD-10-CM | POA: Diagnosis not present

## 2017-06-10 DIAGNOSIS — M5441 Lumbago with sciatica, right side: Secondary | ICD-10-CM | POA: Diagnosis not present

## 2017-06-10 DIAGNOSIS — F1120 Opioid dependence, uncomplicated: Secondary | ICD-10-CM | POA: Diagnosis not present

## 2017-06-10 DIAGNOSIS — Z79899 Other long term (current) drug therapy: Secondary | ICD-10-CM | POA: Diagnosis not present

## 2017-06-10 DIAGNOSIS — F419 Anxiety disorder, unspecified: Secondary | ICD-10-CM | POA: Diagnosis not present

## 2017-06-10 DIAGNOSIS — F319 Bipolar disorder, unspecified: Secondary | ICD-10-CM | POA: Diagnosis not present

## 2017-06-10 DIAGNOSIS — G894 Chronic pain syndrome: Secondary | ICD-10-CM | POA: Diagnosis not present

## 2017-06-10 DIAGNOSIS — E669 Obesity, unspecified: Secondary | ICD-10-CM | POA: Diagnosis not present

## 2017-06-13 DIAGNOSIS — E782 Mixed hyperlipidemia: Secondary | ICD-10-CM | POA: Diagnosis not present

## 2017-06-13 DIAGNOSIS — I25119 Atherosclerotic heart disease of native coronary artery with unspecified angina pectoris: Secondary | ICD-10-CM | POA: Diagnosis not present

## 2017-06-13 DIAGNOSIS — I4891 Unspecified atrial fibrillation: Secondary | ICD-10-CM | POA: Diagnosis not present

## 2017-06-13 DIAGNOSIS — R0602 Shortness of breath: Secondary | ICD-10-CM | POA: Diagnosis not present

## 2017-06-13 DIAGNOSIS — I10 Essential (primary) hypertension: Secondary | ICD-10-CM | POA: Diagnosis not present

## 2017-06-13 DIAGNOSIS — I350 Nonrheumatic aortic (valve) stenosis: Secondary | ICD-10-CM | POA: Diagnosis not present

## 2017-06-17 ENCOUNTER — Other Ambulatory Visit: Payer: Self-pay | Admitting: Internal Medicine

## 2017-06-17 DIAGNOSIS — Z1231 Encounter for screening mammogram for malignant neoplasm of breast: Secondary | ICD-10-CM

## 2017-06-18 DIAGNOSIS — I2510 Atherosclerotic heart disease of native coronary artery without angina pectoris: Secondary | ICD-10-CM | POA: Diagnosis not present

## 2017-06-20 DIAGNOSIS — I350 Nonrheumatic aortic (valve) stenosis: Secondary | ICD-10-CM | POA: Diagnosis not present

## 2017-06-20 DIAGNOSIS — I480 Paroxysmal atrial fibrillation: Secondary | ICD-10-CM | POA: Diagnosis not present

## 2017-06-20 DIAGNOSIS — I10 Essential (primary) hypertension: Secondary | ICD-10-CM | POA: Diagnosis not present

## 2017-06-20 DIAGNOSIS — I4891 Unspecified atrial fibrillation: Secondary | ICD-10-CM | POA: Diagnosis not present

## 2017-06-20 DIAGNOSIS — R0602 Shortness of breath: Secondary | ICD-10-CM | POA: Diagnosis not present

## 2017-06-20 DIAGNOSIS — E782 Mixed hyperlipidemia: Secondary | ICD-10-CM | POA: Diagnosis not present

## 2017-06-20 DIAGNOSIS — I420 Dilated cardiomyopathy: Secondary | ICD-10-CM | POA: Diagnosis not present

## 2017-06-21 DIAGNOSIS — E119 Type 2 diabetes mellitus without complications: Secondary | ICD-10-CM | POA: Diagnosis not present

## 2017-06-21 DIAGNOSIS — B029 Zoster without complications: Secondary | ICD-10-CM | POA: Diagnosis not present

## 2017-06-21 DIAGNOSIS — E782 Mixed hyperlipidemia: Secondary | ICD-10-CM | POA: Diagnosis not present

## 2017-06-21 DIAGNOSIS — G4730 Sleep apnea, unspecified: Secondary | ICD-10-CM | POA: Diagnosis not present

## 2017-06-21 DIAGNOSIS — I10 Essential (primary) hypertension: Secondary | ICD-10-CM | POA: Diagnosis not present

## 2017-06-28 ENCOUNTER — Ambulatory Visit
Admission: RE | Admit: 2017-06-28 | Discharge: 2017-06-28 | Disposition: A | Discharge status: Home / Self Care | Payer: PPO | Source: Ambulatory Visit | Attending: Internal Medicine | Admitting: Internal Medicine

## 2017-06-28 DIAGNOSIS — Z1231 Encounter for screening mammogram for malignant neoplasm of breast: Secondary | ICD-10-CM | POA: Diagnosis not present

## 2017-07-22 DIAGNOSIS — F319 Bipolar disorder, unspecified: Secondary | ICD-10-CM | POA: Diagnosis not present

## 2017-07-22 DIAGNOSIS — Z79899 Other long term (current) drug therapy: Secondary | ICD-10-CM | POA: Diagnosis not present

## 2017-07-22 DIAGNOSIS — M5441 Lumbago with sciatica, right side: Secondary | ICD-10-CM | POA: Diagnosis not present

## 2017-07-22 DIAGNOSIS — G894 Chronic pain syndrome: Secondary | ICD-10-CM | POA: Diagnosis not present

## 2017-07-22 DIAGNOSIS — F1120 Opioid dependence, uncomplicated: Secondary | ICD-10-CM | POA: Diagnosis not present

## 2017-07-22 DIAGNOSIS — M545 Low back pain: Secondary | ICD-10-CM | POA: Diagnosis not present

## 2017-07-22 DIAGNOSIS — E669 Obesity, unspecified: Secondary | ICD-10-CM | POA: Diagnosis not present

## 2017-07-22 DIAGNOSIS — F419 Anxiety disorder, unspecified: Secondary | ICD-10-CM | POA: Diagnosis not present

## 2017-07-31 DIAGNOSIS — E119 Type 2 diabetes mellitus without complications: Secondary | ICD-10-CM | POA: Diagnosis not present

## 2017-08-28 DIAGNOSIS — I10 Essential (primary) hypertension: Secondary | ICD-10-CM | POA: Diagnosis not present

## 2017-08-28 DIAGNOSIS — E119 Type 2 diabetes mellitus without complications: Secondary | ICD-10-CM | POA: Diagnosis not present

## 2017-08-28 DIAGNOSIS — E782 Mixed hyperlipidemia: Secondary | ICD-10-CM | POA: Diagnosis not present

## 2017-08-30 DIAGNOSIS — I4891 Unspecified atrial fibrillation: Secondary | ICD-10-CM | POA: Diagnosis not present

## 2017-08-30 DIAGNOSIS — I480 Paroxysmal atrial fibrillation: Secondary | ICD-10-CM | POA: Diagnosis not present

## 2017-08-30 DIAGNOSIS — I10 Essential (primary) hypertension: Secondary | ICD-10-CM | POA: Diagnosis not present

## 2017-08-30 DIAGNOSIS — E039 Hypothyroidism, unspecified: Secondary | ICD-10-CM | POA: Diagnosis not present

## 2017-08-30 DIAGNOSIS — E1140 Type 2 diabetes mellitus with diabetic neuropathy, unspecified: Secondary | ICD-10-CM | POA: Diagnosis not present

## 2017-08-30 DIAGNOSIS — G4730 Sleep apnea, unspecified: Secondary | ICD-10-CM | POA: Diagnosis not present

## 2017-08-30 DIAGNOSIS — E1129 Type 2 diabetes mellitus with other diabetic kidney complication: Secondary | ICD-10-CM | POA: Diagnosis not present

## 2017-08-30 DIAGNOSIS — D509 Iron deficiency anemia, unspecified: Secondary | ICD-10-CM | POA: Diagnosis not present

## 2017-08-30 DIAGNOSIS — E782 Mixed hyperlipidemia: Secondary | ICD-10-CM | POA: Diagnosis not present

## 2017-09-06 IMAGING — CT CT CHEST W/ CM
2 of 6 series · 11 of 36 positions shown, 17 images · IV contrast (READICAT/WATER & [ID] ISOVUE 300)
Comparison: Chest radiograph December 13, 2013

ADDENDUM:
CT chest and CT abdomen reports are combined into a single
dictation.
CLINICAL DATA: Gastric mass

EXAM:
CT CHEST AND ABDOMEN WITH CONTRAST
TECHNIQUE: Multidetector CT imaging of the chest and abdomen was performed
following the standard protocol during bolus administration of
intravenous contrast. Oral contrast was administered for the abdomen
CT portion of the study.
CONTRAST:  125 mL Isovue-PFF nonionic

[Series 601: coronal body · coronal · 0.98mm/px · 1 of 160 slices shown, 2 images]
[im 54/160  soft-tissue]
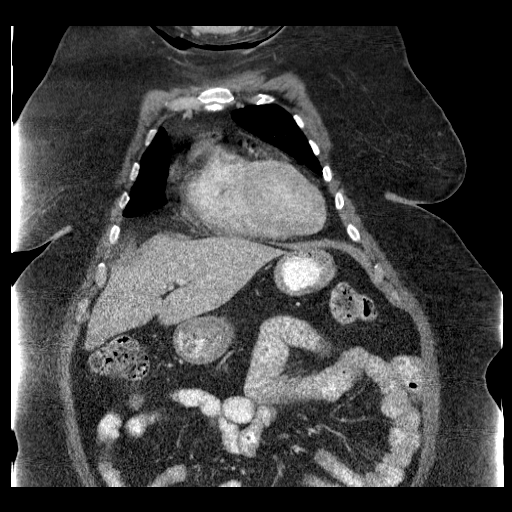
[im 54/160  bone]
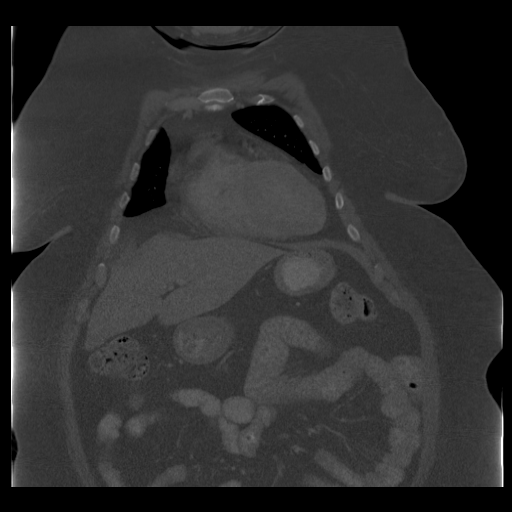

[Series 602: sagittal body · sagittal · 0.98mm/px · 10 of 200 slices shown, 15 images]
[im 16/200  soft-tissue]
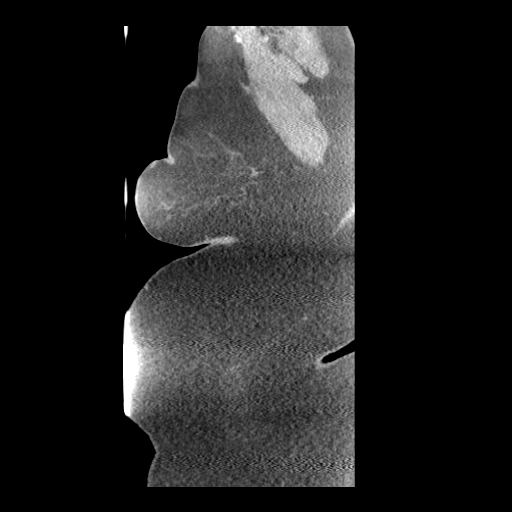
[im 16/200  lung]
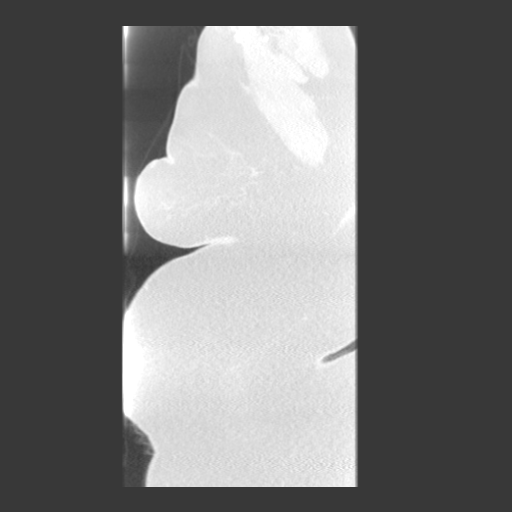
[im 16/200  bone]
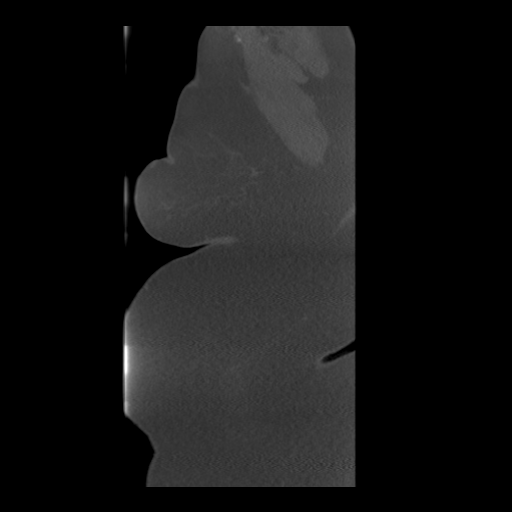
[im 31/200  lung]
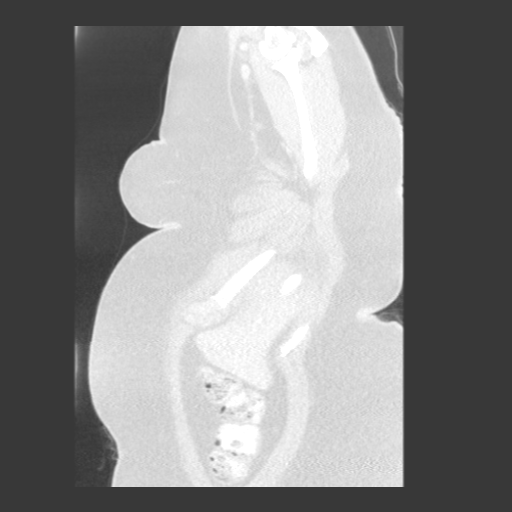
[im 46/200  soft-tissue]
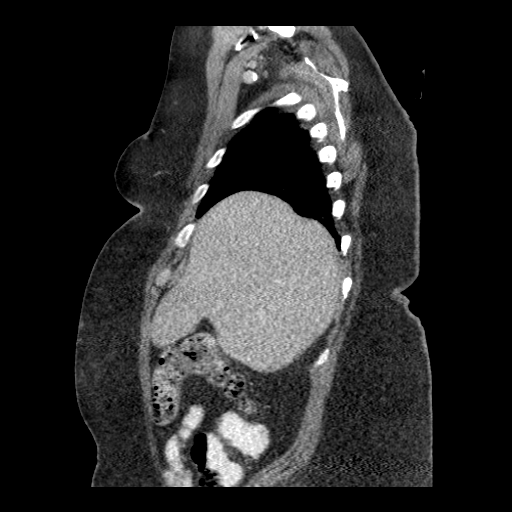
[im 46/200  lung]
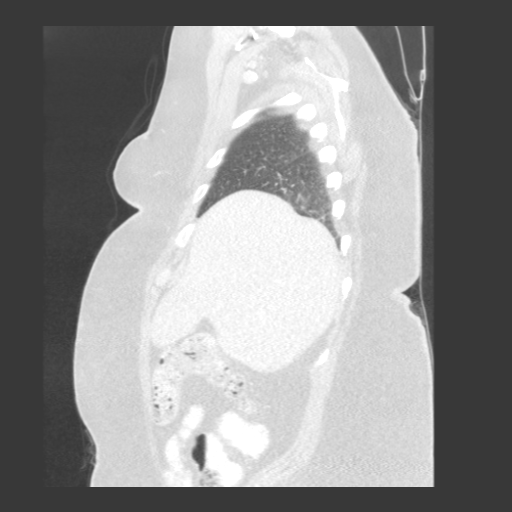
[im 62/200  soft-tissue]
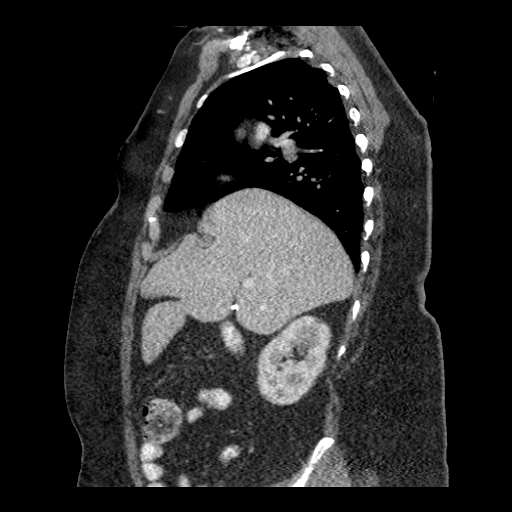
[im 62/200  lung]
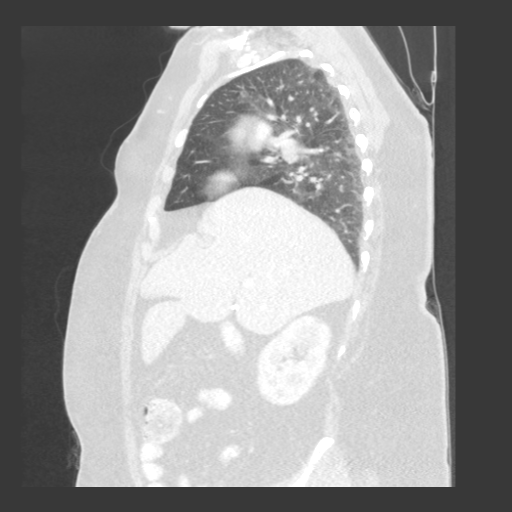
[im 77/200  soft-tissue]
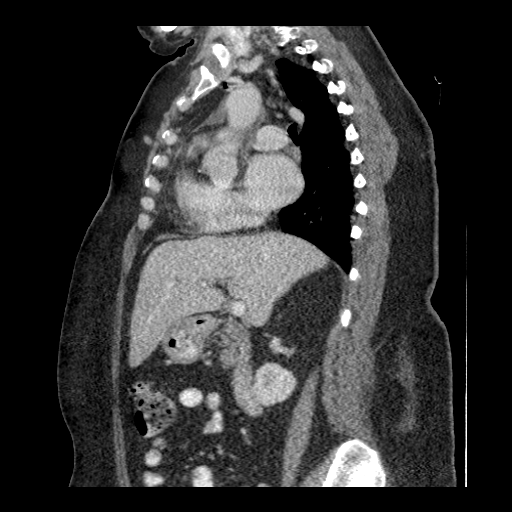
[im 108/200  soft-tissue]
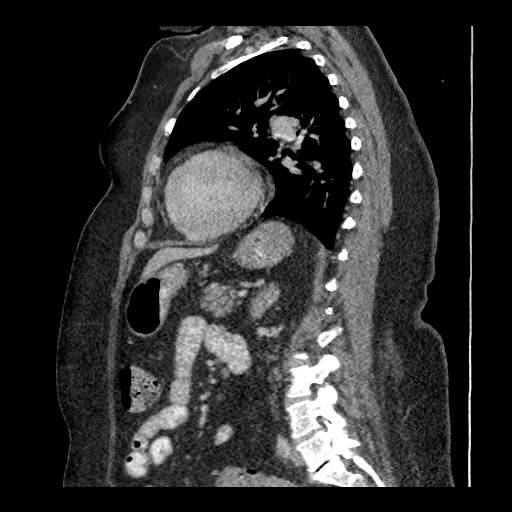
[im 123/200  soft-tissue]
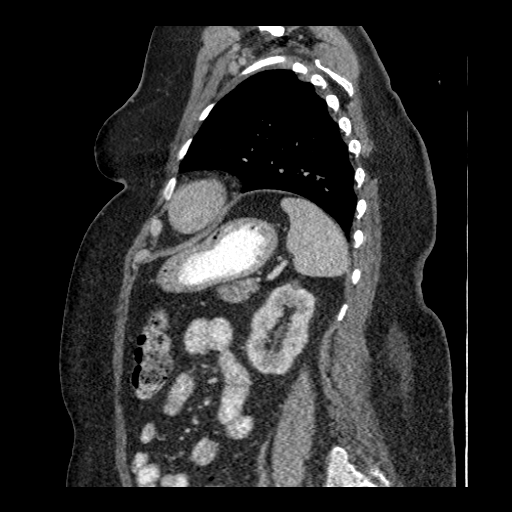
[im 138/200  soft-tissue]
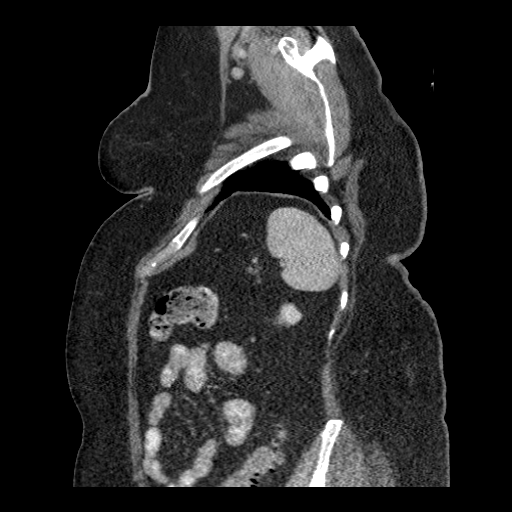
[im 169/200  soft-tissue]
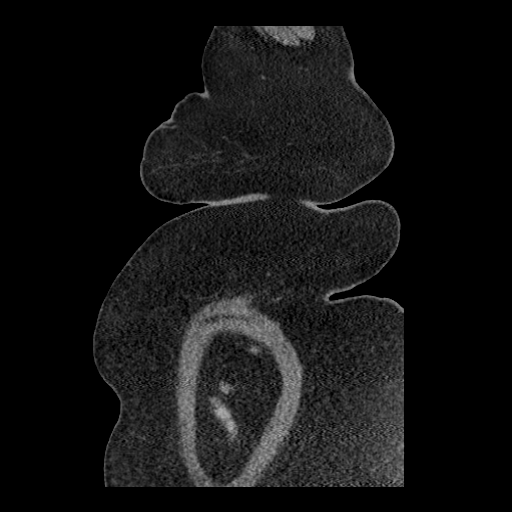
[im 184/200  soft-tissue]
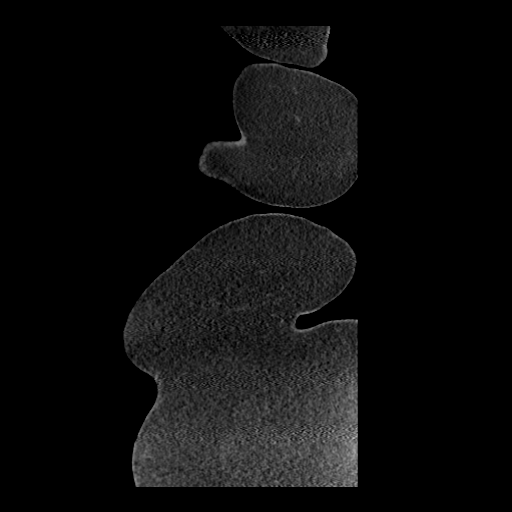
[im 184/200  bone]
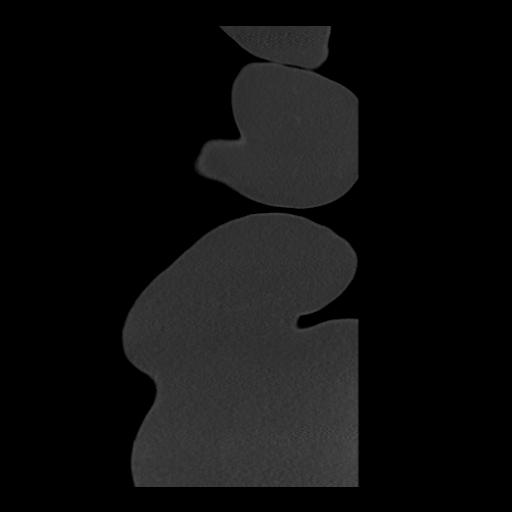

[11 of 36 positions shown; findings below may reference images not displayed]

FINDINGS: CT CHEST FINDINGS

Cardiovascular: The ascending thoracic aortic diameter measures
x 3.9 cm, measured on axial slice 20 series 3. There is no thoracic
aortic dissection evident. Visualized great vessels appear
unremarkable. There are foci of coronary artery calcification.
Pericardium is not appreciably thickened.

Mediastinum/Nodes: Thyroid appears normal. There are several
subcentimeter mediastinal lymph nodes. A lymph noted to the left of
the aortic arch measures 1.5 x 0.9 cm.

Lungs/Pleura: There is patchy atelectasis in both lower lung zone
regions. There is no edema or consolidation. There is no parenchymal
lung nodular opacity. No pleural effusion or pleural thickening
evident.

Musculoskeletal: There is degenerative change in the thoracic spine.
There are no blastic or lytic bone lesions.

CT ABDOMEN FINDINGS

Hepatobiliary: Liver measures 20.7 cm in length. No focal liver
lesions are evident. Gallbladder is absent. There is no biliary duct
dilatation.

Pancreas: No pancreatic mass or inflammatory focus.

Spleen: No splenic lesions are evident.

Adrenals/Urinary Tract: Adrenals appear normal bilaterally. Kidneys
bilaterally show no mass or hydronephrosis on either side. There is
no renal or proximal ureteral calculus on either side.

Stomach/Bowel: There is wall thickening in portions of the body and
antrum of the stomach. No well-defined gastric mass is evident by
CT. There is no perigastric soft tissue stranding or fluid. Bowel
elsewhere in the abdomen and upper pelvis shows no evidence of wall
thickening. There are scattered colonic diverticula without
diverticulitis. No evident bowel obstruction. No free air or portal
venous air. There is mild lipomatous infiltration of the ileocecal
valve.

Vascular/Lymphatic: There are foci of atherosclerotic calcification
in the aorta and proximal common iliac arteries without aneurysm.
The major mesenteric vessels appear patent. No adenopathy is
appreciable in the abdomen or upper pelvis.

Other: There is no abscess or ascites evident.

Musculoskeletal: There are no blastic or lytic bone lesions. There
is degenerative change in the lumbar spine.
IMPRESSION: CT chest: Patchy atelectasis bilaterally. No airspace consolidation
or parenchymal lung mass. Single borderline prominent lymph node
adjacent to the aortic arch on the left.

Ascending thoracic aortic diameter measures 4.0 x 3.9 cm. Recommend
annual imaging followup by CTA or MRA. This recommendation follows
2030 ACCF/AHA/AATS/ACR/ASA/SCA/VINEERI/AKIRE/CEEJAY/HICKLIN Guidelines for the
Diagnosis and Management of Patients with Thoracic Aortic Disease.
Circulation. 2030; 121: [id number]. Foci of coronary artery
calcification noted.

CT abdomen: There is gastric wall thickening without mass evident by
CT. There is no perigastric soft tissue stranding or fistula. No
fluid or adenopathy in the perigastric region.

No adenopathy elsewhere in the abdomen or upper pelvis. No focal
liver lesions identified. Gallbladder is absent.

There is a degree of aortoiliac atherosclerosis.

## 2017-09-16 DIAGNOSIS — M5441 Lumbago with sciatica, right side: Secondary | ICD-10-CM | POA: Diagnosis not present

## 2017-09-16 DIAGNOSIS — F319 Bipolar disorder, unspecified: Secondary | ICD-10-CM | POA: Diagnosis not present

## 2017-09-16 DIAGNOSIS — M545 Low back pain: Secondary | ICD-10-CM | POA: Diagnosis not present

## 2017-09-16 DIAGNOSIS — F419 Anxiety disorder, unspecified: Secondary | ICD-10-CM | POA: Diagnosis not present

## 2017-09-16 DIAGNOSIS — Z79899 Other long term (current) drug therapy: Secondary | ICD-10-CM | POA: Diagnosis not present

## 2017-09-16 DIAGNOSIS — G894 Chronic pain syndrome: Secondary | ICD-10-CM | POA: Diagnosis not present

## 2017-09-16 DIAGNOSIS — E669 Obesity, unspecified: Secondary | ICD-10-CM | POA: Diagnosis not present

## 2017-09-16 DIAGNOSIS — F1120 Opioid dependence, uncomplicated: Secondary | ICD-10-CM | POA: Diagnosis not present

## 2017-10-21 DIAGNOSIS — R079 Chest pain, unspecified: Secondary | ICD-10-CM | POA: Diagnosis not present

## 2017-10-21 DIAGNOSIS — I10 Essential (primary) hypertension: Secondary | ICD-10-CM | POA: Diagnosis not present

## 2017-10-21 DIAGNOSIS — E782 Mixed hyperlipidemia: Secondary | ICD-10-CM | POA: Diagnosis not present

## 2017-10-21 DIAGNOSIS — F17200 Nicotine dependence, unspecified, uncomplicated: Secondary | ICD-10-CM | POA: Diagnosis not present

## 2017-10-21 DIAGNOSIS — F17210 Nicotine dependence, cigarettes, uncomplicated: Secondary | ICD-10-CM | POA: Diagnosis not present

## 2017-10-21 DIAGNOSIS — I350 Nonrheumatic aortic (valve) stenosis: Secondary | ICD-10-CM | POA: Diagnosis not present

## 2017-10-21 DIAGNOSIS — G4730 Sleep apnea, unspecified: Secondary | ICD-10-CM | POA: Diagnosis not present

## 2017-10-21 DIAGNOSIS — R0602 Shortness of breath: Secondary | ICD-10-CM | POA: Diagnosis not present

## 2017-10-21 DIAGNOSIS — I420 Dilated cardiomyopathy: Secondary | ICD-10-CM | POA: Diagnosis not present

## 2017-10-21 DIAGNOSIS — I4891 Unspecified atrial fibrillation: Secondary | ICD-10-CM | POA: Diagnosis not present

## 2017-11-12 DIAGNOSIS — F419 Anxiety disorder, unspecified: Secondary | ICD-10-CM | POA: Diagnosis not present

## 2017-11-12 DIAGNOSIS — F1120 Opioid dependence, uncomplicated: Secondary | ICD-10-CM | POA: Diagnosis not present

## 2017-11-12 DIAGNOSIS — F319 Bipolar disorder, unspecified: Secondary | ICD-10-CM | POA: Diagnosis not present

## 2017-11-12 DIAGNOSIS — M545 Low back pain: Secondary | ICD-10-CM | POA: Diagnosis not present

## 2017-11-12 DIAGNOSIS — E669 Obesity, unspecified: Secondary | ICD-10-CM | POA: Diagnosis not present

## 2017-11-12 DIAGNOSIS — Z79899 Other long term (current) drug therapy: Secondary | ICD-10-CM | POA: Diagnosis not present

## 2017-11-12 DIAGNOSIS — M5441 Lumbago with sciatica, right side: Secondary | ICD-10-CM | POA: Diagnosis not present

## 2017-11-12 DIAGNOSIS — G894 Chronic pain syndrome: Secondary | ICD-10-CM | POA: Diagnosis not present

## 2017-12-04 DIAGNOSIS — E782 Mixed hyperlipidemia: Secondary | ICD-10-CM | POA: Diagnosis not present

## 2017-12-04 DIAGNOSIS — E1129 Type 2 diabetes mellitus with other diabetic kidney complication: Secondary | ICD-10-CM | POA: Diagnosis not present

## 2017-12-04 DIAGNOSIS — I4891 Unspecified atrial fibrillation: Secondary | ICD-10-CM | POA: Diagnosis not present

## 2017-12-04 DIAGNOSIS — I10 Essential (primary) hypertension: Secondary | ICD-10-CM | POA: Diagnosis not present

## 2017-12-04 DIAGNOSIS — D509 Iron deficiency anemia, unspecified: Secondary | ICD-10-CM | POA: Diagnosis not present

## 2017-12-05 DIAGNOSIS — G4733 Obstructive sleep apnea (adult) (pediatric): Secondary | ICD-10-CM | POA: Diagnosis not present

## 2017-12-06 DIAGNOSIS — Z23 Encounter for immunization: Secondary | ICD-10-CM | POA: Diagnosis not present

## 2017-12-06 DIAGNOSIS — E119 Type 2 diabetes mellitus without complications: Secondary | ICD-10-CM | POA: Diagnosis not present

## 2017-12-06 DIAGNOSIS — F411 Generalized anxiety disorder: Secondary | ICD-10-CM | POA: Diagnosis not present

## 2017-12-06 DIAGNOSIS — I10 Essential (primary) hypertension: Secondary | ICD-10-CM | POA: Diagnosis not present

## 2017-12-06 DIAGNOSIS — G4730 Sleep apnea, unspecified: Secondary | ICD-10-CM | POA: Diagnosis not present

## 2017-12-06 DIAGNOSIS — E782 Mixed hyperlipidemia: Secondary | ICD-10-CM | POA: Diagnosis not present

## 2017-12-20 DIAGNOSIS — I10 Essential (primary) hypertension: Secondary | ICD-10-CM | POA: Diagnosis not present

## 2017-12-20 DIAGNOSIS — E782 Mixed hyperlipidemia: Secondary | ICD-10-CM | POA: Diagnosis not present

## 2017-12-20 DIAGNOSIS — F411 Generalized anxiety disorder: Secondary | ICD-10-CM | POA: Diagnosis not present

## 2017-12-20 DIAGNOSIS — E1121 Type 2 diabetes mellitus with diabetic nephropathy: Secondary | ICD-10-CM | POA: Diagnosis not present

## 2017-12-20 DIAGNOSIS — G4730 Sleep apnea, unspecified: Secondary | ICD-10-CM | POA: Diagnosis not present

## 2018-01-10 DIAGNOSIS — E782 Mixed hyperlipidemia: Secondary | ICD-10-CM | POA: Diagnosis not present

## 2018-01-10 DIAGNOSIS — S90861A Insect bite (nonvenomous), right foot, initial encounter: Secondary | ICD-10-CM | POA: Diagnosis not present

## 2018-01-10 DIAGNOSIS — I10 Essential (primary) hypertension: Secondary | ICD-10-CM | POA: Diagnosis not present

## 2018-01-10 DIAGNOSIS — E119 Type 2 diabetes mellitus without complications: Secondary | ICD-10-CM | POA: Diagnosis not present

## 2018-01-10 DIAGNOSIS — G4730 Sleep apnea, unspecified: Secondary | ICD-10-CM | POA: Diagnosis not present

## 2018-01-17 DIAGNOSIS — E119 Type 2 diabetes mellitus without complications: Secondary | ICD-10-CM | POA: Diagnosis not present

## 2018-01-17 DIAGNOSIS — Z23 Encounter for immunization: Secondary | ICD-10-CM | POA: Diagnosis not present

## 2018-01-17 DIAGNOSIS — G4730 Sleep apnea, unspecified: Secondary | ICD-10-CM | POA: Diagnosis not present

## 2018-01-17 DIAGNOSIS — E782 Mixed hyperlipidemia: Secondary | ICD-10-CM | POA: Diagnosis not present

## 2018-01-17 DIAGNOSIS — I10 Essential (primary) hypertension: Secondary | ICD-10-CM | POA: Diagnosis not present

## 2018-01-20 DIAGNOSIS — G894 Chronic pain syndrome: Secondary | ICD-10-CM | POA: Diagnosis not present

## 2018-01-20 DIAGNOSIS — F319 Bipolar disorder, unspecified: Secondary | ICD-10-CM | POA: Diagnosis not present

## 2018-01-20 DIAGNOSIS — F1120 Opioid dependence, uncomplicated: Secondary | ICD-10-CM | POA: Diagnosis not present

## 2018-01-20 DIAGNOSIS — E669 Obesity, unspecified: Secondary | ICD-10-CM | POA: Diagnosis not present

## 2018-01-20 DIAGNOSIS — M5441 Lumbago with sciatica, right side: Secondary | ICD-10-CM | POA: Diagnosis not present

## 2018-01-20 DIAGNOSIS — F419 Anxiety disorder, unspecified: Secondary | ICD-10-CM | POA: Diagnosis not present

## 2018-01-20 DIAGNOSIS — M545 Low back pain: Secondary | ICD-10-CM | POA: Diagnosis not present

## 2018-01-20 DIAGNOSIS — Z79899 Other long term (current) drug therapy: Secondary | ICD-10-CM | POA: Diagnosis not present

## 2018-02-17 DIAGNOSIS — E782 Mixed hyperlipidemia: Secondary | ICD-10-CM | POA: Diagnosis not present

## 2018-02-17 DIAGNOSIS — I10 Essential (primary) hypertension: Secondary | ICD-10-CM | POA: Diagnosis not present

## 2018-02-17 DIAGNOSIS — E118 Type 2 diabetes mellitus with unspecified complications: Secondary | ICD-10-CM | POA: Diagnosis not present

## 2018-02-17 DIAGNOSIS — E119 Type 2 diabetes mellitus without complications: Secondary | ICD-10-CM | POA: Diagnosis not present

## 2018-02-17 DIAGNOSIS — G4730 Sleep apnea, unspecified: Secondary | ICD-10-CM | POA: Diagnosis not present

## 2018-02-21 DIAGNOSIS — I4891 Unspecified atrial fibrillation: Secondary | ICD-10-CM | POA: Diagnosis not present

## 2018-02-21 DIAGNOSIS — R0602 Shortness of breath: Secondary | ICD-10-CM | POA: Diagnosis not present

## 2018-02-21 DIAGNOSIS — E782 Mixed hyperlipidemia: Secondary | ICD-10-CM | POA: Diagnosis not present

## 2018-02-21 DIAGNOSIS — G4730 Sleep apnea, unspecified: Secondary | ICD-10-CM | POA: Diagnosis not present

## 2018-02-21 DIAGNOSIS — I350 Nonrheumatic aortic (valve) stenosis: Secondary | ICD-10-CM | POA: Diagnosis not present

## 2018-02-21 DIAGNOSIS — I10 Essential (primary) hypertension: Secondary | ICD-10-CM | POA: Diagnosis not present

## 2018-03-03 DIAGNOSIS — G894 Chronic pain syndrome: Secondary | ICD-10-CM | POA: Diagnosis not present

## 2018-03-03 DIAGNOSIS — M5441 Lumbago with sciatica, right side: Secondary | ICD-10-CM | POA: Diagnosis not present

## 2018-03-03 DIAGNOSIS — Z79899 Other long term (current) drug therapy: Secondary | ICD-10-CM | POA: Diagnosis not present

## 2018-03-03 DIAGNOSIS — F319 Bipolar disorder, unspecified: Secondary | ICD-10-CM | POA: Diagnosis not present

## 2018-03-03 DIAGNOSIS — F1120 Opioid dependence, uncomplicated: Secondary | ICD-10-CM | POA: Diagnosis not present

## 2018-03-03 DIAGNOSIS — M545 Low back pain: Secondary | ICD-10-CM | POA: Diagnosis not present

## 2018-03-03 DIAGNOSIS — F419 Anxiety disorder, unspecified: Secondary | ICD-10-CM | POA: Diagnosis not present

## 2018-03-03 DIAGNOSIS — E669 Obesity, unspecified: Secondary | ICD-10-CM | POA: Diagnosis not present

## 2018-03-06 DIAGNOSIS — G4733 Obstructive sleep apnea (adult) (pediatric): Secondary | ICD-10-CM | POA: Diagnosis not present

## 2018-03-07 DIAGNOSIS — E119 Type 2 diabetes mellitus without complications: Secondary | ICD-10-CM | POA: Diagnosis not present

## 2018-03-07 DIAGNOSIS — E782 Mixed hyperlipidemia: Secondary | ICD-10-CM | POA: Diagnosis not present

## 2018-03-11 DIAGNOSIS — G4730 Sleep apnea, unspecified: Secondary | ICD-10-CM | POA: Diagnosis not present

## 2018-03-11 DIAGNOSIS — E782 Mixed hyperlipidemia: Secondary | ICD-10-CM | POA: Diagnosis not present

## 2018-03-11 DIAGNOSIS — E1122 Type 2 diabetes mellitus with diabetic chronic kidney disease: Secondary | ICD-10-CM | POA: Diagnosis not present

## 2018-03-11 DIAGNOSIS — I10 Essential (primary) hypertension: Secondary | ICD-10-CM | POA: Diagnosis not present

## 2018-03-11 DIAGNOSIS — G5601 Carpal tunnel syndrome, right upper limb: Secondary | ICD-10-CM | POA: Diagnosis not present

## 2018-03-26 DIAGNOSIS — F17210 Nicotine dependence, cigarettes, uncomplicated: Secondary | ICD-10-CM | POA: Insufficient documentation

## 2018-03-26 DIAGNOSIS — E785 Hyperlipidemia, unspecified: Secondary | ICD-10-CM | POA: Insufficient documentation

## 2018-04-01 DIAGNOSIS — G894 Chronic pain syndrome: Secondary | ICD-10-CM | POA: Diagnosis not present

## 2018-04-01 DIAGNOSIS — M5441 Lumbago with sciatica, right side: Secondary | ICD-10-CM | POA: Diagnosis not present

## 2018-04-01 DIAGNOSIS — M545 Low back pain: Secondary | ICD-10-CM | POA: Diagnosis not present

## 2018-04-01 DIAGNOSIS — F419 Anxiety disorder, unspecified: Secondary | ICD-10-CM | POA: Diagnosis not present

## 2018-04-01 DIAGNOSIS — Z79899 Other long term (current) drug therapy: Secondary | ICD-10-CM | POA: Diagnosis not present

## 2018-04-01 DIAGNOSIS — F319 Bipolar disorder, unspecified: Secondary | ICD-10-CM | POA: Diagnosis not present

## 2018-04-01 DIAGNOSIS — E669 Obesity, unspecified: Secondary | ICD-10-CM | POA: Diagnosis not present

## 2018-04-01 DIAGNOSIS — F1120 Opioid dependence, uncomplicated: Secondary | ICD-10-CM | POA: Diagnosis not present

## 2018-04-07 DIAGNOSIS — G4730 Sleep apnea, unspecified: Secondary | ICD-10-CM | POA: Diagnosis not present

## 2018-04-07 DIAGNOSIS — J209 Acute bronchitis, unspecified: Secondary | ICD-10-CM | POA: Diagnosis not present

## 2018-04-07 DIAGNOSIS — Z1331 Encounter for screening for depression: Secondary | ICD-10-CM | POA: Diagnosis not present

## 2018-04-07 DIAGNOSIS — R05 Cough: Secondary | ICD-10-CM | POA: Diagnosis not present

## 2018-04-07 DIAGNOSIS — I10 Essential (primary) hypertension: Secondary | ICD-10-CM | POA: Diagnosis not present

## 2018-04-07 DIAGNOSIS — E782 Mixed hyperlipidemia: Secondary | ICD-10-CM | POA: Diagnosis not present

## 2018-04-07 DIAGNOSIS — E1122 Type 2 diabetes mellitus with diabetic chronic kidney disease: Secondary | ICD-10-CM | POA: Diagnosis not present

## 2018-04-15 DIAGNOSIS — I10 Essential (primary) hypertension: Secondary | ICD-10-CM | POA: Diagnosis not present

## 2018-04-15 DIAGNOSIS — E119 Type 2 diabetes mellitus without complications: Secondary | ICD-10-CM | POA: Diagnosis not present

## 2018-04-15 DIAGNOSIS — G4730 Sleep apnea, unspecified: Secondary | ICD-10-CM | POA: Diagnosis not present

## 2018-04-15 DIAGNOSIS — E782 Mixed hyperlipidemia: Secondary | ICD-10-CM | POA: Diagnosis not present

## 2018-05-12 ENCOUNTER — Other Ambulatory Visit: Payer: Self-pay | Admitting: Internal Medicine

## 2018-05-12 DIAGNOSIS — Z1231 Encounter for screening mammogram for malignant neoplasm of breast: Secondary | ICD-10-CM

## 2018-05-26 DIAGNOSIS — F419 Anxiety disorder, unspecified: Secondary | ICD-10-CM | POA: Diagnosis not present

## 2018-05-26 DIAGNOSIS — F319 Bipolar disorder, unspecified: Secondary | ICD-10-CM | POA: Diagnosis not present

## 2018-05-26 DIAGNOSIS — Z79899 Other long term (current) drug therapy: Secondary | ICD-10-CM | POA: Diagnosis not present

## 2018-05-26 DIAGNOSIS — M5441 Lumbago with sciatica, right side: Secondary | ICD-10-CM | POA: Diagnosis not present

## 2018-05-26 DIAGNOSIS — F1120 Opioid dependence, uncomplicated: Secondary | ICD-10-CM | POA: Diagnosis not present

## 2018-05-26 DIAGNOSIS — E669 Obesity, unspecified: Secondary | ICD-10-CM | POA: Diagnosis not present

## 2018-05-26 DIAGNOSIS — G894 Chronic pain syndrome: Secondary | ICD-10-CM | POA: Diagnosis not present

## 2018-05-27 DIAGNOSIS — L03115 Cellulitis of right lower limb: Secondary | ICD-10-CM | POA: Diagnosis not present

## 2018-05-27 DIAGNOSIS — I10 Essential (primary) hypertension: Secondary | ICD-10-CM | POA: Diagnosis not present

## 2018-05-27 DIAGNOSIS — R609 Edema, unspecified: Secondary | ICD-10-CM | POA: Diagnosis not present

## 2018-05-27 DIAGNOSIS — G4730 Sleep apnea, unspecified: Secondary | ICD-10-CM | POA: Diagnosis not present

## 2018-05-27 DIAGNOSIS — I4891 Unspecified atrial fibrillation: Secondary | ICD-10-CM | POA: Diagnosis not present

## 2018-05-27 DIAGNOSIS — I350 Nonrheumatic aortic (valve) stenosis: Secondary | ICD-10-CM | POA: Diagnosis not present

## 2018-05-27 DIAGNOSIS — R0602 Shortness of breath: Secondary | ICD-10-CM | POA: Diagnosis not present

## 2018-05-27 DIAGNOSIS — E782 Mixed hyperlipidemia: Secondary | ICD-10-CM | POA: Diagnosis not present

## 2018-05-27 DIAGNOSIS — E1122 Type 2 diabetes mellitus with diabetic chronic kidney disease: Secondary | ICD-10-CM | POA: Diagnosis not present

## 2018-05-27 DIAGNOSIS — I420 Dilated cardiomyopathy: Secondary | ICD-10-CM | POA: Diagnosis not present

## 2018-06-03 DIAGNOSIS — E1122 Type 2 diabetes mellitus with diabetic chronic kidney disease: Secondary | ICD-10-CM | POA: Diagnosis not present

## 2018-06-03 DIAGNOSIS — L03115 Cellulitis of right lower limb: Secondary | ICD-10-CM | POA: Diagnosis not present

## 2018-06-03 DIAGNOSIS — E782 Mixed hyperlipidemia: Secondary | ICD-10-CM | POA: Diagnosis not present

## 2018-06-03 DIAGNOSIS — I10 Essential (primary) hypertension: Secondary | ICD-10-CM | POA: Diagnosis not present

## 2018-06-03 DIAGNOSIS — G4701 Insomnia due to medical condition: Secondary | ICD-10-CM | POA: Diagnosis not present

## 2018-06-03 DIAGNOSIS — T1490XD Injury, unspecified, subsequent encounter: Secondary | ICD-10-CM | POA: Diagnosis not present

## 2018-06-09 DIAGNOSIS — G4733 Obstructive sleep apnea (adult) (pediatric): Secondary | ICD-10-CM | POA: Diagnosis not present

## 2018-06-17 DIAGNOSIS — E039 Hypothyroidism, unspecified: Secondary | ICD-10-CM | POA: Diagnosis not present

## 2018-06-17 DIAGNOSIS — I2510 Atherosclerotic heart disease of native coronary artery without angina pectoris: Secondary | ICD-10-CM | POA: Diagnosis not present

## 2018-06-17 DIAGNOSIS — I10 Essential (primary) hypertension: Secondary | ICD-10-CM | POA: Diagnosis not present

## 2018-06-17 DIAGNOSIS — E782 Mixed hyperlipidemia: Secondary | ICD-10-CM | POA: Diagnosis not present

## 2018-06-17 DIAGNOSIS — G4730 Sleep apnea, unspecified: Secondary | ICD-10-CM | POA: Diagnosis not present

## 2018-06-17 DIAGNOSIS — E119 Type 2 diabetes mellitus without complications: Secondary | ICD-10-CM | POA: Diagnosis not present

## 2018-06-23 DIAGNOSIS — E669 Obesity, unspecified: Secondary | ICD-10-CM | POA: Diagnosis not present

## 2018-06-23 DIAGNOSIS — F419 Anxiety disorder, unspecified: Secondary | ICD-10-CM | POA: Diagnosis not present

## 2018-06-23 DIAGNOSIS — F1120 Opioid dependence, uncomplicated: Secondary | ICD-10-CM | POA: Diagnosis not present

## 2018-06-23 DIAGNOSIS — G894 Chronic pain syndrome: Secondary | ICD-10-CM | POA: Diagnosis not present

## 2018-06-23 DIAGNOSIS — M5441 Lumbago with sciatica, right side: Secondary | ICD-10-CM | POA: Diagnosis not present

## 2018-06-23 DIAGNOSIS — F319 Bipolar disorder, unspecified: Secondary | ICD-10-CM | POA: Diagnosis not present

## 2018-06-30 DIAGNOSIS — G4730 Sleep apnea, unspecified: Secondary | ICD-10-CM | POA: Diagnosis not present

## 2018-06-30 DIAGNOSIS — E782 Mixed hyperlipidemia: Secondary | ICD-10-CM | POA: Diagnosis not present

## 2018-06-30 DIAGNOSIS — R410 Disorientation, unspecified: Secondary | ICD-10-CM | POA: Diagnosis not present

## 2018-06-30 DIAGNOSIS — Z209 Contact with and (suspected) exposure to unspecified communicable disease: Secondary | ICD-10-CM | POA: Diagnosis not present

## 2018-06-30 DIAGNOSIS — R0602 Shortness of breath: Secondary | ICD-10-CM | POA: Diagnosis not present

## 2018-06-30 DIAGNOSIS — I350 Nonrheumatic aortic (valve) stenosis: Secondary | ICD-10-CM | POA: Diagnosis not present

## 2018-06-30 DIAGNOSIS — D72829 Elevated white blood cell count, unspecified: Secondary | ICD-10-CM | POA: Diagnosis not present

## 2018-06-30 DIAGNOSIS — I2510 Atherosclerotic heart disease of native coronary artery without angina pectoris: Secondary | ICD-10-CM | POA: Diagnosis not present

## 2018-06-30 DIAGNOSIS — R609 Edema, unspecified: Secondary | ICD-10-CM | POA: Diagnosis not present

## 2018-06-30 DIAGNOSIS — I8311 Varicose veins of right lower extremity with inflammation: Secondary | ICD-10-CM | POA: Diagnosis not present

## 2018-06-30 DIAGNOSIS — R404 Transient alteration of awareness: Secondary | ICD-10-CM | POA: Diagnosis not present

## 2018-06-30 DIAGNOSIS — I4891 Unspecified atrial fibrillation: Secondary | ICD-10-CM | POA: Diagnosis not present

## 2018-06-30 DIAGNOSIS — E1122 Type 2 diabetes mellitus with diabetic chronic kidney disease: Secondary | ICD-10-CM | POA: Diagnosis not present

## 2018-06-30 DIAGNOSIS — I10 Essential (primary) hypertension: Secondary | ICD-10-CM | POA: Diagnosis not present

## 2018-06-30 DIAGNOSIS — I499 Cardiac arrhythmia, unspecified: Secondary | ICD-10-CM | POA: Diagnosis not present

## 2018-06-30 DIAGNOSIS — I8312 Varicose veins of left lower extremity with inflammation: Secondary | ICD-10-CM | POA: Diagnosis not present

## 2018-06-30 DIAGNOSIS — I420 Dilated cardiomyopathy: Secondary | ICD-10-CM | POA: Diagnosis not present

## 2018-06-30 DIAGNOSIS — R079 Chest pain, unspecified: Secondary | ICD-10-CM | POA: Diagnosis not present

## 2018-07-01 DIAGNOSIS — K219 Gastro-esophageal reflux disease without esophagitis: Secondary | ICD-10-CM | POA: Diagnosis not present

## 2018-07-01 DIAGNOSIS — E559 Vitamin D deficiency, unspecified: Secondary | ICD-10-CM | POA: Diagnosis not present

## 2018-07-01 DIAGNOSIS — E7849 Other hyperlipidemia: Secondary | ICD-10-CM | POA: Diagnosis not present

## 2018-07-01 DIAGNOSIS — J418 Mixed simple and mucopurulent chronic bronchitis: Secondary | ICD-10-CM | POA: Diagnosis not present

## 2018-07-01 DIAGNOSIS — D72829 Elevated white blood cell count, unspecified: Secondary | ICD-10-CM | POA: Diagnosis not present

## 2018-07-01 DIAGNOSIS — Z23 Encounter for immunization: Secondary | ICD-10-CM | POA: Diagnosis not present

## 2018-07-01 DIAGNOSIS — E1122 Type 2 diabetes mellitus with diabetic chronic kidney disease: Secondary | ICD-10-CM | POA: Diagnosis not present

## 2018-07-01 DIAGNOSIS — E039 Hypothyroidism, unspecified: Secondary | ICD-10-CM | POA: Diagnosis not present

## 2018-07-14 DIAGNOSIS — I10 Essential (primary) hypertension: Secondary | ICD-10-CM | POA: Diagnosis not present

## 2018-07-14 DIAGNOSIS — E782 Mixed hyperlipidemia: Secondary | ICD-10-CM | POA: Diagnosis not present

## 2018-07-14 DIAGNOSIS — E1122 Type 2 diabetes mellitus with diabetic chronic kidney disease: Secondary | ICD-10-CM | POA: Diagnosis not present

## 2018-07-14 DIAGNOSIS — G4730 Sleep apnea, unspecified: Secondary | ICD-10-CM | POA: Diagnosis not present

## 2018-07-21 DIAGNOSIS — E669 Obesity, unspecified: Secondary | ICD-10-CM | POA: Diagnosis not present

## 2018-07-21 DIAGNOSIS — Z79899 Other long term (current) drug therapy: Secondary | ICD-10-CM | POA: Diagnosis not present

## 2018-07-21 DIAGNOSIS — G894 Chronic pain syndrome: Secondary | ICD-10-CM | POA: Diagnosis not present

## 2018-07-21 DIAGNOSIS — F319 Bipolar disorder, unspecified: Secondary | ICD-10-CM | POA: Diagnosis not present

## 2018-07-21 DIAGNOSIS — F1120 Opioid dependence, uncomplicated: Secondary | ICD-10-CM | POA: Diagnosis not present

## 2018-07-21 DIAGNOSIS — M545 Low back pain: Secondary | ICD-10-CM | POA: Diagnosis not present

## 2018-07-21 DIAGNOSIS — F419 Anxiety disorder, unspecified: Secondary | ICD-10-CM | POA: Diagnosis not present

## 2018-07-21 DIAGNOSIS — M5441 Lumbago with sciatica, right side: Secondary | ICD-10-CM | POA: Diagnosis not present

## 2018-07-31 ENCOUNTER — Encounter: Payer: Self-pay | Admitting: Psychiatry

## 2018-07-31 ENCOUNTER — Ambulatory Visit (INDEPENDENT_AMBULATORY_CARE_PROVIDER_SITE_OTHER): Payer: PPO | Admitting: Psychiatry

## 2018-07-31 ENCOUNTER — Other Ambulatory Visit: Payer: Self-pay

## 2018-07-31 DIAGNOSIS — Z5329 Procedure and treatment not carried out because of patient's decision for other reasons: Secondary | ICD-10-CM
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
## 2018-09-01 ENCOUNTER — Ambulatory Visit: Payer: PPO | Admitting: Psychiatry

## 2018-09-01 ENCOUNTER — Other Ambulatory Visit: Payer: Self-pay

## 2018-09-01 DIAGNOSIS — G894 Chronic pain syndrome: Secondary | ICD-10-CM | POA: Diagnosis not present

## 2018-09-01 DIAGNOSIS — E669 Obesity, unspecified: Secondary | ICD-10-CM | POA: Diagnosis not present

## 2018-09-01 DIAGNOSIS — F1120 Opioid dependence, uncomplicated: Secondary | ICD-10-CM | POA: Diagnosis not present

## 2018-09-01 DIAGNOSIS — M5441 Lumbago with sciatica, right side: Secondary | ICD-10-CM | POA: Diagnosis not present

## 2018-09-01 DIAGNOSIS — F419 Anxiety disorder, unspecified: Secondary | ICD-10-CM | POA: Diagnosis not present

## 2018-09-01 DIAGNOSIS — Z79899 Other long term (current) drug therapy: Secondary | ICD-10-CM | POA: Diagnosis not present

## 2018-09-01 DIAGNOSIS — M545 Low back pain: Secondary | ICD-10-CM | POA: Diagnosis not present

## 2018-09-01 DIAGNOSIS — F319 Bipolar disorder, unspecified: Secondary | ICD-10-CM | POA: Diagnosis not present

## 2018-09-05 DIAGNOSIS — G4733 Obstructive sleep apnea (adult) (pediatric): Secondary | ICD-10-CM | POA: Diagnosis not present

## 2018-09-09 ENCOUNTER — Ambulatory Visit
Admission: RE | Admit: 2018-09-09 | Discharge: 2018-09-09 | Disposition: A | Discharge status: Home / Self Care | Payer: PPO | Source: Ambulatory Visit | Attending: Internal Medicine | Admitting: Internal Medicine

## 2018-09-09 ENCOUNTER — Other Ambulatory Visit: Payer: Self-pay

## 2018-09-09 DIAGNOSIS — Z1231 Encounter for screening mammogram for malignant neoplasm of breast: Secondary | ICD-10-CM | POA: Diagnosis not present

## 2018-09-15 DIAGNOSIS — I4891 Unspecified atrial fibrillation: Secondary | ICD-10-CM | POA: Diagnosis not present

## 2018-09-15 DIAGNOSIS — I10 Essential (primary) hypertension: Secondary | ICD-10-CM | POA: Diagnosis not present

## 2018-09-15 DIAGNOSIS — E1165 Type 2 diabetes mellitus with hyperglycemia: Secondary | ICD-10-CM | POA: Diagnosis not present

## 2018-09-15 DIAGNOSIS — E782 Mixed hyperlipidemia: Secondary | ICD-10-CM | POA: Diagnosis not present

## 2018-09-15 DIAGNOSIS — E1140 Type 2 diabetes mellitus with diabetic neuropathy, unspecified: Secondary | ICD-10-CM | POA: Diagnosis not present

## 2018-09-15 DIAGNOSIS — E1340 Other specified diabetes mellitus with diabetic neuropathy, unspecified: Secondary | ICD-10-CM | POA: Diagnosis not present

## 2018-09-22 DIAGNOSIS — I10 Essential (primary) hypertension: Secondary | ICD-10-CM | POA: Diagnosis not present

## 2018-09-22 DIAGNOSIS — E782 Mixed hyperlipidemia: Secondary | ICD-10-CM | POA: Diagnosis not present

## 2018-09-22 DIAGNOSIS — I4891 Unspecified atrial fibrillation: Secondary | ICD-10-CM | POA: Diagnosis not present

## 2018-09-22 DIAGNOSIS — E1140 Type 2 diabetes mellitus with diabetic neuropathy, unspecified: Secondary | ICD-10-CM | POA: Diagnosis not present

## 2018-09-29 DIAGNOSIS — I350 Nonrheumatic aortic (valve) stenosis: Secondary | ICD-10-CM | POA: Diagnosis not present

## 2018-09-29 DIAGNOSIS — I4891 Unspecified atrial fibrillation: Secondary | ICD-10-CM | POA: Diagnosis not present

## 2018-09-29 DIAGNOSIS — I420 Dilated cardiomyopathy: Secondary | ICD-10-CM | POA: Diagnosis not present

## 2018-09-29 DIAGNOSIS — D588 Other specified hereditary hemolytic anemias: Secondary | ICD-10-CM | POA: Diagnosis not present

## 2018-09-29 DIAGNOSIS — E782 Mixed hyperlipidemia: Secondary | ICD-10-CM | POA: Diagnosis not present

## 2018-09-29 DIAGNOSIS — I10 Essential (primary) hypertension: Secondary | ICD-10-CM | POA: Diagnosis not present

## 2018-09-29 DIAGNOSIS — R0602 Shortness of breath: Secondary | ICD-10-CM | POA: Diagnosis not present

## 2018-10-06 DIAGNOSIS — F1120 Opioid dependence, uncomplicated: Secondary | ICD-10-CM | POA: Diagnosis not present

## 2018-10-06 DIAGNOSIS — E669 Obesity, unspecified: Secondary | ICD-10-CM | POA: Diagnosis not present

## 2018-10-06 DIAGNOSIS — F419 Anxiety disorder, unspecified: Secondary | ICD-10-CM | POA: Diagnosis not present

## 2018-10-06 DIAGNOSIS — F319 Bipolar disorder, unspecified: Secondary | ICD-10-CM | POA: Diagnosis not present

## 2018-10-06 DIAGNOSIS — Z79899 Other long term (current) drug therapy: Secondary | ICD-10-CM | POA: Diagnosis not present

## 2018-10-06 DIAGNOSIS — G8929 Other chronic pain: Secondary | ICD-10-CM | POA: Diagnosis not present

## 2018-10-06 DIAGNOSIS — M5441 Lumbago with sciatica, right side: Secondary | ICD-10-CM | POA: Diagnosis not present

## 2018-10-06 DIAGNOSIS — G894 Chronic pain syndrome: Secondary | ICD-10-CM | POA: Diagnosis not present

## 2018-10-06 DIAGNOSIS — M545 Low back pain: Secondary | ICD-10-CM | POA: Diagnosis not present

## 2018-10-09 DIAGNOSIS — I10 Essential (primary) hypertension: Secondary | ICD-10-CM | POA: Diagnosis not present

## 2018-10-09 DIAGNOSIS — I420 Dilated cardiomyopathy: Secondary | ICD-10-CM | POA: Diagnosis not present

## 2018-10-09 DIAGNOSIS — I739 Peripheral vascular disease, unspecified: Secondary | ICD-10-CM | POA: Diagnosis not present

## 2018-10-09 DIAGNOSIS — I480 Paroxysmal atrial fibrillation: Secondary | ICD-10-CM | POA: Diagnosis not present

## 2018-10-09 DIAGNOSIS — G4730 Sleep apnea, unspecified: Secondary | ICD-10-CM | POA: Diagnosis not present

## 2018-10-09 DIAGNOSIS — E782 Mixed hyperlipidemia: Secondary | ICD-10-CM | POA: Diagnosis not present

## 2018-10-13 DIAGNOSIS — I10 Essential (primary) hypertension: Secondary | ICD-10-CM | POA: Diagnosis not present

## 2018-10-13 DIAGNOSIS — E559 Vitamin D deficiency, unspecified: Secondary | ICD-10-CM | POA: Diagnosis not present

## 2018-10-13 DIAGNOSIS — E1122 Type 2 diabetes mellitus with diabetic chronic kidney disease: Secondary | ICD-10-CM | POA: Diagnosis not present

## 2018-10-13 DIAGNOSIS — E782 Mixed hyperlipidemia: Secondary | ICD-10-CM | POA: Diagnosis not present

## 2018-10-13 DIAGNOSIS — I2510 Atherosclerotic heart disease of native coronary artery without angina pectoris: Secondary | ICD-10-CM | POA: Diagnosis not present

## 2018-10-13 DIAGNOSIS — I739 Peripheral vascular disease, unspecified: Secondary | ICD-10-CM | POA: Diagnosis not present

## 2018-10-20 ENCOUNTER — Ambulatory Visit: Payer: PPO | Admitting: Psychiatry

## 2018-10-20 ENCOUNTER — Other Ambulatory Visit: Payer: Self-pay

## 2018-10-24 DIAGNOSIS — I739 Peripheral vascular disease, unspecified: Secondary | ICD-10-CM | POA: Diagnosis not present

## 2018-10-31 DIAGNOSIS — E119 Type 2 diabetes mellitus without complications: Secondary | ICD-10-CM | POA: Diagnosis not present

## 2018-11-04 DIAGNOSIS — Z794 Long term (current) use of insulin: Secondary | ICD-10-CM | POA: Insufficient documentation

## 2018-11-04 HISTORY — DX: Long term (current) use of insulin: Z79.4

## 2018-11-10 DIAGNOSIS — E669 Obesity, unspecified: Secondary | ICD-10-CM | POA: Diagnosis not present

## 2018-11-10 DIAGNOSIS — G894 Chronic pain syndrome: Secondary | ICD-10-CM | POA: Diagnosis not present

## 2018-11-10 DIAGNOSIS — F319 Bipolar disorder, unspecified: Secondary | ICD-10-CM | POA: Diagnosis not present

## 2018-11-10 DIAGNOSIS — F419 Anxiety disorder, unspecified: Secondary | ICD-10-CM | POA: Diagnosis not present

## 2018-11-10 DIAGNOSIS — M545 Low back pain: Secondary | ICD-10-CM | POA: Diagnosis not present

## 2018-11-10 DIAGNOSIS — F1120 Opioid dependence, uncomplicated: Secondary | ICD-10-CM | POA: Diagnosis not present

## 2018-11-10 DIAGNOSIS — M5441 Lumbago with sciatica, right side: Secondary | ICD-10-CM | POA: Diagnosis not present

## 2018-11-26 ENCOUNTER — Other Ambulatory Visit: Payer: Self-pay

## 2018-11-26 ENCOUNTER — Ambulatory Visit: Payer: PPO | Admitting: Psychiatry

## 2018-12-08 DIAGNOSIS — F319 Bipolar disorder, unspecified: Secondary | ICD-10-CM | POA: Diagnosis not present

## 2018-12-08 DIAGNOSIS — G894 Chronic pain syndrome: Secondary | ICD-10-CM | POA: Diagnosis not present

## 2018-12-08 DIAGNOSIS — F419 Anxiety disorder, unspecified: Secondary | ICD-10-CM | POA: Diagnosis not present

## 2018-12-08 DIAGNOSIS — G4733 Obstructive sleep apnea (adult) (pediatric): Secondary | ICD-10-CM | POA: Diagnosis not present

## 2018-12-08 DIAGNOSIS — M5441 Lumbago with sciatica, right side: Secondary | ICD-10-CM | POA: Diagnosis not present

## 2018-12-08 DIAGNOSIS — M545 Low back pain: Secondary | ICD-10-CM | POA: Diagnosis not present

## 2018-12-08 DIAGNOSIS — E669 Obesity, unspecified: Secondary | ICD-10-CM | POA: Diagnosis not present

## 2018-12-08 DIAGNOSIS — F1120 Opioid dependence, uncomplicated: Secondary | ICD-10-CM | POA: Diagnosis not present

## 2018-12-08 DIAGNOSIS — Z79899 Other long term (current) drug therapy: Secondary | ICD-10-CM | POA: Diagnosis not present

## 2018-12-24 DIAGNOSIS — I10 Essential (primary) hypertension: Secondary | ICD-10-CM | POA: Diagnosis not present

## 2018-12-24 DIAGNOSIS — E1122 Type 2 diabetes mellitus with diabetic chronic kidney disease: Secondary | ICD-10-CM | POA: Diagnosis not present

## 2018-12-24 DIAGNOSIS — I2510 Atherosclerotic heart disease of native coronary artery without angina pectoris: Secondary | ICD-10-CM | POA: Diagnosis not present

## 2018-12-24 DIAGNOSIS — E782 Mixed hyperlipidemia: Secondary | ICD-10-CM | POA: Diagnosis not present

## 2018-12-24 DIAGNOSIS — E559 Vitamin D deficiency, unspecified: Secondary | ICD-10-CM | POA: Diagnosis not present

## 2019-01-05 DIAGNOSIS — F419 Anxiety disorder, unspecified: Secondary | ICD-10-CM | POA: Diagnosis not present

## 2019-01-05 DIAGNOSIS — F1120 Opioid dependence, uncomplicated: Secondary | ICD-10-CM | POA: Diagnosis not present

## 2019-01-05 DIAGNOSIS — G894 Chronic pain syndrome: Secondary | ICD-10-CM | POA: Diagnosis not present

## 2019-01-05 DIAGNOSIS — M545 Low back pain: Secondary | ICD-10-CM | POA: Diagnosis not present

## 2019-01-05 DIAGNOSIS — Z79899 Other long term (current) drug therapy: Secondary | ICD-10-CM | POA: Diagnosis not present

## 2019-01-05 DIAGNOSIS — F319 Bipolar disorder, unspecified: Secondary | ICD-10-CM | POA: Diagnosis not present

## 2019-01-05 DIAGNOSIS — E669 Obesity, unspecified: Secondary | ICD-10-CM | POA: Diagnosis not present

## 2019-01-05 DIAGNOSIS — M5441 Lumbago with sciatica, right side: Secondary | ICD-10-CM | POA: Diagnosis not present

## 2019-01-09 DIAGNOSIS — I420 Dilated cardiomyopathy: Secondary | ICD-10-CM | POA: Diagnosis not present

## 2019-01-09 DIAGNOSIS — I350 Nonrheumatic aortic (valve) stenosis: Secondary | ICD-10-CM | POA: Diagnosis not present

## 2019-01-09 DIAGNOSIS — I10 Essential (primary) hypertension: Secondary | ICD-10-CM | POA: Diagnosis not present

## 2019-01-09 DIAGNOSIS — D588 Other specified hereditary hemolytic anemias: Secondary | ICD-10-CM | POA: Diagnosis not present

## 2019-01-09 DIAGNOSIS — I480 Paroxysmal atrial fibrillation: Secondary | ICD-10-CM | POA: Insufficient documentation

## 2019-01-09 DIAGNOSIS — R0602 Shortness of breath: Secondary | ICD-10-CM | POA: Diagnosis not present

## 2019-01-09 DIAGNOSIS — I060 Rheumatic aortic stenosis: Secondary | ICD-10-CM | POA: Insufficient documentation

## 2019-01-09 DIAGNOSIS — I119 Hypertensive heart disease without heart failure: Secondary | ICD-10-CM | POA: Insufficient documentation

## 2019-01-09 DIAGNOSIS — G4730 Sleep apnea, unspecified: Secondary | ICD-10-CM | POA: Insufficient documentation

## 2019-01-09 DIAGNOSIS — I4891 Unspecified atrial fibrillation: Secondary | ICD-10-CM | POA: Diagnosis not present

## 2019-01-09 DIAGNOSIS — E782 Mixed hyperlipidemia: Secondary | ICD-10-CM | POA: Diagnosis not present

## 2019-01-09 DIAGNOSIS — E1151 Type 2 diabetes mellitus with diabetic peripheral angiopathy without gangrene: Secondary | ICD-10-CM | POA: Insufficient documentation

## 2019-01-09 DIAGNOSIS — I421 Obstructive hypertrophic cardiomyopathy: Secondary | ICD-10-CM | POA: Insufficient documentation

## 2019-01-09 DIAGNOSIS — F319 Bipolar disorder, unspecified: Secondary | ICD-10-CM | POA: Insufficient documentation

## 2019-01-14 DIAGNOSIS — E782 Mixed hyperlipidemia: Secondary | ICD-10-CM | POA: Diagnosis not present

## 2019-01-14 DIAGNOSIS — I10 Essential (primary) hypertension: Secondary | ICD-10-CM | POA: Diagnosis not present

## 2019-01-14 DIAGNOSIS — E1122 Type 2 diabetes mellitus with diabetic chronic kidney disease: Secondary | ICD-10-CM | POA: Diagnosis not present

## 2019-01-14 DIAGNOSIS — F5101 Primary insomnia: Secondary | ICD-10-CM | POA: Diagnosis not present

## 2019-01-14 DIAGNOSIS — E118 Type 2 diabetes mellitus with unspecified complications: Secondary | ICD-10-CM | POA: Diagnosis not present

## 2019-01-14 DIAGNOSIS — E039 Hypothyroidism, unspecified: Secondary | ICD-10-CM | POA: Diagnosis not present

## 2019-01-15 DIAGNOSIS — E785 Hyperlipidemia, unspecified: Secondary | ICD-10-CM | POA: Diagnosis not present

## 2019-01-15 DIAGNOSIS — I421 Obstructive hypertrophic cardiomyopathy: Secondary | ICD-10-CM | POA: Diagnosis not present

## 2019-01-15 DIAGNOSIS — I119 Hypertensive heart disease without heart failure: Secondary | ICD-10-CM | POA: Diagnosis not present

## 2019-01-15 DIAGNOSIS — F319 Bipolar disorder, unspecified: Secondary | ICD-10-CM | POA: Diagnosis not present

## 2019-01-15 DIAGNOSIS — G4739 Other sleep apnea: Secondary | ICD-10-CM | POA: Diagnosis not present

## 2019-01-15 DIAGNOSIS — R296 Repeated falls: Secondary | ICD-10-CM | POA: Diagnosis not present

## 2019-01-15 DIAGNOSIS — E1151 Type 2 diabetes mellitus with diabetic peripheral angiopathy without gangrene: Secondary | ICD-10-CM | POA: Diagnosis not present

## 2019-01-15 DIAGNOSIS — Z6841 Body Mass Index (BMI) 40.0 and over, adult: Secondary | ICD-10-CM | POA: Diagnosis not present

## 2019-01-15 DIAGNOSIS — E1142 Type 2 diabetes mellitus with diabetic polyneuropathy: Secondary | ICD-10-CM | POA: Diagnosis not present

## 2019-01-15 DIAGNOSIS — F17210 Nicotine dependence, cigarettes, uncomplicated: Secondary | ICD-10-CM | POA: Diagnosis not present

## 2019-01-15 DIAGNOSIS — M6281 Muscle weakness (generalized): Secondary | ICD-10-CM | POA: Diagnosis not present

## 2019-01-15 DIAGNOSIS — I480 Paroxysmal atrial fibrillation: Secondary | ICD-10-CM | POA: Diagnosis not present

## 2019-01-15 DIAGNOSIS — E668 Other obesity: Secondary | ICD-10-CM | POA: Diagnosis not present

## 2019-01-15 DIAGNOSIS — Z794 Long term (current) use of insulin: Secondary | ICD-10-CM | POA: Diagnosis not present

## 2019-01-15 DIAGNOSIS — I060 Rheumatic aortic stenosis: Secondary | ICD-10-CM | POA: Diagnosis not present

## 2019-01-28 ENCOUNTER — Ambulatory Visit: Payer: PPO | Admitting: Psychiatry

## 2019-02-09 DIAGNOSIS — F1120 Opioid dependence, uncomplicated: Secondary | ICD-10-CM | POA: Diagnosis not present

## 2019-02-09 DIAGNOSIS — F319 Bipolar disorder, unspecified: Secondary | ICD-10-CM | POA: Diagnosis not present

## 2019-02-09 DIAGNOSIS — Z79899 Other long term (current) drug therapy: Secondary | ICD-10-CM | POA: Diagnosis not present

## 2019-02-09 DIAGNOSIS — G894 Chronic pain syndrome: Secondary | ICD-10-CM | POA: Diagnosis not present

## 2019-02-09 DIAGNOSIS — M5441 Lumbago with sciatica, right side: Secondary | ICD-10-CM | POA: Diagnosis not present

## 2019-02-09 DIAGNOSIS — E669 Obesity, unspecified: Secondary | ICD-10-CM | POA: Diagnosis not present

## 2019-02-09 DIAGNOSIS — F419 Anxiety disorder, unspecified: Secondary | ICD-10-CM | POA: Diagnosis not present

## 2019-02-14 DIAGNOSIS — E1151 Type 2 diabetes mellitus with diabetic peripheral angiopathy without gangrene: Secondary | ICD-10-CM | POA: Diagnosis not present

## 2019-02-14 DIAGNOSIS — E1142 Type 2 diabetes mellitus with diabetic polyneuropathy: Secondary | ICD-10-CM | POA: Diagnosis not present

## 2019-02-14 DIAGNOSIS — F17210 Nicotine dependence, cigarettes, uncomplicated: Secondary | ICD-10-CM | POA: Diagnosis not present

## 2019-02-14 DIAGNOSIS — G4739 Other sleep apnea: Secondary | ICD-10-CM | POA: Diagnosis not present

## 2019-02-14 DIAGNOSIS — E668 Other obesity: Secondary | ICD-10-CM | POA: Diagnosis not present

## 2019-02-14 DIAGNOSIS — F319 Bipolar disorder, unspecified: Secondary | ICD-10-CM | POA: Diagnosis not present

## 2019-02-14 DIAGNOSIS — I119 Hypertensive heart disease without heart failure: Secondary | ICD-10-CM | POA: Diagnosis not present

## 2019-02-14 DIAGNOSIS — Z6841 Body Mass Index (BMI) 40.0 and over, adult: Secondary | ICD-10-CM | POA: Diagnosis not present

## 2019-02-14 DIAGNOSIS — I060 Rheumatic aortic stenosis: Secondary | ICD-10-CM | POA: Diagnosis not present

## 2019-02-14 DIAGNOSIS — E785 Hyperlipidemia, unspecified: Secondary | ICD-10-CM | POA: Diagnosis not present

## 2019-02-14 DIAGNOSIS — M6281 Muscle weakness (generalized): Secondary | ICD-10-CM | POA: Diagnosis not present

## 2019-02-14 DIAGNOSIS — R296 Repeated falls: Secondary | ICD-10-CM | POA: Diagnosis not present

## 2019-02-14 DIAGNOSIS — I480 Paroxysmal atrial fibrillation: Secondary | ICD-10-CM | POA: Diagnosis not present

## 2019-02-14 DIAGNOSIS — Z794 Long term (current) use of insulin: Secondary | ICD-10-CM | POA: Diagnosis not present

## 2019-02-14 DIAGNOSIS — I421 Obstructive hypertrophic cardiomyopathy: Secondary | ICD-10-CM | POA: Diagnosis not present

## 2019-02-16 DIAGNOSIS — F411 Generalized anxiety disorder: Secondary | ICD-10-CM | POA: Diagnosis not present

## 2019-02-16 DIAGNOSIS — I4891 Unspecified atrial fibrillation: Secondary | ICD-10-CM | POA: Diagnosis not present

## 2019-02-16 DIAGNOSIS — I10 Essential (primary) hypertension: Secondary | ICD-10-CM | POA: Diagnosis not present

## 2019-02-16 DIAGNOSIS — E782 Mixed hyperlipidemia: Secondary | ICD-10-CM | POA: Diagnosis not present

## 2019-02-16 DIAGNOSIS — E1122 Type 2 diabetes mellitus with diabetic chronic kidney disease: Secondary | ICD-10-CM | POA: Diagnosis not present

## 2019-02-16 DIAGNOSIS — G4730 Sleep apnea, unspecified: Secondary | ICD-10-CM | POA: Diagnosis not present

## 2019-02-16 DIAGNOSIS — E039 Hypothyroidism, unspecified: Secondary | ICD-10-CM | POA: Diagnosis not present

## 2019-03-10 DIAGNOSIS — G4733 Obstructive sleep apnea (adult) (pediatric): Secondary | ICD-10-CM | POA: Diagnosis not present

## 2019-03-11 DIAGNOSIS — E669 Obesity, unspecified: Secondary | ICD-10-CM | POA: Diagnosis not present

## 2019-03-11 DIAGNOSIS — Z79899 Other long term (current) drug therapy: Secondary | ICD-10-CM | POA: Diagnosis not present

## 2019-03-11 DIAGNOSIS — F419 Anxiety disorder, unspecified: Secondary | ICD-10-CM | POA: Diagnosis not present

## 2019-03-11 DIAGNOSIS — F1120 Opioid dependence, uncomplicated: Secondary | ICD-10-CM | POA: Diagnosis not present

## 2019-03-11 DIAGNOSIS — F319 Bipolar disorder, unspecified: Secondary | ICD-10-CM | POA: Diagnosis not present

## 2019-03-11 DIAGNOSIS — G894 Chronic pain syndrome: Secondary | ICD-10-CM | POA: Diagnosis not present

## 2019-03-11 DIAGNOSIS — M5441 Lumbago with sciatica, right side: Secondary | ICD-10-CM | POA: Diagnosis not present

## 2019-03-16 DIAGNOSIS — E785 Hyperlipidemia, unspecified: Secondary | ICD-10-CM | POA: Diagnosis not present

## 2019-03-16 DIAGNOSIS — I119 Hypertensive heart disease without heart failure: Secondary | ICD-10-CM | POA: Diagnosis not present

## 2019-03-16 DIAGNOSIS — F319 Bipolar disorder, unspecified: Secondary | ICD-10-CM | POA: Diagnosis not present

## 2019-03-16 DIAGNOSIS — E1151 Type 2 diabetes mellitus with diabetic peripheral angiopathy without gangrene: Secondary | ICD-10-CM | POA: Diagnosis not present

## 2019-03-16 DIAGNOSIS — Z794 Long term (current) use of insulin: Secondary | ICD-10-CM | POA: Diagnosis not present

## 2019-03-16 DIAGNOSIS — I480 Paroxysmal atrial fibrillation: Secondary | ICD-10-CM | POA: Diagnosis not present

## 2019-03-16 DIAGNOSIS — Z6841 Body Mass Index (BMI) 40.0 and over, adult: Secondary | ICD-10-CM | POA: Diagnosis not present

## 2019-03-16 DIAGNOSIS — I060 Rheumatic aortic stenosis: Secondary | ICD-10-CM | POA: Diagnosis not present

## 2019-03-16 DIAGNOSIS — G4739 Other sleep apnea: Secondary | ICD-10-CM | POA: Diagnosis not present

## 2019-03-16 DIAGNOSIS — E668 Other obesity: Secondary | ICD-10-CM | POA: Diagnosis not present

## 2019-03-16 DIAGNOSIS — F17210 Nicotine dependence, cigarettes, uncomplicated: Secondary | ICD-10-CM | POA: Diagnosis not present

## 2019-03-16 DIAGNOSIS — E1142 Type 2 diabetes mellitus with diabetic polyneuropathy: Secondary | ICD-10-CM | POA: Diagnosis not present

## 2019-03-16 DIAGNOSIS — I421 Obstructive hypertrophic cardiomyopathy: Secondary | ICD-10-CM | POA: Diagnosis not present

## 2019-03-26 DIAGNOSIS — I10 Essential (primary) hypertension: Secondary | ICD-10-CM | POA: Diagnosis not present

## 2019-03-26 DIAGNOSIS — E1122 Type 2 diabetes mellitus with diabetic chronic kidney disease: Secondary | ICD-10-CM | POA: Diagnosis not present

## 2019-03-26 DIAGNOSIS — E782 Mixed hyperlipidemia: Secondary | ICD-10-CM | POA: Diagnosis not present

## 2019-03-26 DIAGNOSIS — I4891 Unspecified atrial fibrillation: Secondary | ICD-10-CM | POA: Diagnosis not present

## 2019-03-26 DIAGNOSIS — E039 Hypothyroidism, unspecified: Secondary | ICD-10-CM | POA: Diagnosis not present

## 2019-03-30 DIAGNOSIS — E1122 Type 2 diabetes mellitus with diabetic chronic kidney disease: Secondary | ICD-10-CM | POA: Diagnosis not present

## 2019-03-30 DIAGNOSIS — I10 Essential (primary) hypertension: Secondary | ICD-10-CM | POA: Diagnosis not present

## 2019-03-30 DIAGNOSIS — E782 Mixed hyperlipidemia: Secondary | ICD-10-CM | POA: Diagnosis not present

## 2019-04-10 DIAGNOSIS — G4730 Sleep apnea, unspecified: Secondary | ICD-10-CM | POA: Diagnosis not present

## 2019-04-10 DIAGNOSIS — I350 Nonrheumatic aortic (valve) stenosis: Secondary | ICD-10-CM | POA: Diagnosis not present

## 2019-04-10 DIAGNOSIS — R0602 Shortness of breath: Secondary | ICD-10-CM | POA: Diagnosis not present

## 2019-04-10 DIAGNOSIS — I420 Dilated cardiomyopathy: Secondary | ICD-10-CM | POA: Diagnosis not present

## 2019-04-10 DIAGNOSIS — E782 Mixed hyperlipidemia: Secondary | ICD-10-CM | POA: Diagnosis not present

## 2019-04-10 DIAGNOSIS — I4891 Unspecified atrial fibrillation: Secondary | ICD-10-CM | POA: Diagnosis not present

## 2019-04-10 DIAGNOSIS — I10 Essential (primary) hypertension: Secondary | ICD-10-CM | POA: Diagnosis not present

## 2019-04-15 DIAGNOSIS — G4739 Other sleep apnea: Secondary | ICD-10-CM | POA: Diagnosis not present

## 2019-04-15 DIAGNOSIS — E668 Other obesity: Secondary | ICD-10-CM | POA: Diagnosis not present

## 2019-04-15 DIAGNOSIS — E785 Hyperlipidemia, unspecified: Secondary | ICD-10-CM | POA: Diagnosis not present

## 2019-04-15 DIAGNOSIS — I480 Paroxysmal atrial fibrillation: Secondary | ICD-10-CM | POA: Diagnosis not present

## 2019-04-15 DIAGNOSIS — I060 Rheumatic aortic stenosis: Secondary | ICD-10-CM | POA: Diagnosis not present

## 2019-04-15 DIAGNOSIS — E1151 Type 2 diabetes mellitus with diabetic peripheral angiopathy without gangrene: Secondary | ICD-10-CM | POA: Diagnosis not present

## 2019-04-15 DIAGNOSIS — Z6841 Body Mass Index (BMI) 40.0 and over, adult: Secondary | ICD-10-CM | POA: Diagnosis not present

## 2019-04-15 DIAGNOSIS — F319 Bipolar disorder, unspecified: Secondary | ICD-10-CM | POA: Diagnosis not present

## 2019-04-15 DIAGNOSIS — F17210 Nicotine dependence, cigarettes, uncomplicated: Secondary | ICD-10-CM | POA: Diagnosis not present

## 2019-04-15 DIAGNOSIS — I119 Hypertensive heart disease without heart failure: Secondary | ICD-10-CM | POA: Diagnosis not present

## 2019-04-15 DIAGNOSIS — E1142 Type 2 diabetes mellitus with diabetic polyneuropathy: Secondary | ICD-10-CM | POA: Diagnosis not present

## 2019-04-15 DIAGNOSIS — I421 Obstructive hypertrophic cardiomyopathy: Secondary | ICD-10-CM | POA: Diagnosis not present

## 2019-04-15 DIAGNOSIS — Z794 Long term (current) use of insulin: Secondary | ICD-10-CM | POA: Diagnosis not present

## 2019-04-17 DIAGNOSIS — M9903 Segmental and somatic dysfunction of lumbar region: Secondary | ICD-10-CM | POA: Diagnosis not present

## 2019-04-17 DIAGNOSIS — M4012 Other secondary kyphosis, cervical region: Secondary | ICD-10-CM | POA: Diagnosis not present

## 2019-04-17 DIAGNOSIS — M9901 Segmental and somatic dysfunction of cervical region: Secondary | ICD-10-CM | POA: Diagnosis not present

## 2019-04-17 DIAGNOSIS — M5137 Other intervertebral disc degeneration, lumbosacral region: Secondary | ICD-10-CM | POA: Diagnosis not present

## 2019-04-17 DIAGNOSIS — M9904 Segmental and somatic dysfunction of sacral region: Secondary | ICD-10-CM | POA: Diagnosis not present

## 2019-04-17 DIAGNOSIS — M5030 Other cervical disc degeneration, unspecified cervical region: Secondary | ICD-10-CM | POA: Diagnosis not present

## 2019-04-17 DIAGNOSIS — M9902 Segmental and somatic dysfunction of thoracic region: Secondary | ICD-10-CM | POA: Diagnosis not present

## 2019-04-17 DIAGNOSIS — M4722 Other spondylosis with radiculopathy, cervical region: Secondary | ICD-10-CM | POA: Diagnosis not present

## 2019-04-17 DIAGNOSIS — M4727 Other spondylosis with radiculopathy, lumbosacral region: Secondary | ICD-10-CM | POA: Diagnosis not present

## 2019-04-17 DIAGNOSIS — M4014 Other secondary kyphosis, thoracic region: Secondary | ICD-10-CM | POA: Diagnosis not present

## 2019-04-20 DIAGNOSIS — M9904 Segmental and somatic dysfunction of sacral region: Secondary | ICD-10-CM | POA: Diagnosis not present

## 2019-04-20 DIAGNOSIS — M4012 Other secondary kyphosis, cervical region: Secondary | ICD-10-CM | POA: Diagnosis not present

## 2019-04-20 DIAGNOSIS — M5137 Other intervertebral disc degeneration, lumbosacral region: Secondary | ICD-10-CM | POA: Diagnosis not present

## 2019-04-20 DIAGNOSIS — M9901 Segmental and somatic dysfunction of cervical region: Secondary | ICD-10-CM | POA: Diagnosis not present

## 2019-04-20 DIAGNOSIS — M25539 Pain in unspecified wrist: Secondary | ICD-10-CM | POA: Diagnosis not present

## 2019-04-20 DIAGNOSIS — M9902 Segmental and somatic dysfunction of thoracic region: Secondary | ICD-10-CM | POA: Diagnosis not present

## 2019-04-20 DIAGNOSIS — M4722 Other spondylosis with radiculopathy, cervical region: Secondary | ICD-10-CM | POA: Diagnosis not present

## 2019-04-20 DIAGNOSIS — M5030 Other cervical disc degeneration, unspecified cervical region: Secondary | ICD-10-CM | POA: Diagnosis not present

## 2019-04-20 DIAGNOSIS — M4727 Other spondylosis with radiculopathy, lumbosacral region: Secondary | ICD-10-CM | POA: Diagnosis not present

## 2019-04-20 DIAGNOSIS — M25579 Pain in unspecified ankle and joints of unspecified foot: Secondary | ICD-10-CM | POA: Diagnosis not present

## 2019-04-20 DIAGNOSIS — M9903 Segmental and somatic dysfunction of lumbar region: Secondary | ICD-10-CM | POA: Diagnosis not present

## 2019-04-22 DIAGNOSIS — F1120 Opioid dependence, uncomplicated: Secondary | ICD-10-CM | POA: Diagnosis not present

## 2019-04-22 DIAGNOSIS — M545 Low back pain: Secondary | ICD-10-CM | POA: Diagnosis not present

## 2019-04-22 DIAGNOSIS — Z79899 Other long term (current) drug therapy: Secondary | ICD-10-CM | POA: Diagnosis not present

## 2019-04-22 DIAGNOSIS — F419 Anxiety disorder, unspecified: Secondary | ICD-10-CM | POA: Diagnosis not present

## 2019-04-22 DIAGNOSIS — G894 Chronic pain syndrome: Secondary | ICD-10-CM | POA: Diagnosis not present

## 2019-04-22 DIAGNOSIS — E669 Obesity, unspecified: Secondary | ICD-10-CM | POA: Diagnosis not present

## 2019-04-22 DIAGNOSIS — F319 Bipolar disorder, unspecified: Secondary | ICD-10-CM | POA: Diagnosis not present

## 2019-04-22 DIAGNOSIS — M5441 Lumbago with sciatica, right side: Secondary | ICD-10-CM | POA: Diagnosis not present

## 2019-04-23 DIAGNOSIS — M5030 Other cervical disc degeneration, unspecified cervical region: Secondary | ICD-10-CM | POA: Diagnosis not present

## 2019-04-23 DIAGNOSIS — M4722 Other spondylosis with radiculopathy, cervical region: Secondary | ICD-10-CM | POA: Diagnosis not present

## 2019-04-23 DIAGNOSIS — M9902 Segmental and somatic dysfunction of thoracic region: Secondary | ICD-10-CM | POA: Diagnosis not present

## 2019-04-23 DIAGNOSIS — M4727 Other spondylosis with radiculopathy, lumbosacral region: Secondary | ICD-10-CM | POA: Diagnosis not present

## 2019-04-23 DIAGNOSIS — M9903 Segmental and somatic dysfunction of lumbar region: Secondary | ICD-10-CM | POA: Diagnosis not present

## 2019-04-23 DIAGNOSIS — M4012 Other secondary kyphosis, cervical region: Secondary | ICD-10-CM | POA: Diagnosis not present

## 2019-04-23 DIAGNOSIS — M9901 Segmental and somatic dysfunction of cervical region: Secondary | ICD-10-CM | POA: Diagnosis not present

## 2019-04-23 DIAGNOSIS — M5137 Other intervertebral disc degeneration, lumbosacral region: Secondary | ICD-10-CM | POA: Diagnosis not present

## 2019-04-23 DIAGNOSIS — M9904 Segmental and somatic dysfunction of sacral region: Secondary | ICD-10-CM | POA: Diagnosis not present

## 2019-04-28 DIAGNOSIS — M9903 Segmental and somatic dysfunction of lumbar region: Secondary | ICD-10-CM | POA: Diagnosis not present

## 2019-04-28 DIAGNOSIS — M9901 Segmental and somatic dysfunction of cervical region: Secondary | ICD-10-CM | POA: Diagnosis not present

## 2019-04-28 DIAGNOSIS — M4722 Other spondylosis with radiculopathy, cervical region: Secondary | ICD-10-CM | POA: Diagnosis not present

## 2019-04-28 DIAGNOSIS — M9904 Segmental and somatic dysfunction of sacral region: Secondary | ICD-10-CM | POA: Diagnosis not present

## 2019-04-28 DIAGNOSIS — M4727 Other spondylosis with radiculopathy, lumbosacral region: Secondary | ICD-10-CM | POA: Diagnosis not present

## 2019-04-28 DIAGNOSIS — M4012 Other secondary kyphosis, cervical region: Secondary | ICD-10-CM | POA: Diagnosis not present

## 2019-04-28 DIAGNOSIS — M9902 Segmental and somatic dysfunction of thoracic region: Secondary | ICD-10-CM | POA: Diagnosis not present

## 2019-04-30 DIAGNOSIS — M9904 Segmental and somatic dysfunction of sacral region: Secondary | ICD-10-CM | POA: Diagnosis not present

## 2019-04-30 DIAGNOSIS — M4727 Other spondylosis with radiculopathy, lumbosacral region: Secondary | ICD-10-CM | POA: Diagnosis not present

## 2019-04-30 DIAGNOSIS — M9902 Segmental and somatic dysfunction of thoracic region: Secondary | ICD-10-CM | POA: Diagnosis not present

## 2019-04-30 DIAGNOSIS — M4012 Other secondary kyphosis, cervical region: Secondary | ICD-10-CM | POA: Diagnosis not present

## 2019-04-30 DIAGNOSIS — M4722 Other spondylosis with radiculopathy, cervical region: Secondary | ICD-10-CM | POA: Diagnosis not present

## 2019-04-30 DIAGNOSIS — M9901 Segmental and somatic dysfunction of cervical region: Secondary | ICD-10-CM | POA: Diagnosis not present

## 2019-04-30 DIAGNOSIS — M9903 Segmental and somatic dysfunction of lumbar region: Secondary | ICD-10-CM | POA: Diagnosis not present

## 2019-05-05 DIAGNOSIS — M4722 Other spondylosis with radiculopathy, cervical region: Secondary | ICD-10-CM | POA: Diagnosis not present

## 2019-05-05 DIAGNOSIS — M9904 Segmental and somatic dysfunction of sacral region: Secondary | ICD-10-CM | POA: Diagnosis not present

## 2019-05-05 DIAGNOSIS — M9901 Segmental and somatic dysfunction of cervical region: Secondary | ICD-10-CM | POA: Diagnosis not present

## 2019-05-05 DIAGNOSIS — M4012 Other secondary kyphosis, cervical region: Secondary | ICD-10-CM | POA: Diagnosis not present

## 2019-05-05 DIAGNOSIS — M9903 Segmental and somatic dysfunction of lumbar region: Secondary | ICD-10-CM | POA: Diagnosis not present

## 2019-05-05 DIAGNOSIS — M9902 Segmental and somatic dysfunction of thoracic region: Secondary | ICD-10-CM | POA: Diagnosis not present

## 2019-05-05 DIAGNOSIS — M4727 Other spondylosis with radiculopathy, lumbosacral region: Secondary | ICD-10-CM | POA: Diagnosis not present

## 2019-05-07 DIAGNOSIS — M4727 Other spondylosis with radiculopathy, lumbosacral region: Secondary | ICD-10-CM | POA: Diagnosis not present

## 2019-05-07 DIAGNOSIS — M9901 Segmental and somatic dysfunction of cervical region: Secondary | ICD-10-CM | POA: Diagnosis not present

## 2019-05-07 DIAGNOSIS — M4722 Other spondylosis with radiculopathy, cervical region: Secondary | ICD-10-CM | POA: Diagnosis not present

## 2019-05-07 DIAGNOSIS — M4012 Other secondary kyphosis, cervical region: Secondary | ICD-10-CM | POA: Diagnosis not present

## 2019-05-07 DIAGNOSIS — M9903 Segmental and somatic dysfunction of lumbar region: Secondary | ICD-10-CM | POA: Diagnosis not present

## 2019-05-07 DIAGNOSIS — M9902 Segmental and somatic dysfunction of thoracic region: Secondary | ICD-10-CM | POA: Diagnosis not present

## 2019-05-07 DIAGNOSIS — M9904 Segmental and somatic dysfunction of sacral region: Secondary | ICD-10-CM | POA: Diagnosis not present

## 2019-05-12 DIAGNOSIS — M9904 Segmental and somatic dysfunction of sacral region: Secondary | ICD-10-CM | POA: Diagnosis not present

## 2019-05-12 DIAGNOSIS — M9902 Segmental and somatic dysfunction of thoracic region: Secondary | ICD-10-CM | POA: Diagnosis not present

## 2019-05-12 DIAGNOSIS — M4727 Other spondylosis with radiculopathy, lumbosacral region: Secondary | ICD-10-CM | POA: Diagnosis not present

## 2019-05-12 DIAGNOSIS — M9903 Segmental and somatic dysfunction of lumbar region: Secondary | ICD-10-CM | POA: Diagnosis not present

## 2019-05-12 DIAGNOSIS — M4012 Other secondary kyphosis, cervical region: Secondary | ICD-10-CM | POA: Diagnosis not present

## 2019-05-12 DIAGNOSIS — M4722 Other spondylosis with radiculopathy, cervical region: Secondary | ICD-10-CM | POA: Diagnosis not present

## 2019-05-12 DIAGNOSIS — M9901 Segmental and somatic dysfunction of cervical region: Secondary | ICD-10-CM | POA: Diagnosis not present

## 2019-05-19 DIAGNOSIS — M9904 Segmental and somatic dysfunction of sacral region: Secondary | ICD-10-CM | POA: Diagnosis not present

## 2019-05-19 DIAGNOSIS — M4012 Other secondary kyphosis, cervical region: Secondary | ICD-10-CM | POA: Diagnosis not present

## 2019-05-19 DIAGNOSIS — M9902 Segmental and somatic dysfunction of thoracic region: Secondary | ICD-10-CM | POA: Diagnosis not present

## 2019-05-19 DIAGNOSIS — M9901 Segmental and somatic dysfunction of cervical region: Secondary | ICD-10-CM | POA: Diagnosis not present

## 2019-05-19 DIAGNOSIS — M4722 Other spondylosis with radiculopathy, cervical region: Secondary | ICD-10-CM | POA: Diagnosis not present

## 2019-05-19 DIAGNOSIS — M9903 Segmental and somatic dysfunction of lumbar region: Secondary | ICD-10-CM | POA: Diagnosis not present

## 2019-05-19 DIAGNOSIS — M4727 Other spondylosis with radiculopathy, lumbosacral region: Secondary | ICD-10-CM | POA: Diagnosis not present

## 2019-05-20 DIAGNOSIS — G894 Chronic pain syndrome: Secondary | ICD-10-CM | POA: Diagnosis not present

## 2019-05-20 DIAGNOSIS — E669 Obesity, unspecified: Secondary | ICD-10-CM | POA: Diagnosis not present

## 2019-05-20 DIAGNOSIS — F419 Anxiety disorder, unspecified: Secondary | ICD-10-CM | POA: Diagnosis not present

## 2019-05-20 DIAGNOSIS — M5441 Lumbago with sciatica, right side: Secondary | ICD-10-CM | POA: Diagnosis not present

## 2019-05-20 DIAGNOSIS — F1120 Opioid dependence, uncomplicated: Secondary | ICD-10-CM | POA: Diagnosis not present

## 2019-05-20 DIAGNOSIS — F319 Bipolar disorder, unspecified: Secondary | ICD-10-CM | POA: Diagnosis not present

## 2019-05-26 DIAGNOSIS — M9904 Segmental and somatic dysfunction of sacral region: Secondary | ICD-10-CM | POA: Diagnosis not present

## 2019-05-26 DIAGNOSIS — M4722 Other spondylosis with radiculopathy, cervical region: Secondary | ICD-10-CM | POA: Diagnosis not present

## 2019-05-26 DIAGNOSIS — M9903 Segmental and somatic dysfunction of lumbar region: Secondary | ICD-10-CM | POA: Diagnosis not present

## 2019-05-26 DIAGNOSIS — M9901 Segmental and somatic dysfunction of cervical region: Secondary | ICD-10-CM | POA: Diagnosis not present

## 2019-05-26 DIAGNOSIS — M9902 Segmental and somatic dysfunction of thoracic region: Secondary | ICD-10-CM | POA: Diagnosis not present

## 2019-05-26 DIAGNOSIS — M4012 Other secondary kyphosis, cervical region: Secondary | ICD-10-CM | POA: Diagnosis not present

## 2019-05-26 DIAGNOSIS — M4727 Other spondylosis with radiculopathy, lumbosacral region: Secondary | ICD-10-CM | POA: Diagnosis not present

## 2019-06-02 DIAGNOSIS — M9901 Segmental and somatic dysfunction of cervical region: Secondary | ICD-10-CM | POA: Diagnosis not present

## 2019-06-02 DIAGNOSIS — M4722 Other spondylosis with radiculopathy, cervical region: Secondary | ICD-10-CM | POA: Diagnosis not present

## 2019-06-02 DIAGNOSIS — M4012 Other secondary kyphosis, cervical region: Secondary | ICD-10-CM | POA: Diagnosis not present

## 2019-06-02 DIAGNOSIS — M9903 Segmental and somatic dysfunction of lumbar region: Secondary | ICD-10-CM | POA: Diagnosis not present

## 2019-06-02 DIAGNOSIS — M4727 Other spondylosis with radiculopathy, lumbosacral region: Secondary | ICD-10-CM | POA: Diagnosis not present

## 2019-06-02 DIAGNOSIS — M9904 Segmental and somatic dysfunction of sacral region: Secondary | ICD-10-CM | POA: Diagnosis not present

## 2019-06-02 DIAGNOSIS — M9902 Segmental and somatic dysfunction of thoracic region: Secondary | ICD-10-CM | POA: Diagnosis not present

## 2019-06-09 DIAGNOSIS — G4733 Obstructive sleep apnea (adult) (pediatric): Secondary | ICD-10-CM | POA: Diagnosis not present

## 2019-06-16 DIAGNOSIS — M9902 Segmental and somatic dysfunction of thoracic region: Secondary | ICD-10-CM | POA: Diagnosis not present

## 2019-06-16 DIAGNOSIS — M9901 Segmental and somatic dysfunction of cervical region: Secondary | ICD-10-CM | POA: Diagnosis not present

## 2019-06-16 DIAGNOSIS — M9904 Segmental and somatic dysfunction of sacral region: Secondary | ICD-10-CM | POA: Diagnosis not present

## 2019-06-16 DIAGNOSIS — M4012 Other secondary kyphosis, cervical region: Secondary | ICD-10-CM | POA: Diagnosis not present

## 2019-06-16 DIAGNOSIS — M9903 Segmental and somatic dysfunction of lumbar region: Secondary | ICD-10-CM | POA: Diagnosis not present

## 2019-06-16 DIAGNOSIS — M4722 Other spondylosis with radiculopathy, cervical region: Secondary | ICD-10-CM | POA: Diagnosis not present

## 2019-06-16 DIAGNOSIS — M4727 Other spondylosis with radiculopathy, lumbosacral region: Secondary | ICD-10-CM | POA: Diagnosis not present

## 2019-06-25 DIAGNOSIS — E559 Vitamin D deficiency, unspecified: Secondary | ICD-10-CM | POA: Diagnosis not present

## 2019-06-25 DIAGNOSIS — E039 Hypothyroidism, unspecified: Secondary | ICD-10-CM | POA: Diagnosis not present

## 2019-06-25 DIAGNOSIS — E109 Type 1 diabetes mellitus without complications: Secondary | ICD-10-CM | POA: Diagnosis not present

## 2019-06-25 DIAGNOSIS — I10 Essential (primary) hypertension: Secondary | ICD-10-CM | POA: Diagnosis not present

## 2019-06-25 DIAGNOSIS — E782 Mixed hyperlipidemia: Secondary | ICD-10-CM | POA: Diagnosis not present

## 2019-06-29 DIAGNOSIS — G4730 Sleep apnea, unspecified: Secondary | ICD-10-CM | POA: Diagnosis not present

## 2019-06-29 DIAGNOSIS — E782 Mixed hyperlipidemia: Secondary | ICD-10-CM | POA: Diagnosis not present

## 2019-06-29 DIAGNOSIS — J0101 Acute recurrent maxillary sinusitis: Secondary | ICD-10-CM | POA: Diagnosis not present

## 2019-06-29 DIAGNOSIS — E1122 Type 2 diabetes mellitus with diabetic chronic kidney disease: Secondary | ICD-10-CM | POA: Diagnosis not present

## 2019-06-29 DIAGNOSIS — I10 Essential (primary) hypertension: Secondary | ICD-10-CM | POA: Diagnosis not present

## 2019-06-29 DIAGNOSIS — E039 Hypothyroidism, unspecified: Secondary | ICD-10-CM | POA: Diagnosis not present

## 2019-06-30 DIAGNOSIS — M9901 Segmental and somatic dysfunction of cervical region: Secondary | ICD-10-CM | POA: Diagnosis not present

## 2019-06-30 DIAGNOSIS — M4012 Other secondary kyphosis, cervical region: Secondary | ICD-10-CM | POA: Diagnosis not present

## 2019-06-30 DIAGNOSIS — M9904 Segmental and somatic dysfunction of sacral region: Secondary | ICD-10-CM | POA: Diagnosis not present

## 2019-06-30 DIAGNOSIS — M9902 Segmental and somatic dysfunction of thoracic region: Secondary | ICD-10-CM | POA: Diagnosis not present

## 2019-06-30 DIAGNOSIS — M4727 Other spondylosis with radiculopathy, lumbosacral region: Secondary | ICD-10-CM | POA: Diagnosis not present

## 2019-06-30 DIAGNOSIS — M4722 Other spondylosis with radiculopathy, cervical region: Secondary | ICD-10-CM | POA: Diagnosis not present

## 2019-06-30 DIAGNOSIS — M9903 Segmental and somatic dysfunction of lumbar region: Secondary | ICD-10-CM | POA: Diagnosis not present

## 2019-07-01 DIAGNOSIS — F1120 Opioid dependence, uncomplicated: Secondary | ICD-10-CM | POA: Diagnosis not present

## 2019-07-01 DIAGNOSIS — M5441 Lumbago with sciatica, right side: Secondary | ICD-10-CM | POA: Diagnosis not present

## 2019-07-01 DIAGNOSIS — G894 Chronic pain syndrome: Secondary | ICD-10-CM | POA: Diagnosis not present

## 2019-07-01 DIAGNOSIS — F419 Anxiety disorder, unspecified: Secondary | ICD-10-CM | POA: Diagnosis not present

## 2019-07-01 DIAGNOSIS — F319 Bipolar disorder, unspecified: Secondary | ICD-10-CM | POA: Diagnosis not present

## 2019-07-01 DIAGNOSIS — E669 Obesity, unspecified: Secondary | ICD-10-CM | POA: Diagnosis not present

## 2019-07-09 DIAGNOSIS — G4730 Sleep apnea, unspecified: Secondary | ICD-10-CM | POA: Diagnosis not present

## 2019-07-09 DIAGNOSIS — I10 Essential (primary) hypertension: Secondary | ICD-10-CM | POA: Diagnosis not present

## 2019-07-09 DIAGNOSIS — I4891 Unspecified atrial fibrillation: Secondary | ICD-10-CM | POA: Diagnosis not present

## 2019-07-09 DIAGNOSIS — E782 Mixed hyperlipidemia: Secondary | ICD-10-CM | POA: Diagnosis not present

## 2019-07-09 DIAGNOSIS — I340 Nonrheumatic mitral (valve) insufficiency: Secondary | ICD-10-CM | POA: Diagnosis not present

## 2019-07-09 DIAGNOSIS — E668 Other obesity: Secondary | ICD-10-CM | POA: Diagnosis not present

## 2019-07-14 DIAGNOSIS — M9903 Segmental and somatic dysfunction of lumbar region: Secondary | ICD-10-CM | POA: Diagnosis not present

## 2019-07-14 DIAGNOSIS — M4012 Other secondary kyphosis, cervical region: Secondary | ICD-10-CM | POA: Diagnosis not present

## 2019-07-14 DIAGNOSIS — M9901 Segmental and somatic dysfunction of cervical region: Secondary | ICD-10-CM | POA: Diagnosis not present

## 2019-07-14 DIAGNOSIS — M4727 Other spondylosis with radiculopathy, lumbosacral region: Secondary | ICD-10-CM | POA: Diagnosis not present

## 2019-07-14 DIAGNOSIS — M4722 Other spondylosis with radiculopathy, cervical region: Secondary | ICD-10-CM | POA: Diagnosis not present

## 2019-07-14 DIAGNOSIS — M5137 Other intervertebral disc degeneration, lumbosacral region: Secondary | ICD-10-CM | POA: Diagnosis not present

## 2019-07-14 DIAGNOSIS — M5030 Other cervical disc degeneration, unspecified cervical region: Secondary | ICD-10-CM | POA: Diagnosis not present

## 2019-07-14 DIAGNOSIS — M9904 Segmental and somatic dysfunction of sacral region: Secondary | ICD-10-CM | POA: Diagnosis not present

## 2019-07-27 DIAGNOSIS — E039 Hypothyroidism, unspecified: Secondary | ICD-10-CM | POA: Diagnosis not present

## 2019-07-27 DIAGNOSIS — E1122 Type 2 diabetes mellitus with diabetic chronic kidney disease: Secondary | ICD-10-CM | POA: Diagnosis not present

## 2019-07-27 DIAGNOSIS — J0101 Acute recurrent maxillary sinusitis: Secondary | ICD-10-CM | POA: Diagnosis not present

## 2019-07-30 ENCOUNTER — Other Ambulatory Visit: Payer: Self-pay | Admitting: Internal Medicine

## 2019-07-30 DIAGNOSIS — I10 Essential (primary) hypertension: Secondary | ICD-10-CM | POA: Diagnosis not present

## 2019-07-30 DIAGNOSIS — E782 Mixed hyperlipidemia: Secondary | ICD-10-CM | POA: Diagnosis not present

## 2019-07-30 DIAGNOSIS — J3089 Other allergic rhinitis: Secondary | ICD-10-CM | POA: Diagnosis not present

## 2019-07-30 DIAGNOSIS — E039 Hypothyroidism, unspecified: Secondary | ICD-10-CM | POA: Diagnosis not present

## 2019-07-30 DIAGNOSIS — G4730 Sleep apnea, unspecified: Secondary | ICD-10-CM | POA: Diagnosis not present

## 2019-07-30 DIAGNOSIS — E1122 Type 2 diabetes mellitus with diabetic chronic kidney disease: Secondary | ICD-10-CM | POA: Diagnosis not present

## 2019-07-30 DIAGNOSIS — Z1231 Encounter for screening mammogram for malignant neoplasm of breast: Secondary | ICD-10-CM

## 2019-08-05 DIAGNOSIS — M5137 Other intervertebral disc degeneration, lumbosacral region: Secondary | ICD-10-CM | POA: Diagnosis not present

## 2019-08-05 DIAGNOSIS — M9901 Segmental and somatic dysfunction of cervical region: Secondary | ICD-10-CM | POA: Diagnosis not present

## 2019-08-05 DIAGNOSIS — M4012 Other secondary kyphosis, cervical region: Secondary | ICD-10-CM | POA: Diagnosis not present

## 2019-08-05 DIAGNOSIS — M4722 Other spondylosis with radiculopathy, cervical region: Secondary | ICD-10-CM | POA: Diagnosis not present

## 2019-08-05 DIAGNOSIS — M5030 Other cervical disc degeneration, unspecified cervical region: Secondary | ICD-10-CM | POA: Diagnosis not present

## 2019-08-05 DIAGNOSIS — M9904 Segmental and somatic dysfunction of sacral region: Secondary | ICD-10-CM | POA: Diagnosis not present

## 2019-08-05 DIAGNOSIS — M4727 Other spondylosis with radiculopathy, lumbosacral region: Secondary | ICD-10-CM | POA: Diagnosis not present

## 2019-08-05 DIAGNOSIS — M9903 Segmental and somatic dysfunction of lumbar region: Secondary | ICD-10-CM | POA: Diagnosis not present

## 2019-08-12 DIAGNOSIS — M545 Low back pain: Secondary | ICD-10-CM | POA: Diagnosis not present

## 2019-08-12 DIAGNOSIS — E669 Obesity, unspecified: Secondary | ICD-10-CM | POA: Diagnosis not present

## 2019-08-12 DIAGNOSIS — F319 Bipolar disorder, unspecified: Secondary | ICD-10-CM | POA: Diagnosis not present

## 2019-08-12 DIAGNOSIS — F419 Anxiety disorder, unspecified: Secondary | ICD-10-CM | POA: Diagnosis not present

## 2019-08-12 DIAGNOSIS — Z79899 Other long term (current) drug therapy: Secondary | ICD-10-CM | POA: Diagnosis not present

## 2019-08-12 DIAGNOSIS — F1120 Opioid dependence, uncomplicated: Secondary | ICD-10-CM | POA: Diagnosis not present

## 2019-08-12 DIAGNOSIS — G894 Chronic pain syndrome: Secondary | ICD-10-CM | POA: Diagnosis not present

## 2019-08-12 DIAGNOSIS — M5441 Lumbago with sciatica, right side: Secondary | ICD-10-CM | POA: Diagnosis not present

## 2019-08-19 DIAGNOSIS — M9901 Segmental and somatic dysfunction of cervical region: Secondary | ICD-10-CM | POA: Diagnosis not present

## 2019-08-19 DIAGNOSIS — M9904 Segmental and somatic dysfunction of sacral region: Secondary | ICD-10-CM | POA: Diagnosis not present

## 2019-08-19 DIAGNOSIS — M4012 Other secondary kyphosis, cervical region: Secondary | ICD-10-CM | POA: Diagnosis not present

## 2019-08-19 DIAGNOSIS — M4722 Other spondylosis with radiculopathy, cervical region: Secondary | ICD-10-CM | POA: Diagnosis not present

## 2019-08-19 DIAGNOSIS — M9903 Segmental and somatic dysfunction of lumbar region: Secondary | ICD-10-CM | POA: Diagnosis not present

## 2019-08-19 DIAGNOSIS — M5137 Other intervertebral disc degeneration, lumbosacral region: Secondary | ICD-10-CM | POA: Diagnosis not present

## 2019-08-19 DIAGNOSIS — M4727 Other spondylosis with radiculopathy, lumbosacral region: Secondary | ICD-10-CM | POA: Diagnosis not present

## 2019-08-19 DIAGNOSIS — M5030 Other cervical disc degeneration, unspecified cervical region: Secondary | ICD-10-CM | POA: Diagnosis not present

## 2019-09-02 DIAGNOSIS — M9903 Segmental and somatic dysfunction of lumbar region: Secondary | ICD-10-CM | POA: Diagnosis not present

## 2019-09-02 DIAGNOSIS — M9901 Segmental and somatic dysfunction of cervical region: Secondary | ICD-10-CM | POA: Diagnosis not present

## 2019-09-02 DIAGNOSIS — M4727 Other spondylosis with radiculopathy, lumbosacral region: Secondary | ICD-10-CM | POA: Diagnosis not present

## 2019-09-02 DIAGNOSIS — M4722 Other spondylosis with radiculopathy, cervical region: Secondary | ICD-10-CM | POA: Diagnosis not present

## 2019-09-02 DIAGNOSIS — M4012 Other secondary kyphosis, cervical region: Secondary | ICD-10-CM | POA: Diagnosis not present

## 2019-09-02 DIAGNOSIS — M5030 Other cervical disc degeneration, unspecified cervical region: Secondary | ICD-10-CM | POA: Diagnosis not present

## 2019-09-02 DIAGNOSIS — M5137 Other intervertebral disc degeneration, lumbosacral region: Secondary | ICD-10-CM | POA: Diagnosis not present

## 2019-09-02 DIAGNOSIS — M9904 Segmental and somatic dysfunction of sacral region: Secondary | ICD-10-CM | POA: Diagnosis not present

## 2019-09-09 DIAGNOSIS — M545 Low back pain: Secondary | ICD-10-CM | POA: Diagnosis not present

## 2019-09-09 DIAGNOSIS — G4733 Obstructive sleep apnea (adult) (pediatric): Secondary | ICD-10-CM | POA: Diagnosis not present

## 2019-09-09 DIAGNOSIS — G894 Chronic pain syndrome: Secondary | ICD-10-CM | POA: Diagnosis not present

## 2019-09-09 DIAGNOSIS — F419 Anxiety disorder, unspecified: Secondary | ICD-10-CM | POA: Diagnosis not present

## 2019-09-09 DIAGNOSIS — F1120 Opioid dependence, uncomplicated: Secondary | ICD-10-CM | POA: Diagnosis not present

## 2019-09-09 DIAGNOSIS — F319 Bipolar disorder, unspecified: Secondary | ICD-10-CM | POA: Diagnosis not present

## 2019-09-09 DIAGNOSIS — E669 Obesity, unspecified: Secondary | ICD-10-CM | POA: Diagnosis not present

## 2019-09-09 DIAGNOSIS — M5441 Lumbago with sciatica, right side: Secondary | ICD-10-CM | POA: Diagnosis not present

## 2019-09-09 DIAGNOSIS — Z79899 Other long term (current) drug therapy: Secondary | ICD-10-CM | POA: Diagnosis not present

## 2019-09-10 ENCOUNTER — Ambulatory Visit
Admission: RE | Admit: 2019-09-10 | Discharge: 2019-09-10 | Disposition: A | Discharge status: Home / Self Care | Payer: PPO | Source: Ambulatory Visit | Attending: Internal Medicine | Admitting: Internal Medicine

## 2019-09-10 DIAGNOSIS — Z1231 Encounter for screening mammogram for malignant neoplasm of breast: Secondary | ICD-10-CM | POA: Diagnosis not present

## 2019-09-14 ENCOUNTER — Other Ambulatory Visit: Payer: Self-pay | Admitting: Internal Medicine

## 2019-09-14 DIAGNOSIS — N6310 Unspecified lump in the right breast, unspecified quadrant: Secondary | ICD-10-CM

## 2019-09-14 DIAGNOSIS — R928 Other abnormal and inconclusive findings on diagnostic imaging of breast: Secondary | ICD-10-CM

## 2019-09-16 DIAGNOSIS — M5137 Other intervertebral disc degeneration, lumbosacral region: Secondary | ICD-10-CM | POA: Diagnosis not present

## 2019-09-16 DIAGNOSIS — M9904 Segmental and somatic dysfunction of sacral region: Secondary | ICD-10-CM | POA: Diagnosis not present

## 2019-09-16 DIAGNOSIS — M9901 Segmental and somatic dysfunction of cervical region: Secondary | ICD-10-CM | POA: Diagnosis not present

## 2019-09-16 DIAGNOSIS — M5030 Other cervical disc degeneration, unspecified cervical region: Secondary | ICD-10-CM | POA: Diagnosis not present

## 2019-09-16 DIAGNOSIS — M4012 Other secondary kyphosis, cervical region: Secondary | ICD-10-CM | POA: Diagnosis not present

## 2019-09-16 DIAGNOSIS — M9903 Segmental and somatic dysfunction of lumbar region: Secondary | ICD-10-CM | POA: Diagnosis not present

## 2019-09-16 DIAGNOSIS — M4722 Other spondylosis with radiculopathy, cervical region: Secondary | ICD-10-CM | POA: Diagnosis not present

## 2019-09-16 DIAGNOSIS — M4727 Other spondylosis with radiculopathy, lumbosacral region: Secondary | ICD-10-CM | POA: Diagnosis not present

## 2019-09-17 DIAGNOSIS — I340 Nonrheumatic mitral (valve) insufficiency: Secondary | ICD-10-CM | POA: Diagnosis not present

## 2019-09-25 ENCOUNTER — Ambulatory Visit
Admission: RE | Admit: 2019-09-25 | Discharge: 2019-09-25 | Disposition: A | Discharge status: Home / Self Care | Payer: PPO | Source: Ambulatory Visit | Attending: Internal Medicine | Admitting: Internal Medicine

## 2019-09-25 DIAGNOSIS — R928 Other abnormal and inconclusive findings on diagnostic imaging of breast: Secondary | ICD-10-CM | POA: Diagnosis not present

## 2019-09-25 DIAGNOSIS — N6310 Unspecified lump in the right breast, unspecified quadrant: Secondary | ICD-10-CM | POA: Diagnosis not present

## 2019-09-29 ENCOUNTER — Other Ambulatory Visit: Payer: Self-pay | Admitting: Internal Medicine

## 2019-09-29 DIAGNOSIS — N6310 Unspecified lump in the right breast, unspecified quadrant: Secondary | ICD-10-CM

## 2019-09-30 DIAGNOSIS — M9901 Segmental and somatic dysfunction of cervical region: Secondary | ICD-10-CM | POA: Diagnosis not present

## 2019-09-30 DIAGNOSIS — M4012 Other secondary kyphosis, cervical region: Secondary | ICD-10-CM | POA: Diagnosis not present

## 2019-09-30 DIAGNOSIS — M5030 Other cervical disc degeneration, unspecified cervical region: Secondary | ICD-10-CM | POA: Diagnosis not present

## 2019-09-30 DIAGNOSIS — M9904 Segmental and somatic dysfunction of sacral region: Secondary | ICD-10-CM | POA: Diagnosis not present

## 2019-09-30 DIAGNOSIS — M9903 Segmental and somatic dysfunction of lumbar region: Secondary | ICD-10-CM | POA: Diagnosis not present

## 2019-09-30 DIAGNOSIS — M5137 Other intervertebral disc degeneration, lumbosacral region: Secondary | ICD-10-CM | POA: Diagnosis not present

## 2019-10-01 DIAGNOSIS — E119 Type 2 diabetes mellitus without complications: Secondary | ICD-10-CM | POA: Diagnosis not present

## 2019-10-01 DIAGNOSIS — I10 Essential (primary) hypertension: Secondary | ICD-10-CM | POA: Diagnosis not present

## 2019-10-01 DIAGNOSIS — E039 Hypothyroidism, unspecified: Secondary | ICD-10-CM | POA: Diagnosis not present

## 2019-10-01 DIAGNOSIS — E782 Mixed hyperlipidemia: Secondary | ICD-10-CM | POA: Diagnosis not present

## 2019-10-01 DIAGNOSIS — G4730 Sleep apnea, unspecified: Secondary | ICD-10-CM | POA: Diagnosis not present

## 2019-10-08 DIAGNOSIS — I4891 Unspecified atrial fibrillation: Secondary | ICD-10-CM | POA: Diagnosis not present

## 2019-10-08 DIAGNOSIS — E782 Mixed hyperlipidemia: Secondary | ICD-10-CM | POA: Diagnosis not present

## 2019-10-08 DIAGNOSIS — I340 Nonrheumatic mitral (valve) insufficiency: Secondary | ICD-10-CM | POA: Diagnosis not present

## 2019-10-08 DIAGNOSIS — G4730 Sleep apnea, unspecified: Secondary | ICD-10-CM | POA: Diagnosis not present

## 2019-10-08 DIAGNOSIS — I420 Dilated cardiomyopathy: Secondary | ICD-10-CM | POA: Diagnosis not present

## 2019-10-08 DIAGNOSIS — I350 Nonrheumatic aortic (valve) stenosis: Secondary | ICD-10-CM | POA: Diagnosis not present

## 2019-10-08 DIAGNOSIS — E781 Pure hyperglyceridemia: Secondary | ICD-10-CM | POA: Diagnosis not present

## 2019-10-08 DIAGNOSIS — I10 Essential (primary) hypertension: Secondary | ICD-10-CM | POA: Diagnosis not present

## 2019-10-16 DIAGNOSIS — E119 Type 2 diabetes mellitus without complications: Secondary | ICD-10-CM | POA: Diagnosis not present

## 2019-10-16 DIAGNOSIS — F1120 Opioid dependence, uncomplicated: Secondary | ICD-10-CM | POA: Diagnosis not present

## 2019-10-16 DIAGNOSIS — Z79899 Other long term (current) drug therapy: Secondary | ICD-10-CM | POA: Diagnosis not present

## 2019-11-04 DIAGNOSIS — G894 Chronic pain syndrome: Secondary | ICD-10-CM | POA: Diagnosis not present

## 2019-11-04 DIAGNOSIS — M545 Low back pain: Secondary | ICD-10-CM | POA: Diagnosis not present

## 2019-11-04 DIAGNOSIS — E669 Obesity, unspecified: Secondary | ICD-10-CM | POA: Diagnosis not present

## 2019-11-04 DIAGNOSIS — M5441 Lumbago with sciatica, right side: Secondary | ICD-10-CM | POA: Diagnosis not present

## 2019-11-04 DIAGNOSIS — F419 Anxiety disorder, unspecified: Secondary | ICD-10-CM | POA: Diagnosis not present

## 2019-11-04 DIAGNOSIS — F1120 Opioid dependence, uncomplicated: Secondary | ICD-10-CM | POA: Diagnosis not present

## 2019-11-04 DIAGNOSIS — F319 Bipolar disorder, unspecified: Secondary | ICD-10-CM | POA: Diagnosis not present

## 2019-11-13 DIAGNOSIS — F1120 Opioid dependence, uncomplicated: Secondary | ICD-10-CM | POA: Diagnosis not present

## 2019-11-13 DIAGNOSIS — Z79899 Other long term (current) drug therapy: Secondary | ICD-10-CM | POA: Diagnosis not present

## 2019-12-02 DIAGNOSIS — M5441 Lumbago with sciatica, right side: Secondary | ICD-10-CM | POA: Diagnosis not present

## 2019-12-02 DIAGNOSIS — Z79891 Long term (current) use of opiate analgesic: Secondary | ICD-10-CM | POA: Diagnosis not present

## 2019-12-02 DIAGNOSIS — F319 Bipolar disorder, unspecified: Secondary | ICD-10-CM | POA: Diagnosis not present

## 2019-12-02 DIAGNOSIS — F1120 Opioid dependence, uncomplicated: Secondary | ICD-10-CM | POA: Diagnosis not present

## 2019-12-02 DIAGNOSIS — G894 Chronic pain syndrome: Secondary | ICD-10-CM | POA: Diagnosis not present

## 2019-12-02 DIAGNOSIS — F419 Anxiety disorder, unspecified: Secondary | ICD-10-CM | POA: Diagnosis not present

## 2019-12-02 DIAGNOSIS — E669 Obesity, unspecified: Secondary | ICD-10-CM | POA: Diagnosis not present

## 2019-12-10 DIAGNOSIS — G4733 Obstructive sleep apnea (adult) (pediatric): Secondary | ICD-10-CM | POA: Diagnosis not present

## 2019-12-30 DIAGNOSIS — M5441 Lumbago with sciatica, right side: Secondary | ICD-10-CM | POA: Diagnosis not present

## 2019-12-30 DIAGNOSIS — F319 Bipolar disorder, unspecified: Secondary | ICD-10-CM | POA: Diagnosis not present

## 2019-12-30 DIAGNOSIS — G894 Chronic pain syndrome: Secondary | ICD-10-CM | POA: Diagnosis not present

## 2020-01-01 DIAGNOSIS — Z23 Encounter for immunization: Secondary | ICD-10-CM | POA: Diagnosis not present

## 2020-01-01 DIAGNOSIS — F5101 Primary insomnia: Secondary | ICD-10-CM | POA: Diagnosis not present

## 2020-01-01 DIAGNOSIS — I10 Essential (primary) hypertension: Secondary | ICD-10-CM | POA: Diagnosis not present

## 2020-01-01 DIAGNOSIS — F411 Generalized anxiety disorder: Secondary | ICD-10-CM | POA: Diagnosis not present

## 2020-01-01 DIAGNOSIS — E1140 Type 2 diabetes mellitus with diabetic neuropathy, unspecified: Secondary | ICD-10-CM | POA: Diagnosis not present

## 2020-01-01 DIAGNOSIS — D519 Vitamin B12 deficiency anemia, unspecified: Secondary | ICD-10-CM | POA: Diagnosis not present

## 2020-01-01 DIAGNOSIS — E559 Vitamin D deficiency, unspecified: Secondary | ICD-10-CM | POA: Diagnosis not present

## 2020-01-01 DIAGNOSIS — R609 Edema, unspecified: Secondary | ICD-10-CM | POA: Diagnosis not present

## 2020-01-01 DIAGNOSIS — E782 Mixed hyperlipidemia: Secondary | ICD-10-CM | POA: Diagnosis not present

## 2020-01-01 DIAGNOSIS — E039 Hypothyroidism, unspecified: Secondary | ICD-10-CM | POA: Diagnosis not present

## 2020-01-08 DIAGNOSIS — I4891 Unspecified atrial fibrillation: Secondary | ICD-10-CM | POA: Diagnosis not present

## 2020-01-08 DIAGNOSIS — I350 Nonrheumatic aortic (valve) stenosis: Secondary | ICD-10-CM | POA: Diagnosis not present

## 2020-01-08 DIAGNOSIS — E119 Type 2 diabetes mellitus without complications: Secondary | ICD-10-CM | POA: Diagnosis not present

## 2020-01-08 DIAGNOSIS — E782 Mixed hyperlipidemia: Secondary | ICD-10-CM | POA: Diagnosis not present

## 2020-01-08 DIAGNOSIS — G4730 Sleep apnea, unspecified: Secondary | ICD-10-CM | POA: Diagnosis not present

## 2020-01-08 DIAGNOSIS — I10 Essential (primary) hypertension: Secondary | ICD-10-CM | POA: Diagnosis not present

## 2020-01-08 DIAGNOSIS — E781 Pure hyperglyceridemia: Secondary | ICD-10-CM | POA: Diagnosis not present

## 2020-01-19 DIAGNOSIS — Z79891 Long term (current) use of opiate analgesic: Secondary | ICD-10-CM | POA: Diagnosis not present

## 2020-01-19 DIAGNOSIS — F1120 Opioid dependence, uncomplicated: Secondary | ICD-10-CM | POA: Diagnosis not present

## 2020-01-27 DIAGNOSIS — F419 Anxiety disorder, unspecified: Secondary | ICD-10-CM | POA: Diagnosis not present

## 2020-01-27 DIAGNOSIS — E669 Obesity, unspecified: Secondary | ICD-10-CM | POA: Diagnosis not present

## 2020-01-27 DIAGNOSIS — M5441 Lumbago with sciatica, right side: Secondary | ICD-10-CM | POA: Diagnosis not present

## 2020-01-27 DIAGNOSIS — F1120 Opioid dependence, uncomplicated: Secondary | ICD-10-CM | POA: Diagnosis not present

## 2020-01-27 DIAGNOSIS — G894 Chronic pain syndrome: Secondary | ICD-10-CM | POA: Diagnosis not present

## 2020-01-27 DIAGNOSIS — F319 Bipolar disorder, unspecified: Secondary | ICD-10-CM | POA: Diagnosis not present

## 2020-02-26 DIAGNOSIS — Z79891 Long term (current) use of opiate analgesic: Secondary | ICD-10-CM | POA: Diagnosis not present

## 2020-02-26 DIAGNOSIS — F1120 Opioid dependence, uncomplicated: Secondary | ICD-10-CM | POA: Diagnosis not present

## 2020-02-29 DIAGNOSIS — F419 Anxiety disorder, unspecified: Secondary | ICD-10-CM | POA: Diagnosis not present

## 2020-02-29 DIAGNOSIS — G894 Chronic pain syndrome: Secondary | ICD-10-CM | POA: Diagnosis not present

## 2020-02-29 DIAGNOSIS — F1120 Opioid dependence, uncomplicated: Secondary | ICD-10-CM | POA: Diagnosis not present

## 2020-02-29 DIAGNOSIS — M545 Low back pain, unspecified: Secondary | ICD-10-CM | POA: Diagnosis not present

## 2020-02-29 DIAGNOSIS — F319 Bipolar disorder, unspecified: Secondary | ICD-10-CM | POA: Diagnosis not present

## 2020-02-29 DIAGNOSIS — E669 Obesity, unspecified: Secondary | ICD-10-CM | POA: Diagnosis not present

## 2020-02-29 DIAGNOSIS — M5441 Lumbago with sciatica, right side: Secondary | ICD-10-CM | POA: Diagnosis not present

## 2020-03-10 DIAGNOSIS — G4733 Obstructive sleep apnea (adult) (pediatric): Secondary | ICD-10-CM | POA: Diagnosis not present

## 2020-03-15 DIAGNOSIS — F1120 Opioid dependence, uncomplicated: Secondary | ICD-10-CM | POA: Diagnosis not present

## 2020-03-15 DIAGNOSIS — Z79891 Long term (current) use of opiate analgesic: Secondary | ICD-10-CM | POA: Diagnosis not present

## 2020-03-17 DIAGNOSIS — Z20822 Contact with and (suspected) exposure to covid-19: Secondary | ICD-10-CM | POA: Diagnosis not present

## 2020-03-21 DIAGNOSIS — F319 Bipolar disorder, unspecified: Secondary | ICD-10-CM | POA: Diagnosis not present

## 2020-03-21 DIAGNOSIS — G894 Chronic pain syndrome: Secondary | ICD-10-CM | POA: Diagnosis not present

## 2020-03-21 DIAGNOSIS — M545 Low back pain, unspecified: Secondary | ICD-10-CM | POA: Diagnosis not present

## 2020-03-21 DIAGNOSIS — M5441 Lumbago with sciatica, right side: Secondary | ICD-10-CM | POA: Diagnosis not present

## 2020-03-21 DIAGNOSIS — F1120 Opioid dependence, uncomplicated: Secondary | ICD-10-CM | POA: Diagnosis not present

## 2020-03-21 DIAGNOSIS — E669 Obesity, unspecified: Secondary | ICD-10-CM | POA: Diagnosis not present

## 2020-03-21 DIAGNOSIS — F419 Anxiety disorder, unspecified: Secondary | ICD-10-CM | POA: Diagnosis not present

## 2020-04-01 DIAGNOSIS — E1140 Type 2 diabetes mellitus with diabetic neuropathy, unspecified: Secondary | ICD-10-CM | POA: Diagnosis not present

## 2020-04-01 DIAGNOSIS — E782 Mixed hyperlipidemia: Secondary | ICD-10-CM | POA: Diagnosis not present

## 2020-04-01 DIAGNOSIS — Z23 Encounter for immunization: Secondary | ICD-10-CM | POA: Diagnosis not present

## 2020-04-01 DIAGNOSIS — I4891 Unspecified atrial fibrillation: Secondary | ICD-10-CM | POA: Diagnosis not present

## 2020-04-01 DIAGNOSIS — I10 Essential (primary) hypertension: Secondary | ICD-10-CM | POA: Diagnosis not present

## 2020-04-01 DIAGNOSIS — E039 Hypothyroidism, unspecified: Secondary | ICD-10-CM | POA: Diagnosis not present

## 2020-04-01 DIAGNOSIS — G4730 Sleep apnea, unspecified: Secondary | ICD-10-CM | POA: Diagnosis not present

## 2020-04-04 ENCOUNTER — Ambulatory Visit
Admission: RE | Admit: 2020-04-04 | Discharge: 2020-04-04 | Disposition: A | Discharge status: Home / Self Care | Payer: PPO | Source: Ambulatory Visit | Attending: Internal Medicine | Admitting: Internal Medicine

## 2020-04-04 ENCOUNTER — Other Ambulatory Visit: Payer: Self-pay

## 2020-04-04 DIAGNOSIS — N6315 Unspecified lump in the right breast, overlapping quadrants: Secondary | ICD-10-CM | POA: Diagnosis not present

## 2020-04-04 DIAGNOSIS — N6310 Unspecified lump in the right breast, unspecified quadrant: Secondary | ICD-10-CM | POA: Diagnosis present

## 2020-04-04 DIAGNOSIS — R928 Other abnormal and inconclusive findings on diagnostic imaging of breast: Secondary | ICD-10-CM | POA: Diagnosis not present

## 2020-04-05 ENCOUNTER — Other Ambulatory Visit: Payer: Self-pay | Admitting: Internal Medicine

## 2020-04-05 DIAGNOSIS — N6310 Unspecified lump in the right breast, unspecified quadrant: Secondary | ICD-10-CM

## 2020-04-13 DIAGNOSIS — Z79891 Long term (current) use of opiate analgesic: Secondary | ICD-10-CM | POA: Diagnosis not present

## 2020-04-13 DIAGNOSIS — F1120 Opioid dependence, uncomplicated: Secondary | ICD-10-CM | POA: Diagnosis not present

## 2020-04-19 DIAGNOSIS — F419 Anxiety disorder, unspecified: Secondary | ICD-10-CM | POA: Diagnosis not present

## 2020-04-19 DIAGNOSIS — E669 Obesity, unspecified: Secondary | ICD-10-CM | POA: Diagnosis not present

## 2020-04-19 DIAGNOSIS — G894 Chronic pain syndrome: Secondary | ICD-10-CM | POA: Diagnosis not present

## 2020-04-19 DIAGNOSIS — F1120 Opioid dependence, uncomplicated: Secondary | ICD-10-CM | POA: Diagnosis not present

## 2020-04-19 DIAGNOSIS — M5441 Lumbago with sciatica, right side: Secondary | ICD-10-CM | POA: Diagnosis not present

## 2020-04-19 DIAGNOSIS — F319 Bipolar disorder, unspecified: Secondary | ICD-10-CM | POA: Diagnosis not present

## 2020-04-22 DIAGNOSIS — E119 Type 2 diabetes mellitus without complications: Secondary | ICD-10-CM | POA: Diagnosis not present

## 2020-04-22 DIAGNOSIS — I4891 Unspecified atrial fibrillation: Secondary | ICD-10-CM | POA: Diagnosis not present

## 2020-04-22 DIAGNOSIS — E782 Mixed hyperlipidemia: Secondary | ICD-10-CM | POA: Diagnosis not present

## 2020-04-22 DIAGNOSIS — I350 Nonrheumatic aortic (valve) stenosis: Secondary | ICD-10-CM | POA: Diagnosis not present

## 2020-04-22 DIAGNOSIS — I10 Essential (primary) hypertension: Secondary | ICD-10-CM | POA: Diagnosis not present

## 2020-04-26 DIAGNOSIS — F1120 Opioid dependence, uncomplicated: Secondary | ICD-10-CM | POA: Diagnosis not present

## 2020-04-26 DIAGNOSIS — Z79891 Long term (current) use of opiate analgesic: Secondary | ICD-10-CM | POA: Diagnosis not present

## 2020-05-16 DIAGNOSIS — F319 Bipolar disorder, unspecified: Secondary | ICD-10-CM | POA: Diagnosis not present

## 2020-05-16 DIAGNOSIS — E669 Obesity, unspecified: Secondary | ICD-10-CM | POA: Diagnosis not present

## 2020-05-16 DIAGNOSIS — M545 Low back pain, unspecified: Secondary | ICD-10-CM | POA: Diagnosis not present

## 2020-05-16 DIAGNOSIS — F1120 Opioid dependence, uncomplicated: Secondary | ICD-10-CM | POA: Diagnosis not present

## 2020-05-16 DIAGNOSIS — F419 Anxiety disorder, unspecified: Secondary | ICD-10-CM | POA: Diagnosis not present

## 2020-05-16 DIAGNOSIS — G894 Chronic pain syndrome: Secondary | ICD-10-CM | POA: Diagnosis not present

## 2020-05-16 DIAGNOSIS — M5441 Lumbago with sciatica, right side: Secondary | ICD-10-CM | POA: Diagnosis not present

## 2020-05-26 DIAGNOSIS — F1120 Opioid dependence, uncomplicated: Secondary | ICD-10-CM | POA: Diagnosis not present

## 2020-05-26 DIAGNOSIS — Z79891 Long term (current) use of opiate analgesic: Secondary | ICD-10-CM | POA: Diagnosis not present

## 2020-06-09 DIAGNOSIS — G4733 Obstructive sleep apnea (adult) (pediatric): Secondary | ICD-10-CM | POA: Diagnosis not present

## 2020-06-13 DIAGNOSIS — M545 Low back pain, unspecified: Secondary | ICD-10-CM | POA: Diagnosis not present

## 2020-06-13 DIAGNOSIS — F1120 Opioid dependence, uncomplicated: Secondary | ICD-10-CM | POA: Diagnosis not present

## 2020-06-13 DIAGNOSIS — M5441 Lumbago with sciatica, right side: Secondary | ICD-10-CM | POA: Diagnosis not present

## 2020-06-13 DIAGNOSIS — F319 Bipolar disorder, unspecified: Secondary | ICD-10-CM | POA: Diagnosis not present

## 2020-06-13 DIAGNOSIS — F419 Anxiety disorder, unspecified: Secondary | ICD-10-CM | POA: Diagnosis not present

## 2020-06-13 DIAGNOSIS — G894 Chronic pain syndrome: Secondary | ICD-10-CM | POA: Diagnosis not present

## 2020-06-13 DIAGNOSIS — E669 Obesity, unspecified: Secondary | ICD-10-CM | POA: Diagnosis not present

## 2020-06-13 DIAGNOSIS — G8929 Other chronic pain: Secondary | ICD-10-CM | POA: Diagnosis not present

## 2020-06-15 DIAGNOSIS — Z79891 Long term (current) use of opiate analgesic: Secondary | ICD-10-CM | POA: Diagnosis not present

## 2020-06-15 DIAGNOSIS — F1120 Opioid dependence, uncomplicated: Secondary | ICD-10-CM | POA: Diagnosis not present

## 2020-06-30 DIAGNOSIS — D519 Vitamin B12 deficiency anemia, unspecified: Secondary | ICD-10-CM | POA: Diagnosis not present

## 2020-06-30 DIAGNOSIS — G4733 Obstructive sleep apnea (adult) (pediatric): Secondary | ICD-10-CM | POA: Diagnosis not present

## 2020-06-30 DIAGNOSIS — E559 Vitamin D deficiency, unspecified: Secondary | ICD-10-CM | POA: Diagnosis not present

## 2020-06-30 DIAGNOSIS — E039 Hypothyroidism, unspecified: Secondary | ICD-10-CM | POA: Diagnosis not present

## 2020-06-30 DIAGNOSIS — E782 Mixed hyperlipidemia: Secondary | ICD-10-CM | POA: Diagnosis not present

## 2020-06-30 DIAGNOSIS — J301 Allergic rhinitis due to pollen: Secondary | ICD-10-CM | POA: Diagnosis not present

## 2020-06-30 DIAGNOSIS — I10 Essential (primary) hypertension: Secondary | ICD-10-CM | POA: Diagnosis not present

## 2020-06-30 DIAGNOSIS — E119 Type 2 diabetes mellitus without complications: Secondary | ICD-10-CM | POA: Diagnosis not present

## 2020-07-11 DIAGNOSIS — F319 Bipolar disorder, unspecified: Secondary | ICD-10-CM | POA: Diagnosis not present

## 2020-07-11 DIAGNOSIS — E669 Obesity, unspecified: Secondary | ICD-10-CM | POA: Diagnosis not present

## 2020-07-11 DIAGNOSIS — M545 Low back pain, unspecified: Secondary | ICD-10-CM | POA: Diagnosis not present

## 2020-07-11 DIAGNOSIS — M5441 Lumbago with sciatica, right side: Secondary | ICD-10-CM | POA: Diagnosis not present

## 2020-07-11 DIAGNOSIS — F419 Anxiety disorder, unspecified: Secondary | ICD-10-CM | POA: Diagnosis not present

## 2020-07-11 DIAGNOSIS — G894 Chronic pain syndrome: Secondary | ICD-10-CM | POA: Diagnosis not present

## 2020-07-11 DIAGNOSIS — G8929 Other chronic pain: Secondary | ICD-10-CM | POA: Diagnosis not present

## 2020-07-11 DIAGNOSIS — F1120 Opioid dependence, uncomplicated: Secondary | ICD-10-CM | POA: Diagnosis not present

## 2020-08-02 DIAGNOSIS — Z79891 Long term (current) use of opiate analgesic: Secondary | ICD-10-CM | POA: Diagnosis not present

## 2020-08-02 DIAGNOSIS — F1120 Opioid dependence, uncomplicated: Secondary | ICD-10-CM | POA: Diagnosis not present

## 2020-08-08 DIAGNOSIS — G8929 Other chronic pain: Secondary | ICD-10-CM | POA: Diagnosis not present

## 2020-08-08 DIAGNOSIS — F1120 Opioid dependence, uncomplicated: Secondary | ICD-10-CM | POA: Diagnosis not present

## 2020-08-08 DIAGNOSIS — M545 Low back pain, unspecified: Secondary | ICD-10-CM | POA: Diagnosis not present

## 2020-08-08 DIAGNOSIS — F319 Bipolar disorder, unspecified: Secondary | ICD-10-CM | POA: Diagnosis not present

## 2020-08-08 DIAGNOSIS — F419 Anxiety disorder, unspecified: Secondary | ICD-10-CM | POA: Diagnosis not present

## 2020-08-08 DIAGNOSIS — E669 Obesity, unspecified: Secondary | ICD-10-CM | POA: Diagnosis not present

## 2020-08-08 DIAGNOSIS — M5441 Lumbago with sciatica, right side: Secondary | ICD-10-CM | POA: Diagnosis not present

## 2020-08-08 DIAGNOSIS — G894 Chronic pain syndrome: Secondary | ICD-10-CM | POA: Diagnosis not present

## 2020-08-10 DIAGNOSIS — Z79891 Long term (current) use of opiate analgesic: Secondary | ICD-10-CM | POA: Diagnosis not present

## 2020-08-10 DIAGNOSIS — E669 Obesity, unspecified: Secondary | ICD-10-CM | POA: Diagnosis not present

## 2020-08-10 DIAGNOSIS — M545 Low back pain, unspecified: Secondary | ICD-10-CM | POA: Diagnosis not present

## 2020-08-10 DIAGNOSIS — G8929 Other chronic pain: Secondary | ICD-10-CM | POA: Diagnosis not present

## 2020-08-10 DIAGNOSIS — G894 Chronic pain syndrome: Secondary | ICD-10-CM | POA: Diagnosis not present

## 2020-08-10 DIAGNOSIS — F319 Bipolar disorder, unspecified: Secondary | ICD-10-CM | POA: Diagnosis not present

## 2020-08-10 DIAGNOSIS — M5441 Lumbago with sciatica, right side: Secondary | ICD-10-CM | POA: Diagnosis not present

## 2020-08-10 DIAGNOSIS — F419 Anxiety disorder, unspecified: Secondary | ICD-10-CM | POA: Diagnosis not present

## 2020-08-10 DIAGNOSIS — F1120 Opioid dependence, uncomplicated: Secondary | ICD-10-CM | POA: Diagnosis not present

## 2020-09-07 DIAGNOSIS — F319 Bipolar disorder, unspecified: Secondary | ICD-10-CM | POA: Diagnosis not present

## 2020-09-07 DIAGNOSIS — G894 Chronic pain syndrome: Secondary | ICD-10-CM | POA: Diagnosis not present

## 2020-09-07 DIAGNOSIS — E669 Obesity, unspecified: Secondary | ICD-10-CM | POA: Diagnosis not present

## 2020-09-07 DIAGNOSIS — F419 Anxiety disorder, unspecified: Secondary | ICD-10-CM | POA: Diagnosis not present

## 2020-09-07 DIAGNOSIS — M545 Low back pain, unspecified: Secondary | ICD-10-CM | POA: Diagnosis not present

## 2020-09-07 DIAGNOSIS — Z79891 Long term (current) use of opiate analgesic: Secondary | ICD-10-CM | POA: Diagnosis not present

## 2020-09-07 DIAGNOSIS — F1120 Opioid dependence, uncomplicated: Secondary | ICD-10-CM | POA: Diagnosis not present

## 2020-09-07 DIAGNOSIS — M5441 Lumbago with sciatica, right side: Secondary | ICD-10-CM | POA: Diagnosis not present

## 2020-09-07 DIAGNOSIS — G8929 Other chronic pain: Secondary | ICD-10-CM | POA: Diagnosis not present

## 2020-09-09 DIAGNOSIS — G4733 Obstructive sleep apnea (adult) (pediatric): Secondary | ICD-10-CM | POA: Diagnosis not present

## 2020-09-29 DIAGNOSIS — E1140 Type 2 diabetes mellitus with diabetic neuropathy, unspecified: Secondary | ICD-10-CM | POA: Diagnosis not present

## 2020-09-29 DIAGNOSIS — I10 Essential (primary) hypertension: Secondary | ICD-10-CM | POA: Diagnosis not present

## 2020-09-29 DIAGNOSIS — F319 Bipolar disorder, unspecified: Secondary | ICD-10-CM | POA: Diagnosis not present

## 2020-09-29 DIAGNOSIS — E782 Mixed hyperlipidemia: Secondary | ICD-10-CM | POA: Diagnosis not present

## 2020-09-29 DIAGNOSIS — G4730 Sleep apnea, unspecified: Secondary | ICD-10-CM | POA: Diagnosis not present

## 2020-10-14 DIAGNOSIS — G4730 Sleep apnea, unspecified: Secondary | ICD-10-CM | POA: Diagnosis not present

## 2020-10-14 DIAGNOSIS — E1140 Type 2 diabetes mellitus with diabetic neuropathy, unspecified: Secondary | ICD-10-CM | POA: Diagnosis not present

## 2020-10-14 DIAGNOSIS — I10 Essential (primary) hypertension: Secondary | ICD-10-CM | POA: Diagnosis not present

## 2020-10-14 DIAGNOSIS — E782 Mixed hyperlipidemia: Secondary | ICD-10-CM | POA: Diagnosis not present

## 2020-10-14 DIAGNOSIS — M2550 Pain in unspecified joint: Secondary | ICD-10-CM | POA: Diagnosis not present

## 2020-10-14 DIAGNOSIS — F319 Bipolar disorder, unspecified: Secondary | ICD-10-CM | POA: Diagnosis not present

## 2020-10-19 DIAGNOSIS — F319 Bipolar disorder, unspecified: Secondary | ICD-10-CM | POA: Diagnosis not present

## 2020-10-19 DIAGNOSIS — E669 Obesity, unspecified: Secondary | ICD-10-CM | POA: Diagnosis not present

## 2020-10-19 DIAGNOSIS — F1120 Opioid dependence, uncomplicated: Secondary | ICD-10-CM | POA: Diagnosis not present

## 2020-10-19 DIAGNOSIS — G894 Chronic pain syndrome: Secondary | ICD-10-CM | POA: Diagnosis not present

## 2020-10-19 DIAGNOSIS — F419 Anxiety disorder, unspecified: Secondary | ICD-10-CM | POA: Diagnosis not present

## 2020-10-19 DIAGNOSIS — Z79891 Long term (current) use of opiate analgesic: Secondary | ICD-10-CM | POA: Diagnosis not present

## 2020-10-19 DIAGNOSIS — M5441 Lumbago with sciatica, right side: Secondary | ICD-10-CM | POA: Diagnosis not present

## 2020-10-19 DIAGNOSIS — M545 Low back pain, unspecified: Secondary | ICD-10-CM | POA: Diagnosis not present

## 2020-11-23 DIAGNOSIS — F1120 Opioid dependence, uncomplicated: Secondary | ICD-10-CM | POA: Diagnosis not present

## 2020-11-23 DIAGNOSIS — Z79899 Other long term (current) drug therapy: Secondary | ICD-10-CM | POA: Diagnosis not present

## 2020-11-23 DIAGNOSIS — M5441 Lumbago with sciatica, right side: Secondary | ICD-10-CM | POA: Diagnosis not present

## 2020-11-23 DIAGNOSIS — M545 Low back pain, unspecified: Secondary | ICD-10-CM | POA: Diagnosis not present

## 2020-11-23 DIAGNOSIS — F419 Anxiety disorder, unspecified: Secondary | ICD-10-CM | POA: Diagnosis not present

## 2020-11-23 DIAGNOSIS — E669 Obesity, unspecified: Secondary | ICD-10-CM | POA: Diagnosis not present

## 2020-11-23 DIAGNOSIS — G894 Chronic pain syndrome: Secondary | ICD-10-CM | POA: Diagnosis not present

## 2020-11-23 DIAGNOSIS — F319 Bipolar disorder, unspecified: Secondary | ICD-10-CM | POA: Diagnosis not present

## 2020-12-12 DIAGNOSIS — G4733 Obstructive sleep apnea (adult) (pediatric): Secondary | ICD-10-CM | POA: Diagnosis not present

## 2021-01-04 DIAGNOSIS — Z79891 Long term (current) use of opiate analgesic: Secondary | ICD-10-CM | POA: Diagnosis not present

## 2021-01-04 DIAGNOSIS — E669 Obesity, unspecified: Secondary | ICD-10-CM | POA: Diagnosis not present

## 2021-01-04 DIAGNOSIS — M5441 Lumbago with sciatica, right side: Secondary | ICD-10-CM | POA: Diagnosis not present

## 2021-01-04 DIAGNOSIS — F319 Bipolar disorder, unspecified: Secondary | ICD-10-CM | POA: Diagnosis not present

## 2021-01-04 DIAGNOSIS — F419 Anxiety disorder, unspecified: Secondary | ICD-10-CM | POA: Diagnosis not present

## 2021-01-04 DIAGNOSIS — F1120 Opioid dependence, uncomplicated: Secondary | ICD-10-CM | POA: Diagnosis not present

## 2021-01-04 DIAGNOSIS — M545 Low back pain, unspecified: Secondary | ICD-10-CM | POA: Diagnosis not present

## 2021-01-04 DIAGNOSIS — G894 Chronic pain syndrome: Secondary | ICD-10-CM | POA: Diagnosis not present

## 2021-01-09 DIAGNOSIS — G894 Chronic pain syndrome: Secondary | ICD-10-CM | POA: Diagnosis not present

## 2021-01-13 DIAGNOSIS — F319 Bipolar disorder, unspecified: Secondary | ICD-10-CM | POA: Diagnosis not present

## 2021-01-13 DIAGNOSIS — Z23 Encounter for immunization: Secondary | ICD-10-CM | POA: Diagnosis not present

## 2021-01-13 DIAGNOSIS — E782 Mixed hyperlipidemia: Secondary | ICD-10-CM | POA: Diagnosis not present

## 2021-01-13 DIAGNOSIS — E1140 Type 2 diabetes mellitus with diabetic neuropathy, unspecified: Secondary | ICD-10-CM | POA: Diagnosis not present

## 2021-01-13 DIAGNOSIS — G4730 Sleep apnea, unspecified: Secondary | ICD-10-CM | POA: Diagnosis not present

## 2021-01-13 DIAGNOSIS — D519 Vitamin B12 deficiency anemia, unspecified: Secondary | ICD-10-CM | POA: Diagnosis not present

## 2021-01-13 DIAGNOSIS — I10 Essential (primary) hypertension: Secondary | ICD-10-CM | POA: Diagnosis not present

## 2021-02-20 DIAGNOSIS — G8929 Other chronic pain: Secondary | ICD-10-CM | POA: Diagnosis not present

## 2021-02-20 DIAGNOSIS — M5441 Lumbago with sciatica, right side: Secondary | ICD-10-CM | POA: Diagnosis not present

## 2021-02-20 DIAGNOSIS — F1120 Opioid dependence, uncomplicated: Secondary | ICD-10-CM | POA: Diagnosis not present

## 2021-02-20 DIAGNOSIS — F419 Anxiety disorder, unspecified: Secondary | ICD-10-CM | POA: Diagnosis not present

## 2021-02-20 DIAGNOSIS — G894 Chronic pain syndrome: Secondary | ICD-10-CM | POA: Diagnosis not present

## 2021-02-20 DIAGNOSIS — E669 Obesity, unspecified: Secondary | ICD-10-CM | POA: Diagnosis not present

## 2021-02-20 DIAGNOSIS — F319 Bipolar disorder, unspecified: Secondary | ICD-10-CM | POA: Diagnosis not present

## 2021-02-20 DIAGNOSIS — M545 Low back pain, unspecified: Secondary | ICD-10-CM | POA: Diagnosis not present

## 2021-03-14 DIAGNOSIS — G4733 Obstructive sleep apnea (adult) (pediatric): Secondary | ICD-10-CM | POA: Diagnosis not present

## 2021-04-03 DIAGNOSIS — F1120 Opioid dependence, uncomplicated: Secondary | ICD-10-CM | POA: Diagnosis not present

## 2021-04-03 DIAGNOSIS — E669 Obesity, unspecified: Secondary | ICD-10-CM | POA: Diagnosis not present

## 2021-04-03 DIAGNOSIS — G8929 Other chronic pain: Secondary | ICD-10-CM | POA: Diagnosis not present

## 2021-04-03 DIAGNOSIS — M545 Low back pain, unspecified: Secondary | ICD-10-CM | POA: Diagnosis not present

## 2021-04-03 DIAGNOSIS — F310 Bipolar disorder, current episode hypomanic: Secondary | ICD-10-CM | POA: Diagnosis not present

## 2021-04-03 DIAGNOSIS — F419 Anxiety disorder, unspecified: Secondary | ICD-10-CM | POA: Diagnosis not present

## 2021-04-03 DIAGNOSIS — M5441 Lumbago with sciatica, right side: Secondary | ICD-10-CM | POA: Diagnosis not present

## 2021-04-03 DIAGNOSIS — G894 Chronic pain syndrome: Secondary | ICD-10-CM | POA: Diagnosis not present

## 2021-04-14 DIAGNOSIS — E119 Type 2 diabetes mellitus without complications: Secondary | ICD-10-CM | POA: Diagnosis not present

## 2021-05-03 DIAGNOSIS — Z79891 Long term (current) use of opiate analgesic: Secondary | ICD-10-CM | POA: Diagnosis not present

## 2021-05-03 DIAGNOSIS — F1120 Opioid dependence, uncomplicated: Secondary | ICD-10-CM | POA: Diagnosis not present

## 2021-05-03 DIAGNOSIS — G894 Chronic pain syndrome: Secondary | ICD-10-CM | POA: Diagnosis not present

## 2021-05-03 DIAGNOSIS — M5441 Lumbago with sciatica, right side: Secondary | ICD-10-CM | POA: Diagnosis not present

## 2021-05-03 DIAGNOSIS — M545 Low back pain, unspecified: Secondary | ICD-10-CM | POA: Diagnosis not present

## 2021-05-03 DIAGNOSIS — G8929 Other chronic pain: Secondary | ICD-10-CM | POA: Diagnosis not present

## 2021-05-03 DIAGNOSIS — F419 Anxiety disorder, unspecified: Secondary | ICD-10-CM | POA: Diagnosis not present

## 2021-05-03 DIAGNOSIS — F319 Bipolar disorder, unspecified: Secondary | ICD-10-CM | POA: Diagnosis not present

## 2021-05-03 DIAGNOSIS — E669 Obesity, unspecified: Secondary | ICD-10-CM | POA: Diagnosis not present

## 2021-05-16 DIAGNOSIS — G4730 Sleep apnea, unspecified: Secondary | ICD-10-CM | POA: Diagnosis not present

## 2021-05-16 DIAGNOSIS — K219 Gastro-esophageal reflux disease without esophagitis: Secondary | ICD-10-CM | POA: Diagnosis not present

## 2021-05-16 DIAGNOSIS — D519 Vitamin B12 deficiency anemia, unspecified: Secondary | ICD-10-CM | POA: Diagnosis not present

## 2021-05-16 DIAGNOSIS — E1140 Type 2 diabetes mellitus with diabetic neuropathy, unspecified: Secondary | ICD-10-CM | POA: Diagnosis not present

## 2021-05-16 DIAGNOSIS — E782 Mixed hyperlipidemia: Secondary | ICD-10-CM | POA: Diagnosis not present

## 2021-05-16 DIAGNOSIS — E039 Hypothyroidism, unspecified: Secondary | ICD-10-CM | POA: Diagnosis not present

## 2021-05-16 DIAGNOSIS — I10 Essential (primary) hypertension: Secondary | ICD-10-CM | POA: Diagnosis not present

## 2021-06-14 DIAGNOSIS — G4733 Obstructive sleep apnea (adult) (pediatric): Secondary | ICD-10-CM | POA: Diagnosis not present

## 2021-06-26 DIAGNOSIS — M545 Low back pain, unspecified: Secondary | ICD-10-CM | POA: Diagnosis not present

## 2021-06-26 DIAGNOSIS — E669 Obesity, unspecified: Secondary | ICD-10-CM | POA: Diagnosis not present

## 2021-06-26 DIAGNOSIS — F419 Anxiety disorder, unspecified: Secondary | ICD-10-CM | POA: Diagnosis not present

## 2021-06-26 DIAGNOSIS — F1120 Opioid dependence, uncomplicated: Secondary | ICD-10-CM | POA: Diagnosis not present

## 2021-06-26 DIAGNOSIS — G894 Chronic pain syndrome: Secondary | ICD-10-CM | POA: Diagnosis not present

## 2021-06-26 DIAGNOSIS — Z79891 Long term (current) use of opiate analgesic: Secondary | ICD-10-CM | POA: Diagnosis not present

## 2021-06-26 DIAGNOSIS — G8929 Other chronic pain: Secondary | ICD-10-CM | POA: Diagnosis not present

## 2021-06-26 DIAGNOSIS — M5441 Lumbago with sciatica, right side: Secondary | ICD-10-CM | POA: Diagnosis not present

## 2021-06-26 DIAGNOSIS — F319 Bipolar disorder, unspecified: Secondary | ICD-10-CM | POA: Diagnosis not present

## 2021-07-31 DIAGNOSIS — F419 Anxiety disorder, unspecified: Secondary | ICD-10-CM | POA: Diagnosis not present

## 2021-07-31 DIAGNOSIS — G894 Chronic pain syndrome: Secondary | ICD-10-CM | POA: Diagnosis not present

## 2021-07-31 DIAGNOSIS — Z79899 Other long term (current) drug therapy: Secondary | ICD-10-CM | POA: Diagnosis not present

## 2021-07-31 DIAGNOSIS — M5441 Lumbago with sciatica, right side: Secondary | ICD-10-CM | POA: Diagnosis not present

## 2021-07-31 DIAGNOSIS — M545 Low back pain, unspecified: Secondary | ICD-10-CM | POA: Diagnosis not present

## 2021-07-31 DIAGNOSIS — F1120 Opioid dependence, uncomplicated: Secondary | ICD-10-CM | POA: Diagnosis not present

## 2021-07-31 DIAGNOSIS — Z79891 Long term (current) use of opiate analgesic: Secondary | ICD-10-CM | POA: Diagnosis not present

## 2021-07-31 DIAGNOSIS — F319 Bipolar disorder, unspecified: Secondary | ICD-10-CM | POA: Diagnosis not present

## 2021-07-31 DIAGNOSIS — G8929 Other chronic pain: Secondary | ICD-10-CM | POA: Diagnosis not present

## 2021-07-31 DIAGNOSIS — E669 Obesity, unspecified: Secondary | ICD-10-CM | POA: Diagnosis not present

## 2021-08-28 DIAGNOSIS — G8929 Other chronic pain: Secondary | ICD-10-CM | POA: Diagnosis not present

## 2021-08-28 DIAGNOSIS — M5441 Lumbago with sciatica, right side: Secondary | ICD-10-CM | POA: Diagnosis not present

## 2021-08-28 DIAGNOSIS — F419 Anxiety disorder, unspecified: Secondary | ICD-10-CM | POA: Diagnosis not present

## 2021-08-28 DIAGNOSIS — Z79891 Long term (current) use of opiate analgesic: Secondary | ICD-10-CM | POA: Diagnosis not present

## 2021-08-28 DIAGNOSIS — G894 Chronic pain syndrome: Secondary | ICD-10-CM | POA: Diagnosis not present

## 2021-08-28 DIAGNOSIS — F1120 Opioid dependence, uncomplicated: Secondary | ICD-10-CM | POA: Diagnosis not present

## 2021-08-28 DIAGNOSIS — M545 Low back pain, unspecified: Secondary | ICD-10-CM | POA: Diagnosis not present

## 2021-08-28 DIAGNOSIS — F319 Bipolar disorder, unspecified: Secondary | ICD-10-CM | POA: Diagnosis not present

## 2021-08-28 DIAGNOSIS — Z79899 Other long term (current) drug therapy: Secondary | ICD-10-CM | POA: Diagnosis not present

## 2021-08-28 DIAGNOSIS — E669 Obesity, unspecified: Secondary | ICD-10-CM | POA: Diagnosis not present

## 2021-08-28 DIAGNOSIS — B0223 Postherpetic polyneuropathy: Secondary | ICD-10-CM | POA: Diagnosis not present

## 2021-09-14 DIAGNOSIS — G4733 Obstructive sleep apnea (adult) (pediatric): Secondary | ICD-10-CM | POA: Diagnosis not present

## 2021-10-18 DIAGNOSIS — M5441 Lumbago with sciatica, right side: Secondary | ICD-10-CM | POA: Diagnosis not present

## 2021-10-18 DIAGNOSIS — F419 Anxiety disorder, unspecified: Secondary | ICD-10-CM | POA: Diagnosis not present

## 2021-10-18 DIAGNOSIS — E669 Obesity, unspecified: Secondary | ICD-10-CM | POA: Diagnosis not present

## 2021-10-18 DIAGNOSIS — B0229 Other postherpetic nervous system involvement: Secondary | ICD-10-CM | POA: Diagnosis not present

## 2021-10-18 DIAGNOSIS — Z79899 Other long term (current) drug therapy: Secondary | ICD-10-CM | POA: Diagnosis not present

## 2021-10-18 DIAGNOSIS — M545 Low back pain, unspecified: Secondary | ICD-10-CM | POA: Diagnosis not present

## 2021-10-18 DIAGNOSIS — Z79891 Long term (current) use of opiate analgesic: Secondary | ICD-10-CM | POA: Diagnosis not present

## 2021-10-18 DIAGNOSIS — F1120 Opioid dependence, uncomplicated: Secondary | ICD-10-CM | POA: Diagnosis not present

## 2021-10-18 DIAGNOSIS — B0223 Postherpetic polyneuropathy: Secondary | ICD-10-CM | POA: Diagnosis not present

## 2021-10-18 DIAGNOSIS — F319 Bipolar disorder, unspecified: Secondary | ICD-10-CM | POA: Diagnosis not present

## 2021-10-18 DIAGNOSIS — G8929 Other chronic pain: Secondary | ICD-10-CM | POA: Diagnosis not present

## 2021-10-18 DIAGNOSIS — G894 Chronic pain syndrome: Secondary | ICD-10-CM | POA: Diagnosis not present

## 2021-11-15 DIAGNOSIS — M5441 Lumbago with sciatica, right side: Secondary | ICD-10-CM | POA: Diagnosis not present

## 2021-11-15 DIAGNOSIS — E669 Obesity, unspecified: Secondary | ICD-10-CM | POA: Diagnosis not present

## 2021-11-15 DIAGNOSIS — G894 Chronic pain syndrome: Secondary | ICD-10-CM | POA: Diagnosis not present

## 2021-11-15 DIAGNOSIS — Z79899 Other long term (current) drug therapy: Secondary | ICD-10-CM | POA: Diagnosis not present

## 2021-11-15 DIAGNOSIS — F1120 Opioid dependence, uncomplicated: Secondary | ICD-10-CM | POA: Diagnosis not present

## 2021-11-15 DIAGNOSIS — M545 Low back pain, unspecified: Secondary | ICD-10-CM | POA: Diagnosis not present

## 2021-11-15 DIAGNOSIS — T503X5A Adverse effect of electrolytic, caloric and water-balance agents, initial encounter: Secondary | ICD-10-CM | POA: Diagnosis not present

## 2021-11-15 DIAGNOSIS — F419 Anxiety disorder, unspecified: Secondary | ICD-10-CM | POA: Diagnosis not present

## 2021-11-15 DIAGNOSIS — B0229 Other postherpetic nervous system involvement: Secondary | ICD-10-CM | POA: Diagnosis not present

## 2021-11-15 DIAGNOSIS — F319 Bipolar disorder, unspecified: Secondary | ICD-10-CM | POA: Diagnosis not present

## 2021-11-15 DIAGNOSIS — B0223 Postherpetic polyneuropathy: Secondary | ICD-10-CM | POA: Diagnosis not present

## 2021-11-15 DIAGNOSIS — Z79891 Long term (current) use of opiate analgesic: Secondary | ICD-10-CM | POA: Diagnosis not present

## 2021-11-15 DIAGNOSIS — G8929 Other chronic pain: Secondary | ICD-10-CM | POA: Diagnosis not present

## 2021-11-21 DIAGNOSIS — G4730 Sleep apnea, unspecified: Secondary | ICD-10-CM | POA: Diagnosis not present

## 2021-11-21 DIAGNOSIS — G629 Polyneuropathy, unspecified: Secondary | ICD-10-CM | POA: Diagnosis not present

## 2021-11-21 DIAGNOSIS — E1140 Type 2 diabetes mellitus with diabetic neuropathy, unspecified: Secondary | ICD-10-CM | POA: Diagnosis not present

## 2021-11-21 DIAGNOSIS — Z1231 Encounter for screening mammogram for malignant neoplasm of breast: Secondary | ICD-10-CM | POA: Diagnosis not present

## 2021-11-21 DIAGNOSIS — G43009 Migraine without aura, not intractable, without status migrainosus: Secondary | ICD-10-CM | POA: Diagnosis not present

## 2021-11-21 DIAGNOSIS — I10 Essential (primary) hypertension: Secondary | ICD-10-CM | POA: Diagnosis not present

## 2021-11-21 DIAGNOSIS — E782 Mixed hyperlipidemia: Secondary | ICD-10-CM | POA: Diagnosis not present

## 2021-11-21 DIAGNOSIS — Z0000 Encounter for general adult medical examination without abnormal findings: Secondary | ICD-10-CM | POA: Diagnosis not present

## 2021-11-29 ENCOUNTER — Other Ambulatory Visit: Payer: Self-pay | Admitting: Family

## 2021-11-29 DIAGNOSIS — N63 Unspecified lump in unspecified breast: Secondary | ICD-10-CM

## 2021-12-15 DIAGNOSIS — G4733 Obstructive sleep apnea (adult) (pediatric): Secondary | ICD-10-CM | POA: Diagnosis not present

## 2021-12-20 ENCOUNTER — Other Ambulatory Visit: Payer: PPO

## 2021-12-20 ENCOUNTER — Inpatient Hospital Stay: Admission: RE | Admit: 2021-12-20 | Payer: PPO | Source: Ambulatory Visit

## 2021-12-27 DIAGNOSIS — B0223 Postherpetic polyneuropathy: Secondary | ICD-10-CM | POA: Diagnosis not present

## 2021-12-27 DIAGNOSIS — F419 Anxiety disorder, unspecified: Secondary | ICD-10-CM | POA: Diagnosis not present

## 2021-12-27 DIAGNOSIS — F319 Bipolar disorder, unspecified: Secondary | ICD-10-CM | POA: Diagnosis not present

## 2021-12-27 DIAGNOSIS — K5903 Drug induced constipation: Secondary | ICD-10-CM | POA: Diagnosis not present

## 2021-12-27 DIAGNOSIS — E669 Obesity, unspecified: Secondary | ICD-10-CM | POA: Diagnosis not present

## 2021-12-27 DIAGNOSIS — Z79899 Other long term (current) drug therapy: Secondary | ICD-10-CM | POA: Diagnosis not present

## 2021-12-27 DIAGNOSIS — B0229 Other postherpetic nervous system involvement: Secondary | ICD-10-CM | POA: Diagnosis not present

## 2021-12-27 DIAGNOSIS — M545 Low back pain, unspecified: Secondary | ICD-10-CM | POA: Diagnosis not present

## 2021-12-27 DIAGNOSIS — G894 Chronic pain syndrome: Secondary | ICD-10-CM | POA: Diagnosis not present

## 2021-12-27 DIAGNOSIS — Z79891 Long term (current) use of opiate analgesic: Secondary | ICD-10-CM | POA: Diagnosis not present

## 2021-12-27 DIAGNOSIS — M5441 Lumbago with sciatica, right side: Secondary | ICD-10-CM | POA: Diagnosis not present

## 2021-12-27 DIAGNOSIS — F1120 Opioid dependence, uncomplicated: Secondary | ICD-10-CM | POA: Diagnosis not present

## 2021-12-27 DIAGNOSIS — G8929 Other chronic pain: Secondary | ICD-10-CM | POA: Diagnosis not present

## 2022-01-09 ENCOUNTER — Other Ambulatory Visit: Payer: PPO

## 2022-01-16 ENCOUNTER — Other Ambulatory Visit: Payer: PPO

## 2022-01-24 DIAGNOSIS — F1120 Opioid dependence, uncomplicated: Secondary | ICD-10-CM | POA: Diagnosis not present

## 2022-01-24 DIAGNOSIS — G894 Chronic pain syndrome: Secondary | ICD-10-CM | POA: Diagnosis not present

## 2022-01-24 DIAGNOSIS — E669 Obesity, unspecified: Secondary | ICD-10-CM | POA: Diagnosis not present

## 2022-01-24 DIAGNOSIS — B0223 Postherpetic polyneuropathy: Secondary | ICD-10-CM | POA: Diagnosis not present

## 2022-01-24 DIAGNOSIS — K5903 Drug induced constipation: Secondary | ICD-10-CM | POA: Diagnosis not present

## 2022-01-24 DIAGNOSIS — M545 Low back pain, unspecified: Secondary | ICD-10-CM | POA: Diagnosis not present

## 2022-01-24 DIAGNOSIS — F419 Anxiety disorder, unspecified: Secondary | ICD-10-CM | POA: Diagnosis not present

## 2022-01-24 DIAGNOSIS — B0229 Other postherpetic nervous system involvement: Secondary | ICD-10-CM | POA: Diagnosis not present

## 2022-01-24 DIAGNOSIS — M5441 Lumbago with sciatica, right side: Secondary | ICD-10-CM | POA: Diagnosis not present

## 2022-01-24 DIAGNOSIS — T503X5A Adverse effect of electrolytic, caloric and water-balance agents, initial encounter: Secondary | ICD-10-CM | POA: Diagnosis not present

## 2022-01-24 DIAGNOSIS — G8929 Other chronic pain: Secondary | ICD-10-CM | POA: Diagnosis not present

## 2022-01-24 DIAGNOSIS — F319 Bipolar disorder, unspecified: Secondary | ICD-10-CM | POA: Diagnosis not present

## 2022-02-26 DIAGNOSIS — F1120 Opioid dependence, uncomplicated: Secondary | ICD-10-CM | POA: Diagnosis not present

## 2022-02-26 DIAGNOSIS — E669 Obesity, unspecified: Secondary | ICD-10-CM | POA: Diagnosis not present

## 2022-02-26 DIAGNOSIS — B0229 Other postherpetic nervous system involvement: Secondary | ICD-10-CM | POA: Diagnosis not present

## 2022-02-26 DIAGNOSIS — F319 Bipolar disorder, unspecified: Secondary | ICD-10-CM | POA: Diagnosis not present

## 2022-02-26 DIAGNOSIS — G894 Chronic pain syndrome: Secondary | ICD-10-CM | POA: Diagnosis not present

## 2022-02-26 DIAGNOSIS — M545 Low back pain, unspecified: Secondary | ICD-10-CM | POA: Diagnosis not present

## 2022-02-26 DIAGNOSIS — G8929 Other chronic pain: Secondary | ICD-10-CM | POA: Diagnosis not present

## 2022-02-26 DIAGNOSIS — F419 Anxiety disorder, unspecified: Secondary | ICD-10-CM | POA: Diagnosis not present

## 2022-02-26 DIAGNOSIS — T503X5A Adverse effect of electrolytic, caloric and water-balance agents, initial encounter: Secondary | ICD-10-CM | POA: Diagnosis not present

## 2022-02-26 DIAGNOSIS — M5441 Lumbago with sciatica, right side: Secondary | ICD-10-CM | POA: Diagnosis not present

## 2022-02-26 DIAGNOSIS — B0223 Postherpetic polyneuropathy: Secondary | ICD-10-CM | POA: Diagnosis not present

## 2022-02-26 DIAGNOSIS — K5903 Drug induced constipation: Secondary | ICD-10-CM | POA: Diagnosis not present

## 2022-03-16 DIAGNOSIS — G4733 Obstructive sleep apnea (adult) (pediatric): Secondary | ICD-10-CM | POA: Diagnosis not present

## 2022-03-23 DIAGNOSIS — E559 Vitamin D deficiency, unspecified: Secondary | ICD-10-CM | POA: Diagnosis not present

## 2022-03-23 DIAGNOSIS — I10 Essential (primary) hypertension: Secondary | ICD-10-CM | POA: Diagnosis not present

## 2022-03-23 DIAGNOSIS — E782 Mixed hyperlipidemia: Secondary | ICD-10-CM | POA: Diagnosis not present

## 2022-03-23 DIAGNOSIS — Z23 Encounter for immunization: Secondary | ICD-10-CM | POA: Diagnosis not present

## 2022-03-23 DIAGNOSIS — D519 Vitamin B12 deficiency anemia, unspecified: Secondary | ICD-10-CM | POA: Diagnosis not present

## 2022-03-23 DIAGNOSIS — E1140 Type 2 diabetes mellitus with diabetic neuropathy, unspecified: Secondary | ICD-10-CM | POA: Diagnosis not present

## 2022-03-23 DIAGNOSIS — R3981 Functional urinary incontinence: Secondary | ICD-10-CM | POA: Diagnosis not present

## 2022-03-23 DIAGNOSIS — G4730 Sleep apnea, unspecified: Secondary | ICD-10-CM | POA: Diagnosis not present

## 2022-04-04 DIAGNOSIS — E669 Obesity, unspecified: Secondary | ICD-10-CM | POA: Diagnosis not present

## 2022-04-04 DIAGNOSIS — K5903 Drug induced constipation: Secondary | ICD-10-CM | POA: Diagnosis not present

## 2022-04-04 DIAGNOSIS — F1120 Opioid dependence, uncomplicated: Secondary | ICD-10-CM | POA: Diagnosis not present

## 2022-04-04 DIAGNOSIS — B0229 Other postherpetic nervous system involvement: Secondary | ICD-10-CM | POA: Diagnosis not present

## 2022-04-04 DIAGNOSIS — F419 Anxiety disorder, unspecified: Secondary | ICD-10-CM | POA: Diagnosis not present

## 2022-04-04 DIAGNOSIS — G894 Chronic pain syndrome: Secondary | ICD-10-CM | POA: Diagnosis not present

## 2022-04-04 DIAGNOSIS — M5441 Lumbago with sciatica, right side: Secondary | ICD-10-CM | POA: Diagnosis not present

## 2022-04-04 DIAGNOSIS — B0223 Postherpetic polyneuropathy: Secondary | ICD-10-CM | POA: Diagnosis not present

## 2022-04-04 DIAGNOSIS — F319 Bipolar disorder, unspecified: Secondary | ICD-10-CM | POA: Diagnosis not present

## 2022-04-04 DIAGNOSIS — Z79891 Long term (current) use of opiate analgesic: Secondary | ICD-10-CM | POA: Diagnosis not present

## 2022-04-12 DIAGNOSIS — E1140 Type 2 diabetes mellitus with diabetic neuropathy, unspecified: Secondary | ICD-10-CM | POA: Diagnosis not present

## 2022-04-12 DIAGNOSIS — R3981 Functional urinary incontinence: Secondary | ICD-10-CM | POA: Diagnosis not present

## 2022-04-12 DIAGNOSIS — N390 Urinary tract infection, site not specified: Secondary | ICD-10-CM | POA: Diagnosis not present

## 2022-04-12 DIAGNOSIS — R300 Dysuria: Secondary | ICD-10-CM | POA: Diagnosis not present

## 2022-04-12 LAB — PROTEIN / CREATININE RATIO, URINE
Albumin, U: 81.8
Creatinine, Urine: 45

## 2022-04-12 LAB — MICROALBUMIN / CREATININE URINE RATIO: Microalb Creat Ratio: 182

## 2022-05-23 DIAGNOSIS — F419 Anxiety disorder, unspecified: Secondary | ICD-10-CM | POA: Diagnosis not present

## 2022-05-23 DIAGNOSIS — G894 Chronic pain syndrome: Secondary | ICD-10-CM | POA: Diagnosis not present

## 2022-05-23 DIAGNOSIS — K5903 Drug induced constipation: Secondary | ICD-10-CM | POA: Diagnosis not present

## 2022-05-23 DIAGNOSIS — Z79891 Long term (current) use of opiate analgesic: Secondary | ICD-10-CM | POA: Diagnosis not present

## 2022-05-23 DIAGNOSIS — E669 Obesity, unspecified: Secondary | ICD-10-CM | POA: Diagnosis not present

## 2022-05-23 DIAGNOSIS — M5441 Lumbago with sciatica, right side: Secondary | ICD-10-CM | POA: Diagnosis not present

## 2022-05-23 DIAGNOSIS — B0229 Other postherpetic nervous system involvement: Secondary | ICD-10-CM | POA: Diagnosis not present

## 2022-05-23 DIAGNOSIS — F319 Bipolar disorder, unspecified: Secondary | ICD-10-CM | POA: Diagnosis not present

## 2022-05-23 DIAGNOSIS — F1120 Opioid dependence, uncomplicated: Secondary | ICD-10-CM | POA: Diagnosis not present

## 2022-05-23 DIAGNOSIS — B0223 Postherpetic polyneuropathy: Secondary | ICD-10-CM | POA: Diagnosis not present

## 2022-06-18 DIAGNOSIS — G4733 Obstructive sleep apnea (adult) (pediatric): Secondary | ICD-10-CM | POA: Diagnosis not present

## 2022-06-21 ENCOUNTER — Ambulatory Visit (INDEPENDENT_AMBULATORY_CARE_PROVIDER_SITE_OTHER): Payer: PPO | Admitting: Family

## 2022-06-21 ENCOUNTER — Encounter: Payer: Self-pay | Admitting: Family

## 2022-06-21 VITALS — BP 124/80 | HR 53 | Ht 62.0 in | Wt 284.0 lb

## 2022-06-21 DIAGNOSIS — E1165 Type 2 diabetes mellitus with hyperglycemia: Secondary | ICD-10-CM

## 2022-06-21 DIAGNOSIS — E782 Mixed hyperlipidemia: Secondary | ICD-10-CM | POA: Diagnosis not present

## 2022-06-21 DIAGNOSIS — E039 Hypothyroidism, unspecified: Secondary | ICD-10-CM

## 2022-06-21 DIAGNOSIS — E538 Deficiency of other specified B group vitamins: Secondary | ICD-10-CM | POA: Diagnosis not present

## 2022-06-21 DIAGNOSIS — I10 Essential (primary) hypertension: Secondary | ICD-10-CM | POA: Diagnosis not present

## 2022-06-21 DIAGNOSIS — E559 Vitamin D deficiency, unspecified: Secondary | ICD-10-CM

## 2022-06-21 LAB — POCT CBG (FASTING - GLUCOSE)-MANUAL ENTRY: Glucose Fasting, POC: 174 mg/dL — AB (ref 70–99)

## 2022-06-21 NOTE — Progress Notes (Signed)
Established Patient Office Visit  Subjective:  Patient ID: Barbara Spencer, female    DOB: 18-Nov-1955  Age: 67 y.o. MRN: [id number]  Chief Complaint  Patient presents with   Follow-up    3 month follow up   GI Problem    Patient is here today for her 3 months follow up.  She has been feeling poorly since last appointment.   She does have additional concerns to discuss today.  Labs are due today. She needs refills.   I have reviewed her active problem list, medication list, allergies, notes from last encounter, lab results for her appointment today.      GI Problem The primary symptoms include diarrhea. The illness began more than 7 days ago. The onset was sudden. The problem has not changed since onset. The illness is also significant for bloating. The illness does not include chills, anorexia, dysphagia, odynophagia, constipation, tenesmus, back pain or itching.    No other concerns at this time.   Past Medical History:  Diagnosis Date   Anxiety    managing well with medication   Aortic stenosis    mild   Bipolar disorder ([place])    DDD (degenerative disc disease), lumbar    back and hip   Diabetes mellitus, type II ([place])    type 2   Gastric ulcer 12/02/2015   Heart murmur    HTN (hypertension)    Hypothyroidism    Obesity    Seasonal affective disorder ([place])    under control   Sleep apnea    wears cpap   Stroke ([place])    x 2   Tachycardia    Thyroid disease     Past Surgical History:  Procedure Laterality Date   APPENDECTOMY     CARDIAC CATHETERIZATION     x3 -all clear   CESAREAN SECTION     CHOLECYSTECTOMY     COLONOSCOPY WITH PROPOFOL N/A 02/03/2015   Procedure: COLONOSCOPY WITH PROPOFOL;  Surgeon: Laurence Spates, MD;  [facility];  Service: Endoscopy;  Laterality: N/A;   COLONOSCOPY WITH PROPOFOL N/A 03/01/2016   Procedure: COLONOSCOPY WITH PROPOFOL;  Surgeon: Laurence Spates, MD;  [facility];  Service: Endoscopy;   Laterality: N/A;   ESOPHAGOGASTRODUODENOSCOPY (EGD) WITH PROPOFOL N/A 12/02/2015   Procedure: ESOPHAGOGASTRODUODENOSCOPY (EGD) WITH PROPOFOL;  Surgeon: Laurence Spates, MD;  Location: [facility];  Service: Endoscopy;  Laterality: N/A;   ESOPHAGOGASTRODUODENOSCOPY (EGD) WITH PROPOFOL N/A 03/01/2016   Procedure: ESOPHAGOGASTRODUODENOSCOPY (EGD) WITH PROPOFOL;  Surgeon: Laurence Spates, MD;  [facility];  Service: Endoscopy;  Laterality: N/A;   EUS N/A 04/11/2016   Procedure: UPPER ENDOSCOPIC ULTRASOUND (EUS) RADIAL;  Surgeon: Arta Silence, MD;  [facility];  Service: Endoscopy;  Laterality: N/A;   HERNIA REPAIR     abdominal hernia repair after gallbladder surgery   LAPAROSCOPIC PARTIAL GASTRECTOMY N/A 12/18/2016   Procedure: LAPAROSCOPIC PARTIAL GASTRECTOMY WITH ROUX-EN-Y RECONSTRUCTION;  Surgeon: Excell Seltzer, MD;  [facility];  Service: General;  Laterality: N/A;   PARTIAL GASTRECTOMY     12/18/16 Dr. Excell Seltzer    Social History   Socioeconomic History   Marital status: Married    Spouse name: Jeneen Rinks (jim)   Number of children: 5   Years of education: Not on file   Highest education level: High school graduate  Occupational History   Not on file  Tobacco Use   Smoking status: Former    Types: Cigarettes    Quit date: 09/02/2004    Years  since quitting: 17.8   Smokeless tobacco: Never  Vaping Use   Vaping Use: Never used  Substance and Sexual Activity   Alcohol use: Not Currently    Alcohol/week: 0.0 standard drinks of alcohol    Comment: occasional   Drug use: No   Sexual activity: Yes    Birth control/protection: None  Other Topics Concern   Not on file  Social History Narrative   Not on file   Social Determinants of Health   Financial Resource Strain: Low Risk  (07/31/2018)   Overall Financial Resource Strain (CARDIA)    Difficulty of Paying Living Expenses: Not hard at all  Food Insecurity: No Food Insecurity (07/31/2018)   Hunger Vital Sign     Worried About Running Out of Food in the Last Year: Never true    Ran Out of Food in the Last Year: Never true  Transportation Needs: No Transportation Needs (07/31/2018)   PRAPARE - Hydrologist (Medical): No    Lack of Transportation (Non-Medical): No  Physical Activity: Sufficiently Active (07/31/2018)   Exercise Vital Sign    Days of Exercise per Week: 5 days    Minutes of Exercise per Session: 60 min  Stress: No Stress Concern Present (07/31/2018)   [place]    Feeling of Stress : Not at all  Social Connections: Unknown (07/31/2018)   Social Connection and Isolation Panel [NHANES]    Frequency of Communication with Friends and Family: Not on file    Frequency of Social Gatherings with Friends and Family: Not on file    Attends Religious Services: 1 to 4 times per year    Active Member of [employer] or Organizations: No    Attends Archivist Meetings: Never    Marital Status: Married  Human resources officer Violence: Not At Risk (07/31/2018)   Humiliation, Afraid, Rape, and Kick questionnaire    Fear of Current or Ex-Partner: No    Emotionally Abused: No    Physically Abused: No    Sexually Abused: No    Family History  Problem Relation Age of Onset   Heart disease Mother    Diabetes Mother    Hypertension Mother    Heart disease Father    Dementia Father    Alcohol abuse Father    Breast cancer Cousin     Allergies  Allergen Reactions   Latex Hives   Sulfa Antibiotics Itching and Swelling   Tape Rash and Other (See Comments)    Paper tape ok to use per pt-Tegaderm reddens skin and Electrodes "ripped skin off and now have scars"    Review of Systems  Constitutional:  Negative for chills.  Gastrointestinal:  Positive for bloating and diarrhea. Negative for anorexia, constipation and dysphagia.  Musculoskeletal:  Negative for back pain.  Skin:  Negative for itching.        Objective:   BP 124/80   Pulse (!) 53   Ht 5\' 2"  (1.575 m)   Wt 284 lb (128.8 kg)   SpO2 92%   BMI 51.94 kg/m   Vitals:   06/21/22 1022  BP: 124/80  Pulse: (!) 53  Height: 5\' 2"  (1.575 m)  Weight: 284 lb (128.8 kg)  SpO2: 92%  BMI (Calculated): 51.93    Physical Exam Vitals and nursing note reviewed.  Constitutional:      General: She is not in acute distress.    Appearance: Normal appearance. She is obese.  HENT:     Head: Normocephalic.  Eyes:     Pupils: Pupils are equal, round, and reactive to light.  Cardiovascular:     Rate and Rhythm: Normal rate.  Pulmonary:     Effort: Pulmonary effort is normal.  Neurological:     Mental Status: She is alert.      Results for orders placed or performed in visit on 06/21/22  POCT CBG (Fasting - Glucose)  Result Value Ref Range   Glucose Fasting, POC 174 (A) 70 - 99 mg/dL    Recent Results (from the past 2160 hour(s))  POCT CBG (Fasting - Glucose)     Status: Abnormal   Collection Time: 06/21/22 10:27 AM  Result Value Ref Range   Glucose Fasting, POC 174 (A) 70 - 99 mg/dL      Assessment & Plan:   Problem List Items Addressed This Visit     Essential hypertension   Relevant Medications   losartan (COZAAR) 50 MG tablet   apixaban (ELIQUIS) 5 MG TABS tablet   Other Relevant Orders   CBC With Differential   CMP14+EGFR   HLD (hyperlipidemia)   Relevant Medications   losartan (COZAAR) 50 MG tablet   apixaban (ELIQUIS) 5 MG TABS tablet   Other Relevant Orders   Lipid panel   CBC With Differential   CMP14+EGFR   Hypothyroidism (acquired)   Relevant Orders   CBC With Differential   CMP14+EGFR   TSH   Other Visit Diagnoses     Type 2 diabetes mellitus with hyperglycemia, without long-term current use of insulin (HCC)    -  Primary   Relevant Medications   losartan (COZAAR) 50 MG tablet   FARXIGA 10 MG TABS tablet   TRESIBA FLEXTOUCH 200 UNIT/ML FlexTouch Pen   Other Relevant Orders   POCT CBG  (Fasting - Glucose) (Completed)   CBC With Differential   CMP14+EGFR   Hemoglobin A1c   Vitamin D deficiency, unspecified       Relevant Orders   VITAMIN D 25 Hydroxy (Vit-D Deficiency, Fractures)   CBC With Differential   CMP14+EGFR   B12 deficiency due to diet       Relevant Orders   CBC With Differential   CMP14+EGFR   Vitamin B12       Return in about 4 months (around 10/21/2022) for F/U.   Total time spent: 30 minutes  Mechele Claude, FNP  06/21/2022

## 2022-06-22 LAB — CMP14+EGFR
ALT: 11 IU/L (ref 0–32)
AST: 13 IU/L (ref 0–40)
Albumin/Globulin Ratio: 1.5 (ref 1.2–2.2)
Albumin: 3.9 g/dL (ref 3.9–4.9)
Alkaline Phosphatase: 62 IU/L (ref 44–121)
BUN/Creatinine Ratio: 15 (ref 12–28)
BUN: 19 mg/dL (ref 8–27)
Bilirubin Total: 0.5 mg/dL (ref 0.0–1.2)
CO2: 20 mmol/L (ref 20–29)
Calcium: 9.3 mg/dL (ref 8.7–10.3)
Chloride: 106 mmol/L (ref 96–106)
Creatinine, Ser: 1.3 mg/dL — ABNORMAL HIGH (ref 0.57–1.00)
Globulin, Total: 2.6 g/dL (ref 1.5–4.5)
Glucose: 165 mg/dL — ABNORMAL HIGH (ref 70–99)
Potassium: 4.9 mmol/L (ref 3.5–5.2)
Sodium: 143 mmol/L (ref 134–144)
Total Protein: 6.5 g/dL (ref 6.0–8.5)
eGFR: 45 mL/min/{1.73_m2} — ABNORMAL LOW (ref >59)

## 2022-06-22 LAB — CBC WITH DIFFERENTIAL
Basophils Absolute: 0.1 10*3/uL (ref 0.0–0.2)
Basos: 1 %
EOS (ABSOLUTE): 0.2 10*3/uL (ref 0.0–0.4)
Eos: 3 %
Hematocrit: 49.7 % — ABNORMAL HIGH (ref 34.0–46.6)
Hemoglobin: 16.3 g/dL — ABNORMAL HIGH (ref 11.1–15.9)
Immature Grans (Abs): 0 10*3/uL (ref 0.0–0.1)
Immature Granulocytes: 0 %
Lymphocytes Absolute: 2.1 10*3/uL (ref 0.7–3.1)
Lymphs: 29 %
MCH: 28.3 pg (ref 26.6–33.0)
MCHC: 32.8 g/dL (ref 31.5–35.7)
MCV: 86 fL (ref 79–97)
Monocytes Absolute: 0.6 10*3/uL (ref 0.1–0.9)
Monocytes: 8 %
Neutrophils Absolute: 4.3 10*3/uL (ref 1.4–7.0)
Neutrophils: 59 %
RBC: 5.75 x10E6/uL — ABNORMAL HIGH (ref 3.77–5.28)
RDW: 13.6 % (ref 11.7–15.4)
WBC: 7.2 10*3/uL (ref 3.4–10.8)

## 2022-06-22 LAB — TSH: TSH: 4.2 u[IU]/mL (ref 0.450–4.500)

## 2022-06-22 LAB — LIPID PANEL
Chol/HDL Ratio: 4 ratio (ref 0.0–4.4)
Cholesterol, Total: 147 mg/dL (ref 100–199)
HDL: 37 mg/dL — ABNORMAL LOW (ref >39)
LDL Chol Calc (NIH): 79 mg/dL (ref 0–99)
Triglycerides: 179 mg/dL — ABNORMAL HIGH (ref 0–149)
VLDL Cholesterol Cal: 31 mg/dL (ref 5–40)

## 2022-06-22 LAB — VITAMIN D 25 HYDROXY (VIT D DEFICIENCY, FRACTURES): Vit D, 25-Hydroxy: 35.8 ng/mL (ref 30.0–100.0)

## 2022-06-22 LAB — VITAMIN B12: Vitamin B-12: 511 pg/mL (ref 232–1245)

## 2022-06-22 LAB — HEMOGLOBIN A1C
Est. average glucose Bld gHb Est-mCnc: 212 mg/dL
Hgb A1c MFr Bld: 9 % — ABNORMAL HIGH (ref 4.8–5.6)

## 2022-06-26 ENCOUNTER — Other Ambulatory Visit: Payer: Self-pay | Admitting: Family

## 2022-06-27 DIAGNOSIS — B0223 Postherpetic polyneuropathy: Secondary | ICD-10-CM | POA: Diagnosis not present

## 2022-06-27 DIAGNOSIS — F319 Bipolar disorder, unspecified: Secondary | ICD-10-CM | POA: Diagnosis not present

## 2022-06-27 DIAGNOSIS — M5441 Lumbago with sciatica, right side: Secondary | ICD-10-CM | POA: Diagnosis not present

## 2022-06-27 DIAGNOSIS — E669 Obesity, unspecified: Secondary | ICD-10-CM | POA: Diagnosis not present

## 2022-06-27 DIAGNOSIS — M545 Low back pain, unspecified: Secondary | ICD-10-CM | POA: Diagnosis not present

## 2022-06-27 DIAGNOSIS — K5903 Drug induced constipation: Secondary | ICD-10-CM | POA: Diagnosis not present

## 2022-06-27 DIAGNOSIS — Z79891 Long term (current) use of opiate analgesic: Secondary | ICD-10-CM | POA: Diagnosis not present

## 2022-06-27 DIAGNOSIS — G894 Chronic pain syndrome: Secondary | ICD-10-CM | POA: Diagnosis not present

## 2022-06-27 DIAGNOSIS — F419 Anxiety disorder, unspecified: Secondary | ICD-10-CM | POA: Diagnosis not present

## 2022-06-27 DIAGNOSIS — B0229 Other postherpetic nervous system involvement: Secondary | ICD-10-CM | POA: Diagnosis not present

## 2022-06-27 DIAGNOSIS — F1120 Opioid dependence, uncomplicated: Secondary | ICD-10-CM | POA: Diagnosis not present

## 2022-07-04 ENCOUNTER — Other Ambulatory Visit: Payer: Self-pay | Admitting: Family

## 2022-08-20 DIAGNOSIS — T503X5A Adverse effect of electrolytic, caloric and water-balance agents, initial encounter: Secondary | ICD-10-CM | POA: Diagnosis not present

## 2022-08-20 DIAGNOSIS — K5903 Drug induced constipation: Secondary | ICD-10-CM | POA: Diagnosis not present

## 2022-08-20 DIAGNOSIS — F419 Anxiety disorder, unspecified: Secondary | ICD-10-CM | POA: Diagnosis not present

## 2022-08-20 DIAGNOSIS — B0223 Postherpetic polyneuropathy: Secondary | ICD-10-CM | POA: Diagnosis not present

## 2022-08-20 DIAGNOSIS — B0229 Other postherpetic nervous system involvement: Secondary | ICD-10-CM | POA: Diagnosis not present

## 2022-08-20 DIAGNOSIS — F319 Bipolar disorder, unspecified: Secondary | ICD-10-CM | POA: Diagnosis not present

## 2022-08-20 DIAGNOSIS — E669 Obesity, unspecified: Secondary | ICD-10-CM | POA: Diagnosis not present

## 2022-08-20 DIAGNOSIS — Z79891 Long term (current) use of opiate analgesic: Secondary | ICD-10-CM | POA: Diagnosis not present

## 2022-08-20 DIAGNOSIS — F1120 Opioid dependence, uncomplicated: Secondary | ICD-10-CM | POA: Diagnosis not present

## 2022-08-20 DIAGNOSIS — M5441 Lumbago with sciatica, right side: Secondary | ICD-10-CM | POA: Diagnosis not present

## 2022-08-20 DIAGNOSIS — G894 Chronic pain syndrome: Secondary | ICD-10-CM | POA: Diagnosis not present

## 2022-08-20 DIAGNOSIS — M545 Low back pain, unspecified: Secondary | ICD-10-CM | POA: Diagnosis not present

## 2022-08-25 ENCOUNTER — Other Ambulatory Visit: Payer: Self-pay | Admitting: Internal Medicine

## 2022-09-26 ENCOUNTER — Other Ambulatory Visit: Payer: Self-pay

## 2022-09-26 MED ORDER — APIXABAN 5 MG PO TABS: 5.0000 mg | ORAL_TABLET | Freq: Two times a day (BID) | ORAL | 1 refills | Status: DC

## 2022-09-29 ENCOUNTER — Other Ambulatory Visit: Payer: Self-pay | Admitting: Internal Medicine

## 2022-09-30 DIAGNOSIS — G4733 Obstructive sleep apnea (adult) (pediatric): Secondary | ICD-10-CM | POA: Diagnosis not present

## 2022-10-01 ENCOUNTER — Other Ambulatory Visit: Payer: Self-pay | Admitting: Family

## 2022-10-05 ENCOUNTER — Other Ambulatory Visit: Payer: Self-pay | Admitting: Family

## 2022-10-22 ENCOUNTER — Ambulatory Visit: Payer: PPO | Admitting: Family

## 2022-10-29 DIAGNOSIS — G894 Chronic pain syndrome: Secondary | ICD-10-CM | POA: Diagnosis not present

## 2022-10-29 DIAGNOSIS — F319 Bipolar disorder, unspecified: Secondary | ICD-10-CM | POA: Diagnosis not present

## 2022-10-29 DIAGNOSIS — E669 Obesity, unspecified: Secondary | ICD-10-CM | POA: Diagnosis not present

## 2022-10-29 DIAGNOSIS — F419 Anxiety disorder, unspecified: Secondary | ICD-10-CM | POA: Diagnosis not present

## 2022-10-29 DIAGNOSIS — M545 Low back pain, unspecified: Secondary | ICD-10-CM | POA: Diagnosis not present

## 2022-10-29 DIAGNOSIS — Z79891 Long term (current) use of opiate analgesic: Secondary | ICD-10-CM | POA: Diagnosis not present

## 2022-10-29 DIAGNOSIS — B0223 Postherpetic polyneuropathy: Secondary | ICD-10-CM | POA: Diagnosis not present

## 2022-10-29 DIAGNOSIS — B0229 Other postherpetic nervous system involvement: Secondary | ICD-10-CM | POA: Diagnosis not present

## 2022-10-29 DIAGNOSIS — F1120 Opioid dependence, uncomplicated: Secondary | ICD-10-CM | POA: Diagnosis not present

## 2022-10-29 DIAGNOSIS — K5903 Drug induced constipation: Secondary | ICD-10-CM | POA: Diagnosis not present

## 2022-10-29 DIAGNOSIS — T503X5A Adverse effect of electrolytic, caloric and water-balance agents, initial encounter: Secondary | ICD-10-CM | POA: Diagnosis not present

## 2022-10-29 DIAGNOSIS — M5441 Lumbago with sciatica, right side: Secondary | ICD-10-CM | POA: Diagnosis not present

## 2022-11-07 ENCOUNTER — Encounter: Payer: Self-pay | Admitting: Family

## 2022-11-07 ENCOUNTER — Ambulatory Visit (INDEPENDENT_AMBULATORY_CARE_PROVIDER_SITE_OTHER): Payer: PPO | Admitting: Family

## 2022-11-07 VITALS — BP 130/75 | HR 55 | Ht 62.0 in | Wt 280.2 lb

## 2022-11-07 DIAGNOSIS — E538 Deficiency of other specified B group vitamins: Secondary | ICD-10-CM | POA: Diagnosis not present

## 2022-11-07 DIAGNOSIS — F338 Other recurrent depressive disorders: Secondary | ICD-10-CM | POA: Diagnosis not present

## 2022-11-07 DIAGNOSIS — E782 Mixed hyperlipidemia: Secondary | ICD-10-CM

## 2022-11-07 DIAGNOSIS — E1142 Type 2 diabetes mellitus with diabetic polyneuropathy: Secondary | ICD-10-CM

## 2022-11-07 DIAGNOSIS — I10 Essential (primary) hypertension: Secondary | ICD-10-CM

## 2022-11-07 DIAGNOSIS — F3170 Bipolar disorder, currently in remission, most recent episode unspecified: Secondary | ICD-10-CM | POA: Diagnosis not present

## 2022-11-07 DIAGNOSIS — E039 Hypothyroidism, unspecified: Secondary | ICD-10-CM | POA: Diagnosis not present

## 2022-11-07 DIAGNOSIS — E1151 Type 2 diabetes mellitus with diabetic peripheral angiopathy without gangrene: Secondary | ICD-10-CM | POA: Diagnosis not present

## 2022-11-07 DIAGNOSIS — Z794 Long term (current) use of insulin: Secondary | ICD-10-CM | POA: Diagnosis not present

## 2022-11-07 DIAGNOSIS — I480 Paroxysmal atrial fibrillation: Secondary | ICD-10-CM | POA: Diagnosis not present

## 2022-11-07 DIAGNOSIS — E559 Vitamin D deficiency, unspecified: Secondary | ICD-10-CM | POA: Diagnosis not present

## 2022-11-07 DIAGNOSIS — I421 Obstructive hypertrophic cardiomyopathy: Secondary | ICD-10-CM | POA: Diagnosis not present

## 2022-11-07 DIAGNOSIS — Z6841 Body Mass Index (BMI) 40.0 and over, adult: Secondary | ICD-10-CM

## 2022-11-07 NOTE — Progress Notes (Signed)
Established Patient Office Visit  Subjective:  Patient ID: Barbara Spencer, female    DOB: [birth year]  Age: 67 y.o. MRN: [id number]  Chief Complaint  Patient presents with   Follow-up    4 mo f/u    Patient is here today for her 3 months follow up.  She has been feeling fairly well since last appointment.   She does have additional concerns to discuss today.  She has been having issues with her blood sugars, they have been dropping severely overnight.  Says that in the mornings it's been sitting between 50 and 80.   Labs are due today. She needs refills.   I have reviewed her active problem list, medication list, allergies, notes from last encounter, lab results for her appointment today.     No other concerns at this time.   Past Medical History:  Diagnosis Date   Anxiety    managing well with medication   Aortic stenosis    mild   Bipolar disorder (HCC)    DDD (degenerative disc disease), lumbar    back and hip   Diabetes mellitus, type II (HCC)    type 2   Gastric ulcer 12/02/2015   H/O: CVA (cerebrovascular accident) 08/03/2016   Heart murmur    HLD (hyperlipidemia) 08/03/2016   HTN (hypertension)    Hypothyroidism    Long term (current) use of insulin (HCC) 11/04/2018   Obesity    Seasonal affective disorder (HCC)    under control   Sleep apnea    wears cpap   Stroke (HCC)    x 2   Tachycardia    Thyroid disease     Past Surgical History:  Procedure Laterality Date   APPENDECTOMY     CARDIAC CATHETERIZATION     x3 -all clear   CESAREAN SECTION     CHOLECYSTECTOMY     COLONOSCOPY WITH PROPOFOL N/A 02/03/2015   Procedure: COLONOSCOPY WITH PROPOFOL;  Surgeon: Carman Ching, MD;  Location: [facility];  Service: Endoscopy;  Laterality: N/A;   COLONOSCOPY WITH PROPOFOL N/A 03/01/2016   Procedure: COLONOSCOPY WITH PROPOFOL;  Surgeon: Carman Ching, MD;  Location: [facility];  Service: Endoscopy;  Laterality: N/A;   ESOPHAGOGASTRODUODENOSCOPY  (EGD) WITH PROPOFOL N/A 12/02/2015   Procedure: ESOPHAGOGASTRODUODENOSCOPY (EGD) WITH PROPOFOL;  Surgeon: Carman Ching, MD;  Location: [facility] ENDOSCOPY;  Service: Endoscopy;  Laterality: N/A;   ESOPHAGOGASTRODUODENOSCOPY (EGD) WITH PROPOFOL N/A 03/01/2016   Procedure: ESOPHAGOGASTRODUODENOSCOPY (EGD) WITH PROPOFOL;  Surgeon: Carman Ching, MD;  Location: [facility];  Service: Endoscopy;  Laterality: N/A;   EUS N/A 04/11/2016   Procedure: UPPER ENDOSCOPIC ULTRASOUND (EUS) RADIAL;  Surgeon: Willis Modena, MD;  Location: [facility];  Service: Endoscopy;  Laterality: N/A;   HERNIA REPAIR     abdominal hernia repair after gallbladder surgery   LAPAROSCOPIC PARTIAL GASTRECTOMY N/A 12/18/2016   Procedure: LAPAROSCOPIC PARTIAL GASTRECTOMY WITH ROUX-EN-Y RECONSTRUCTION;  Surgeon: Glenna Fellows, MD;  Location: [facility];  Service: General;  Laterality: N/A;   PARTIAL GASTRECTOMY     12/18/16 Dr. Johna Sheriff    Social History   Socioeconomic History   Marital status: Married    Spouse name: Information systems manager (jim)   Number of children: 5   Years of education: Not on file   Highest education level: High school graduate  Occupational History   Not on file  Tobacco Use   Smoking status: Former    Current packs/day: 0.00    Types: Cigarettes    Quit date: 09/02/2004  Years since quitting: 18.1   Smokeless tobacco: Never  Vaping Use   Vaping status: Never Used  Substance and Sexual Activity   Alcohol use: Not Currently    Alcohol/week: 0.0 standard drinks of alcohol    Comment: occasional   Drug use: No   Sexual activity: Yes    Birth control/protection: None  Other Topics Concern   Not on file  Social History Narrative   Not on file   Social Determinants of Health   Financial Resource Strain: Low Risk  (07/31/2018)   Overall Financial Resource Strain (CARDIA)    Difficulty of Paying Living Expenses: Not hard at all  Food Insecurity: No Food Insecurity (07/31/2018)   Hunger Vital Sign    Worried  About Running Out of Food in the Last Year: Never true    Ran Out of Food in the Last Year: Never true  Transportation Needs: No Transportation Needs (07/31/2018)   PRAPARE - Administrator, Civil Service (Medical): No    Lack of Transportation (Non-Medical): No  Physical Activity: Sufficiently Active (07/31/2018)   Exercise Vital Sign    Days of Exercise per Week: 5 days    Minutes of Exercise per Session: 60 min  Stress: No Stress Concern Present (07/31/2018)   [employer] of Occupational Health - Occupational Stress Questionnaire    Feeling of Stress : Not at all  Social Connections: Unknown (07/31/2018)   Social Connection and Isolation Panel [NHANES]    Frequency of Communication with Friends and Family: Not on file    Frequency of Social Gatherings with Friends and Family: Not on file    Attends Religious Services: 1 to 4 times per year    Active Member of [employer] or Organizations: No    Attends Banker Meetings: Never    Marital Status: Married  Catering manager Violence: Not At Risk (07/31/2018)   Humiliation, Afraid, Rape, and Kick questionnaire    Fear of Current or Ex-Partner: No    Emotionally Abused: No    Physically Abused: No    Sexually Abused: No    Family History  Problem Relation Age of Onset   Heart disease Mother    Diabetes Mother    Hypertension Mother    Heart disease Father    Dementia Father    Alcohol abuse Father    Breast cancer Cousin     Allergies  Allergen Reactions   Latex Hives   Sulfa Antibiotics Itching and Swelling   Tape Rash and Other (See Comments)    Paper tape ok to use per pt-Tegaderm reddens skin and Electrodes "ripped skin off and now have scars"    ROS     Objective:   BP 130/75   Pulse (!) 55   Ht 5\' 2"  (1.575 m)   Wt 280 lb 3.2 oz (127.1 kg)   SpO2 93%   BMI 51.25 kg/m   Vitals:   11/07/22 1110  BP: 130/75  Pulse: (!) 55  Height: 5\' 2"  (1.575 m)  Weight: 280 lb 3.2 oz (127.1 kg)   SpO2: 93%  BMI (Calculated): 51.24    Physical Exam   No results found for any visits on 11/07/22.  No results found for this or any previous visit (from the past 2160 hour(s)).     Assessment & Plan:   Problem List Items Addressed This Visit       Active Problems   Seasonal affective disorder [facility])    Patient is seen  by Psychiatry, who manage this condition.  She is well controlled with current therapy.   Will defer to them for further changes to plan of care.       Morbid obesity with BMI of 50.0-59.9, adult (HCC)    Continue current meds.  Will adjust as needed based on results.  The patient is asked to make an attempt to improve diet and exercise patterns to aid in medical management of this problem. Addressed importance of increasing and maintaining water intake.        Essential hypertension    Blood pressure well controlled with current medications.  Continue current therapy.  Will reassess at follow up.       Relevant Orders   CMP14+EGFR   CBC with Differential/Platelet   Hypothyroidism (acquired)   Relevant Orders   TSH   CBC with Differential/Platelet   Type 2 diabetes mellitus with diabetic polyneuropathy (HCC)    Checking labs today. Will call pt. With results  Continue current diabetes POC, decrease Tresiba to 60 units daily as patient has been well controlled on current regimen.  Will adjust meds if needed based on labs.        Relevant Orders   CMP14+EGFR   Hemoglobin A1c   CBC with Differential/Platelet   Type 2 diabetes mellitus with diabetic peripheral angiopathy without gangrene (HCC)    Checking labs today. Will call pt. With results  Continue current diabetes POC, Decrease Tresiba to 60 units nightly, as patient has been well controlled on current regimen.  Will adjust meds if needed based on labs.        Paroxysmal atrial fibrillation [facility])    Patient is seen by Cardiology, who manage this condition.  She is well controlled  with current therapy.   Will defer to them for further changes to plan of care.       HOCM (hypertrophic obstructive cardiomyopathy) (HCC)     Resolved Problems   RESOLVED: HLD (hyperlipidemia) - Primary   Relevant Orders   Lipid panel   CMP14+EGFR   CBC with Differential/Platelet   RESOLVED: Bipolar disorder, unspecified (HCC)    Patient is seen by Psychiatry, who manage this condition.  She is well controlled with current therapy.   Will defer to them for further changes to plan of care.       Other Visit Diagnoses     B12 deficiency due to diet       Relevant Orders   Vitamin B12   CBC with Differential/Platelet   Vitamin D deficiency, unspecified       Relevant Orders   VITAMIN D 25 Hydroxy (Vit-D Deficiency, Fractures)   CBC with Differential/Platelet       Return in about 3 months (around 02/07/2023).   Total time spent: 30 minutes  Miki Kins, FNP  11/07/2022   This document may have been prepared by [facility] Voice Recognition software and as such may include unintentional dictation errors.

## 2022-11-07 NOTE — Assessment & Plan Note (Signed)
Checking labs today. Will call pt. With results  Continue current diabetes POC, Decrease Tresiba to 60 units nightly, as patient has been well controlled on current regimen.  Will adjust meds if needed based on labs.

## 2022-11-07 NOTE — Assessment & Plan Note (Signed)
 Patient is seen by Psychiatry, who manage this condition.  She is well controlled with current therapy.   Will defer to them for further changes to plan of care.

## 2022-11-07 NOTE — Assessment & Plan Note (Signed)
Blood pressure well controlled with current medications.  Continue current therapy.  Will reassess at follow up.  

## 2022-11-07 NOTE — Assessment & Plan Note (Signed)
Checking labs today.  Continue current therapy for lipid control. Will modify as needed based on labwork results.  

## 2022-11-07 NOTE — Assessment & Plan Note (Signed)
Patient is seen by Cardiology, who manage this condition.  She is well controlled with current therapy.   Will defer to them for further changes to plan of care.

## 2022-11-07 NOTE — Assessment & Plan Note (Signed)
Continue current meds.  Will adjust as needed based on results.  The patient is asked to make an attempt to improve diet and exercise patterns to aid in medical management of this problem. Addressed importance of increasing and maintaining water intake.   

## 2022-11-07 NOTE — Assessment & Plan Note (Addendum)
Checking labs today. Will call pt. With results  Continue current diabetes POC, decrease Tresiba to 60 units daily as patient has been well controlled on current regimen.  Will adjust meds if needed based on labs.

## 2022-11-09 LAB — CMP14+EGFR
ALT: 13 IU/L (ref 0–32)
AST: 20 IU/L (ref 0–40)
Albumin: 3.5 g/dL — ABNORMAL LOW (ref 3.9–4.9)
Alkaline Phosphatase: 70 IU/L (ref 44–121)
BUN/Creatinine Ratio: 11 — ABNORMAL LOW (ref 12–28)
BUN: 13 mg/dL (ref 8–27)
Bilirubin Total: 0.5 mg/dL (ref 0.0–1.2)
CO2: 23 mmol/L (ref 20–29)
Calcium: 9.4 mg/dL (ref 8.7–10.3)
Chloride: 103 mmol/L (ref 96–106)
Creatinine, Ser: 1.14 mg/dL — ABNORMAL HIGH (ref 0.57–1.00)
Globulin, Total: 3.1 g/dL (ref 1.5–4.5)
Glucose: 77 mg/dL (ref 70–99)
Potassium: 4.5 mmol/L (ref 3.5–5.2)
Sodium: 142 mmol/L (ref 134–144)
Total Protein: 6.6 g/dL (ref 6.0–8.5)
eGFR: 53 mL/min/{1.73_m2} — ABNORMAL LOW (ref >59)

## 2022-11-09 LAB — LIPID PANEL
Chol/HDL Ratio: 3.9 ratio (ref 0.0–4.4)
Cholesterol, Total: 122 mg/dL (ref 100–199)
HDL: 31 mg/dL — ABNORMAL LOW (ref >39)
LDL Chol Calc (NIH): 69 mg/dL (ref 0–99)
Triglycerides: 122 mg/dL (ref 0–149)
VLDL Cholesterol Cal: 22 mg/dL (ref 5–40)

## 2022-11-09 LAB — HEMOGLOBIN A1C
Est. average glucose Bld gHb Est-mCnc: 249 mg/dL
Hgb A1c MFr Bld: 10.3 % — ABNORMAL HIGH (ref 4.8–5.6)

## 2022-11-09 LAB — VITAMIN B12: Vitamin B-12: 643 pg/mL (ref 232–1245)

## 2022-11-09 LAB — TSH: TSH: 3.25 u[IU]/mL (ref 0.450–4.500)

## 2022-11-09 LAB — VITAMIN D 25 HYDROXY (VIT D DEFICIENCY, FRACTURES): Vit D, 25-Hydroxy: 44.7 ng/mL (ref 30.0–100.0)

## 2022-11-21 DIAGNOSIS — M5441 Lumbago with sciatica, right side: Secondary | ICD-10-CM | POA: Diagnosis not present

## 2022-11-21 DIAGNOSIS — F319 Bipolar disorder, unspecified: Secondary | ICD-10-CM | POA: Diagnosis not present

## 2022-11-21 DIAGNOSIS — F1120 Opioid dependence, uncomplicated: Secondary | ICD-10-CM | POA: Diagnosis not present

## 2022-11-21 DIAGNOSIS — T503X5A Adverse effect of electrolytic, caloric and water-balance agents, initial encounter: Secondary | ICD-10-CM | POA: Diagnosis not present

## 2022-11-21 DIAGNOSIS — F419 Anxiety disorder, unspecified: Secondary | ICD-10-CM | POA: Diagnosis not present

## 2022-11-21 DIAGNOSIS — B0223 Postherpetic polyneuropathy: Secondary | ICD-10-CM | POA: Diagnosis not present

## 2022-11-21 DIAGNOSIS — E669 Obesity, unspecified: Secondary | ICD-10-CM | POA: Diagnosis not present

## 2022-11-21 DIAGNOSIS — B0229 Other postherpetic nervous system involvement: Secondary | ICD-10-CM | POA: Diagnosis not present

## 2022-11-21 DIAGNOSIS — Z79891 Long term (current) use of opiate analgesic: Secondary | ICD-10-CM | POA: Diagnosis not present

## 2022-11-21 DIAGNOSIS — K5903 Drug induced constipation: Secondary | ICD-10-CM | POA: Diagnosis not present

## 2022-11-21 DIAGNOSIS — G894 Chronic pain syndrome: Secondary | ICD-10-CM | POA: Diagnosis not present

## 2022-12-23 ENCOUNTER — Other Ambulatory Visit: Payer: Self-pay | Admitting: Family

## 2022-12-23 ENCOUNTER — Other Ambulatory Visit: Payer: Self-pay | Admitting: Internal Medicine

## 2022-12-24 DIAGNOSIS — G894 Chronic pain syndrome: Secondary | ICD-10-CM | POA: Diagnosis not present

## 2022-12-24 DIAGNOSIS — M5441 Lumbago with sciatica, right side: Secondary | ICD-10-CM | POA: Diagnosis not present

## 2022-12-24 DIAGNOSIS — E669 Obesity, unspecified: Secondary | ICD-10-CM | POA: Diagnosis not present

## 2022-12-24 DIAGNOSIS — Z79891 Long term (current) use of opiate analgesic: Secondary | ICD-10-CM | POA: Diagnosis not present

## 2022-12-24 DIAGNOSIS — K5903 Drug induced constipation: Secondary | ICD-10-CM | POA: Diagnosis not present

## 2022-12-24 DIAGNOSIS — F319 Bipolar disorder, unspecified: Secondary | ICD-10-CM | POA: Diagnosis not present

## 2022-12-24 DIAGNOSIS — T503X5A Adverse effect of electrolytic, caloric and water-balance agents, initial encounter: Secondary | ICD-10-CM | POA: Diagnosis not present

## 2022-12-24 DIAGNOSIS — F419 Anxiety disorder, unspecified: Secondary | ICD-10-CM | POA: Diagnosis not present

## 2022-12-24 DIAGNOSIS — B0223 Postherpetic polyneuropathy: Secondary | ICD-10-CM | POA: Diagnosis not present

## 2022-12-24 DIAGNOSIS — F1120 Opioid dependence, uncomplicated: Secondary | ICD-10-CM | POA: Diagnosis not present

## 2022-12-24 DIAGNOSIS — B0229 Other postherpetic nervous system involvement: Secondary | ICD-10-CM | POA: Diagnosis not present

## 2022-12-31 DIAGNOSIS — G4733 Obstructive sleep apnea (adult) (pediatric): Secondary | ICD-10-CM | POA: Diagnosis not present

## 2023-01-21 DIAGNOSIS — B0223 Postherpetic polyneuropathy: Secondary | ICD-10-CM | POA: Diagnosis not present

## 2023-01-21 DIAGNOSIS — F1120 Opioid dependence, uncomplicated: Secondary | ICD-10-CM | POA: Diagnosis not present

## 2023-01-21 DIAGNOSIS — E669 Obesity, unspecified: Secondary | ICD-10-CM | POA: Diagnosis not present

## 2023-01-21 DIAGNOSIS — M545 Low back pain, unspecified: Secondary | ICD-10-CM | POA: Diagnosis not present

## 2023-01-21 DIAGNOSIS — T503X5A Adverse effect of electrolytic, caloric and water-balance agents, initial encounter: Secondary | ICD-10-CM | POA: Diagnosis not present

## 2023-01-21 DIAGNOSIS — Z79891 Long term (current) use of opiate analgesic: Secondary | ICD-10-CM | POA: Diagnosis not present

## 2023-01-21 DIAGNOSIS — M5441 Lumbago with sciatica, right side: Secondary | ICD-10-CM | POA: Diagnosis not present

## 2023-01-21 DIAGNOSIS — G894 Chronic pain syndrome: Secondary | ICD-10-CM | POA: Diagnosis not present

## 2023-01-21 DIAGNOSIS — B0229 Other postherpetic nervous system involvement: Secondary | ICD-10-CM | POA: Diagnosis not present

## 2023-01-21 DIAGNOSIS — F319 Bipolar disorder, unspecified: Secondary | ICD-10-CM | POA: Diagnosis not present

## 2023-01-21 DIAGNOSIS — F419 Anxiety disorder, unspecified: Secondary | ICD-10-CM | POA: Diagnosis not present

## 2023-01-21 DIAGNOSIS — K5903 Drug induced constipation: Secondary | ICD-10-CM | POA: Diagnosis not present

## 2023-02-07 ENCOUNTER — Ambulatory Visit: Payer: PPO | Admitting: Family

## 2023-02-08 ENCOUNTER — Other Ambulatory Visit: Payer: Self-pay | Admitting: Family

## 2023-02-19 ENCOUNTER — Ambulatory Visit (INDEPENDENT_AMBULATORY_CARE_PROVIDER_SITE_OTHER): Payer: PPO | Admitting: Cardiology

## 2023-02-19 ENCOUNTER — Ambulatory Visit: Payer: PPO | Admitting: Cardiology

## 2023-02-19 DIAGNOSIS — Z23 Encounter for immunization: Secondary | ICD-10-CM

## 2023-02-20 ENCOUNTER — Ambulatory Visit: Payer: PPO | Admitting: Family

## 2023-02-25 DIAGNOSIS — T503X5A Adverse effect of electrolytic, caloric and water-balance agents, initial encounter: Secondary | ICD-10-CM | POA: Diagnosis not present

## 2023-02-25 DIAGNOSIS — F419 Anxiety disorder, unspecified: Secondary | ICD-10-CM | POA: Diagnosis not present

## 2023-02-25 DIAGNOSIS — B0223 Postherpetic polyneuropathy: Secondary | ICD-10-CM | POA: Diagnosis not present

## 2023-02-25 DIAGNOSIS — Z79891 Long term (current) use of opiate analgesic: Secondary | ICD-10-CM | POA: Diagnosis not present

## 2023-02-25 DIAGNOSIS — M5441 Lumbago with sciatica, right side: Secondary | ICD-10-CM | POA: Diagnosis not present

## 2023-02-25 DIAGNOSIS — M545 Low back pain, unspecified: Secondary | ICD-10-CM | POA: Diagnosis not present

## 2023-02-25 DIAGNOSIS — F319 Bipolar disorder, unspecified: Secondary | ICD-10-CM | POA: Diagnosis not present

## 2023-02-25 DIAGNOSIS — K5903 Drug induced constipation: Secondary | ICD-10-CM | POA: Diagnosis not present

## 2023-02-25 DIAGNOSIS — B0229 Other postherpetic nervous system involvement: Secondary | ICD-10-CM | POA: Diagnosis not present

## 2023-02-25 DIAGNOSIS — G894 Chronic pain syndrome: Secondary | ICD-10-CM | POA: Diagnosis not present

## 2023-02-25 DIAGNOSIS — E669 Obesity, unspecified: Secondary | ICD-10-CM | POA: Diagnosis not present

## 2023-02-25 DIAGNOSIS — F1120 Opioid dependence, uncomplicated: Secondary | ICD-10-CM | POA: Diagnosis not present

## 2023-03-25 ENCOUNTER — Other Ambulatory Visit: Payer: Self-pay | Admitting: Family

## 2023-03-25 ENCOUNTER — Other Ambulatory Visit: Payer: Self-pay | Admitting: Cardiovascular Disease

## 2023-04-02 DIAGNOSIS — G4733 Obstructive sleep apnea (adult) (pediatric): Secondary | ICD-10-CM | POA: Diagnosis not present

## 2023-04-04 ENCOUNTER — Other Ambulatory Visit: Payer: Self-pay | Admitting: Internal Medicine

## 2023-04-12 ENCOUNTER — Other Ambulatory Visit: Payer: Self-pay | Admitting: Family

## 2023-04-18 ENCOUNTER — Other Ambulatory Visit: Payer: Self-pay | Admitting: Family

## 2023-04-29 DIAGNOSIS — M545 Low back pain, unspecified: Secondary | ICD-10-CM | POA: Diagnosis not present

## 2023-04-29 DIAGNOSIS — G894 Chronic pain syndrome: Secondary | ICD-10-CM | POA: Diagnosis not present

## 2023-04-29 DIAGNOSIS — E669 Obesity, unspecified: Secondary | ICD-10-CM | POA: Diagnosis not present

## 2023-04-29 DIAGNOSIS — F419 Anxiety disorder, unspecified: Secondary | ICD-10-CM | POA: Diagnosis not present

## 2023-04-29 DIAGNOSIS — B0229 Other postherpetic nervous system involvement: Secondary | ICD-10-CM | POA: Diagnosis not present

## 2023-04-29 DIAGNOSIS — K5903 Drug induced constipation: Secondary | ICD-10-CM | POA: Diagnosis not present

## 2023-04-29 DIAGNOSIS — Z79891 Long term (current) use of opiate analgesic: Secondary | ICD-10-CM | POA: Diagnosis not present

## 2023-04-29 DIAGNOSIS — F1120 Opioid dependence, uncomplicated: Secondary | ICD-10-CM | POA: Diagnosis not present

## 2023-04-29 DIAGNOSIS — T503X5A Adverse effect of electrolytic, caloric and water-balance agents, initial encounter: Secondary | ICD-10-CM | POA: Diagnosis not present

## 2023-04-29 DIAGNOSIS — F319 Bipolar disorder, unspecified: Secondary | ICD-10-CM | POA: Diagnosis not present

## 2023-04-29 DIAGNOSIS — M5441 Lumbago with sciatica, right side: Secondary | ICD-10-CM | POA: Diagnosis not present

## 2023-04-29 DIAGNOSIS — B0223 Postherpetic polyneuropathy: Secondary | ICD-10-CM | POA: Diagnosis not present

## 2023-05-01 DIAGNOSIS — Z79891 Long term (current) use of opiate analgesic: Secondary | ICD-10-CM | POA: Diagnosis not present

## 2023-05-01 DIAGNOSIS — F1120 Opioid dependence, uncomplicated: Secondary | ICD-10-CM | POA: Diagnosis not present

## 2023-05-23 ENCOUNTER — Ambulatory Visit: Payer: PPO | Admitting: Family

## 2023-05-31 ENCOUNTER — Encounter: Payer: Self-pay | Admitting: Family

## 2023-05-31 ENCOUNTER — Ambulatory Visit: Payer: PPO | Admitting: Family

## 2023-05-31 VITALS — BP 96/70 | HR 63 | Ht 62.0 in | Wt 290.0 lb

## 2023-05-31 DIAGNOSIS — I10 Essential (primary) hypertension: Secondary | ICD-10-CM | POA: Diagnosis not present

## 2023-05-31 DIAGNOSIS — E782 Mixed hyperlipidemia: Secondary | ICD-10-CM | POA: Diagnosis not present

## 2023-05-31 DIAGNOSIS — Z6841 Body Mass Index (BMI) 40.0 and over, adult: Secondary | ICD-10-CM

## 2023-05-31 DIAGNOSIS — I421 Obstructive hypertrophic cardiomyopathy: Secondary | ICD-10-CM

## 2023-05-31 DIAGNOSIS — F338 Other recurrent depressive disorders: Secondary | ICD-10-CM | POA: Diagnosis not present

## 2023-05-31 DIAGNOSIS — I480 Paroxysmal atrial fibrillation: Secondary | ICD-10-CM

## 2023-05-31 DIAGNOSIS — Z0000 Encounter for general adult medical examination without abnormal findings: Secondary | ICD-10-CM

## 2023-05-31 DIAGNOSIS — E538 Deficiency of other specified B group vitamins: Secondary | ICD-10-CM | POA: Diagnosis not present

## 2023-05-31 DIAGNOSIS — Z794 Long term (current) use of insulin: Secondary | ICD-10-CM | POA: Diagnosis not present

## 2023-05-31 DIAGNOSIS — E559 Vitamin D deficiency, unspecified: Secondary | ICD-10-CM

## 2023-05-31 DIAGNOSIS — E1151 Type 2 diabetes mellitus with diabetic peripheral angiopathy without gangrene: Secondary | ICD-10-CM

## 2023-05-31 DIAGNOSIS — Z0001 Encounter for general adult medical examination with abnormal findings: Secondary | ICD-10-CM

## 2023-05-31 DIAGNOSIS — E039 Hypothyroidism, unspecified: Secondary | ICD-10-CM

## 2023-05-31 DIAGNOSIS — E1142 Type 2 diabetes mellitus with diabetic polyneuropathy: Secondary | ICD-10-CM | POA: Diagnosis not present

## 2023-05-31 LAB — POCT CBG (FASTING - GLUCOSE)-MANUAL ENTRY: Glucose Fasting, POC: 190 mg/dL — AB (ref 70–99)

## 2023-06-01 LAB — CBC WITH DIFFERENTIAL/PLATELET
Basophils Absolute: 0.1 10*3/uL (ref 0.0–0.2)
Basos: 1 %
EOS (ABSOLUTE): 0.2 10*3/uL (ref 0.0–0.4)
Eos: 2 %
Hematocrit: 49.9 % — ABNORMAL HIGH (ref 34.0–46.6)
Hemoglobin: 15.9 g/dL (ref 11.1–15.9)
Immature Grans (Abs): 0 10*3/uL (ref 0.0–0.1)
Immature Granulocytes: 0 %
Lymphocytes Absolute: 1.7 10*3/uL (ref 0.7–3.1)
Lymphs: 20 %
MCH: 27.4 pg (ref 26.6–33.0)
MCHC: 31.9 g/dL (ref 31.5–35.7)
MCV: 86 fL (ref 79–97)
Monocytes Absolute: 0.6 10*3/uL (ref 0.1–0.9)
Monocytes: 7 %
Neutrophils Absolute: 6.1 10*3/uL (ref 1.4–7.0)
Neutrophils: 70 %
Platelets: 231 10*3/uL (ref 150–450)
RBC: 5.81 x10E6/uL — ABNORMAL HIGH (ref 3.77–5.28)
RDW: 14.3 % (ref 11.7–15.4)
WBC: 8.6 10*3/uL (ref 3.4–10.8)

## 2023-06-01 LAB — HEMOGLOBIN A1C
Est. average glucose Bld gHb Est-mCnc: 192 mg/dL
Hgb A1c MFr Bld: 8.3 % — ABNORMAL HIGH (ref 4.8–5.6)

## 2023-06-01 LAB — LIPID PANEL
Chol/HDL Ratio: 3.1 ratio (ref 0.0–4.4)
Cholesterol, Total: 114 mg/dL (ref 100–199)
HDL: 37 mg/dL — ABNORMAL LOW (ref >39)
LDL Chol Calc (NIH): 59 mg/dL (ref 0–99)
Triglycerides: 96 mg/dL (ref 0–149)
VLDL Cholesterol Cal: 18 mg/dL (ref 5–40)

## 2023-06-01 LAB — CMP14+EGFR
ALT: 12 IU/L (ref 0–32)
AST: 16 IU/L (ref 0–40)
Albumin: 3.7 g/dL — ABNORMAL LOW (ref 3.9–4.9)
Alkaline Phosphatase: 60 IU/L (ref 44–121)
BUN/Creatinine Ratio: 15 (ref 12–28)
BUN: 18 mg/dL (ref 8–27)
Bilirubin Total: 0.5 mg/dL (ref 0.0–1.2)
CO2: 25 mmol/L (ref 20–29)
Calcium: 9.1 mg/dL (ref 8.7–10.3)
Chloride: 106 mmol/L (ref 96–106)
Creatinine, Ser: 1.19 mg/dL — ABNORMAL HIGH (ref 0.57–1.00)
Globulin, Total: 2.5 g/dL (ref 1.5–4.5)
Glucose: 163 mg/dL — ABNORMAL HIGH (ref 70–99)
Potassium: 4.8 mmol/L (ref 3.5–5.2)
Sodium: 144 mmol/L (ref 134–144)
Total Protein: 6.2 g/dL (ref 6.0–8.5)
eGFR: 50 mL/min/{1.73_m2} — ABNORMAL LOW (ref >59)

## 2023-06-01 LAB — VITAMIN B12: Vitamin B-12: 500 pg/mL (ref 232–1245)

## 2023-06-01 LAB — VITAMIN D 25 HYDROXY (VIT D DEFICIENCY, FRACTURES): Vit D, 25-Hydroxy: 47.6 ng/mL (ref 30.0–100.0)

## 2023-06-01 LAB — TSH: TSH: 1.97 u[IU]/mL (ref 0.450–4.500)

## 2023-06-14 ENCOUNTER — Other Ambulatory Visit: Payer: Self-pay

## 2023-06-14 DIAGNOSIS — N1831 Chronic kidney disease, stage 3a: Secondary | ICD-10-CM

## 2023-06-21 ENCOUNTER — Other Ambulatory Visit: Payer: Self-pay | Admitting: Family

## 2023-06-22 ENCOUNTER — Other Ambulatory Visit: Payer: Self-pay | Admitting: Family

## 2023-06-23 ENCOUNTER — Encounter: Payer: Self-pay | Admitting: Family

## 2023-06-23 NOTE — Assessment & Plan Note (Signed)
 Blood pressure well controlled with current medications.  Continue current therapy.  Will reassess at follow up.

## 2023-06-23 NOTE — Assessment & Plan Note (Signed)
 Checking labs today.  Continue current therapy for lipid control. Will modify as needed based on labwork results.

## 2023-06-23 NOTE — Assessment & Plan Note (Signed)
 Patient is seen by Cardiology, who manage this condition.  She is well controlled with current therapy.   Will defer to them for further changes to plan of care.

## 2023-06-23 NOTE — Assessment & Plan Note (Signed)
 Checking labs today.  Will continue supplements as needed.

## 2023-06-23 NOTE — Assessment & Plan Note (Signed)
 Continue current meds.  Will adjust as needed based on results.  The patient is asked to make an attempt to improve diet and exercise patterns to aid in medical management of this problem. Addressed importance of increasing and maintaining water intake.

## 2023-06-23 NOTE — Assessment & Plan Note (Signed)
 Patient stable.  Well controlled with current therapy.   Continue current meds.

## 2023-06-23 NOTE — Assessment & Plan Note (Signed)
 Checking labs today. Will call pt. With results  Continue current diabetes POC. Will adjust meds if needed based on labs.

## 2023-06-23 NOTE — Progress Notes (Signed)
 Annual Wellness Visit  Patient: Barbara Spencer, Female    DOB: 21-May-1955, 68 y.o.   MRN: [id number] Visit Date: 06/23/2023  Today's Provider: Miki Kins, FNP  Subjective:    Chief Complaint  Patient presents with   Annual Exam    AWV   Barbara Spencer is a 68 y.o. female who presents today for her Annual Wellness Visit.    Past Medical History:  Diagnosis Date   Anxiety    managing well with medication   Aortic stenosis    mild   Bipolar disorder (HCC)    DDD (degenerative disc disease), lumbar    back and hip   Diabetes mellitus, type II (HCC)    type 2   Gastric ulcer 12/02/2015   H/O: CVA (cerebrovascular accident) 08/03/2016   Heart murmur    HLD (hyperlipidemia) 08/03/2016   HTN (hypertension)    Hypothyroidism    Long term (current) use of insulin (HCC) 11/04/2018   Obesity    Seasonal affective disorder (HCC)    under control   Sleep apnea    wears cpap   Stroke (HCC)    x 2   Tachycardia    Thyroid disease    Past Surgical History:  Procedure Laterality Date   APPENDECTOMY     CARDIAC CATHETERIZATION     x3 -all clear   CESAREAN SECTION     CHOLECYSTECTOMY     COLONOSCOPY WITH PROPOFOL N/A 02/03/2015   Procedure: COLONOSCOPY WITH PROPOFOL;  Surgeon: Carman Ching, MD;  Location: [facility];  Service: Endoscopy;  Laterality: N/A;   COLONOSCOPY WITH PROPOFOL N/A 03/01/2016   Procedure: COLONOSCOPY WITH PROPOFOL;  Surgeon: Carman Ching, MD;  Location: [facility];  Service: Endoscopy;  Laterality: N/A;   ESOPHAGOGASTRODUODENOSCOPY (EGD) WITH PROPOFOL N/A 12/02/2015   Procedure: ESOPHAGOGASTRODUODENOSCOPY (EGD) WITH PROPOFOL;  Surgeon: Carman Ching, MD;  Location: [facility]COPY;  Service: Endoscopy;  Laterality: N/A;   ESOPHAGOGASTRODUODENOSCOPY (EGD) WITH PROPOFOL N/A 03/01/2016   Procedure: ESOPHAGOGASTRODUODENOSCOPY (EGD) WITH PROPOFOL;  Surgeon: Carman Ching, MD;  Location: [facility];  Service: Endoscopy;  Laterality: N/A;   EUS N/A  04/11/2016   Procedure: UPPER ENDOSCOPIC ULTRASOUND (EUS) RADIAL;  Surgeon: Willis Modena, MD;  Location: [facility];  Service: Endoscopy;  Laterality: N/A;   HERNIA REPAIR     abdominal hernia repair after gallbladder surgery   LAPAROSCOPIC PARTIAL GASTRECTOMY N/A 12/18/2016   Procedure: LAPAROSCOPIC PARTIAL GASTRECTOMY WITH ROUX-EN-Y RECONSTRUCTION;  Surgeon: Glenna Fellows, MD;  Location: [facility];  Service: General;  Laterality: N/A;   PARTIAL GASTRECTOMY     12/18/16 Dr. Johna Sheriff   Family History  Problem Relation Age of Onset   Heart disease Mother    Diabetes Mother    Hypertension Mother    Heart disease Father    Dementia Father    Alcohol abuse Father    Breast cancer Cousin    Social History   Socioeconomic History   Marital status: Married    Spouse name: Fayrene Fearing (jim)   Number of children: 5   Years of education: Not on file   Highest education level: High school graduate  Occupational History   Not on file  Tobacco Use   Smoking status: Former    Current packs/day: 0.00    Types: Cigarettes    Quit date: 09/02/2004    Years since quitting: 18.8   Smokeless tobacco: Never  Vaping Use   Vaping status: Never Used  Substance and Sexual Activity   Alcohol  use: Not Currently    Alcohol/week: 0.0 standard drinks of alcohol    Comment: occasional   Drug use: No   Sexual activity: Yes    Birth control/protection: None  Other Topics Concern   Not on file  Social History Narrative   Not on file   Social Drivers of Health   Financial Resource Strain: Low Risk  (05/31/2023)   Overall Financial Resource Strain (CARDIA)    Difficulty of Paying Living Expenses: Not hard at all  Food Insecurity: No Food Insecurity (06/23/2023)   Hunger Vital Sign    Worried About Running Out of Food in the Last Year: Never true    Ran Out of Food in the Last Year: Never true  Transportation Needs: No Transportation Needs (05/31/2023)   PRAPARE - Scientist, research (physical sciences) (Medical): No    Lack of Transportation (Non-Medical): No  Physical Activity: Inactive (06/23/2023)   Exercise Vital Sign    Days of Exercise per Week: 0 days    Minutes of Exercise per Session: 0 min  Stress: No Stress Concern Present (05/31/2023)   [employer] of Occupational Health - Occupational Stress Questionnaire    Feeling of Stress : Not at all  Social Connections: Unknown (07/31/2018)   Social Connection and Isolation Panel [NHANES]    Frequency of Communication with Friends and Family: Not on file    Frequency of Social Gatherings with Friends and Family: Not on file    Attends Religious Services: 1 to 4 times per year    Active Member of [employer] or Organizations: No    Attends Banker Meetings: Never    Marital Status: Married  Catering manager Violence: Not At Risk (06/23/2023)   Humiliation, Afraid, Rape, and Kick questionnaire    Fear of Current or Ex-Partner: No    Emotionally Abused: No    Physically Abused: No    Sexually Abused: No    Medications: Outpatient Medications Prior to Visit  Medication Sig   buPROPion (WELLBUTRIN XL) 300 MG 24 hr tablet TAKE 1 TABLET BY MOUTH EVERY MORNING   busPIRone (BUSPAR) 15 MG tablet TAKE 1 TABLET BY MOUTH TWICE DAILY   clotrimazole-betamethasone (LOTRISONE) cream APPLY A SMALL AMOUNT TO THE AFFECTED AREA ONCE DAILY FOR 7 DAYS, TAKE BREAK FOR 7 DAYS   ELIQUIS 5 MG TABS tablet TAKE 1 TABLET(5 MG) BY MOUTH TWICE DAILY   FARXIGA 10 MG TABS tablet TAKE 1 TABLET BY MOUTH DAILY   furosemide (LASIX) 20 MG tablet TK 1 T PO BID   gabapentin (NEURONTIN) 300 MG capsule TAKE 1 CAPSULE BY MOUTH EVERY NIGHT AT BEDTIME   GEMTESA 75 MG TABS Take 1 tablet by mouth daily.   Insulin Pen Needle (BD PEN NEEDLE NANO 2ND GEN) 32G X 4 MM MISC USE TWICE DAILY   losartan (COZAAR) 50 MG tablet TAKE 1 TABLET BY MOUTH EVERY DAY   metoprolol tartrate (LOPRESSOR) 50 MG tablet TAKE 1 TABLET BY MOUTH TWICE DAILY   omeprazole  (PRILOSEC OTC) 20 MG tablet Take 20 mg by mouth 2 (two) times daily.    traZODone (DESYREL) 50 MG tablet TAKE 1/2 TO 1 TABLET BY MOUTH EVERY NIGHT AT BEDTIME AS NEEDED FOR INSOMNIA   TRESIBA FLEXTOUCH 200 UNIT/ML FlexTouch Pen ADMINISTER 65 UNITS UNDER THE SKIN EVERY NIGHT AT BEDTIME   VRAYLAR 1.5 MG capsule TAKE 1 CAPSULE BY MOUTH EVERY DAY   XTAMPZA ER 9 MG C12A Take 1 capsule by mouth every 12 (  twelve) hours.   [DISCONTINUED] rosuvastatin (CRESTOR) 40 MG tablet TAKE 1 TABLET BY MOUTH EVERY NIGHT AT BEDTIME   amiodarone (PACERONE) 200 MG tablet TK 1 T PO QD (Patient not taking: Reported on 05/31/2023)   Cholecalciferol (VITAMIN D3) 1000 units CAPS Take 2,000 Units by mouth daily.  (Patient not taking: Reported on 05/31/2023)   ELIQUIS 5 MG TABS tablet TK 1 T PO BID (Patient not taking: Reported on 05/31/2023)   JANUVIA 100 MG tablet Take 100 mg by mouth daily. (Patient not taking: Reported on 05/31/2023)   levothyroxine (SYNTHROID, LEVOTHROID) 50 MCG tablet Take 50 mcg by mouth daily before breakfast.  (Patient not taking: Reported on 05/31/2023)   losartan-hydrochlorothiazide (HYZAAR) 50-12.5 MG tablet TAKE ONE TABLET BY MOUTH ONCE A DAY. (Patient not taking: Reported on 05/31/2023)   Omega-3 Fatty Acids (FISH OIL PO) Take 4 capsules by mouth daily.  (Patient not taking: Reported on 05/31/2023)   Venlafaxine HCl 225 MG TB24 Take 1 tablet (225 mg total) by mouth daily. (Patient not taking: Reported on 05/31/2023)   No facility-administered medications prior to visit.    Allergies  Allergen Reactions   Latex Hives   Sulfa Antibiotics Itching and Swelling   Tape Rash and Other (See Comments)    Paper tape ok to use per pt-Tegaderm reddens skin and Electrodes "ripped skin off and now have scars"    Patient Care Team: Miki Kins, FNP as PCP - General (Family Medicine)  Review of Systems  All other systems reviewed and are negative.      Objective:    Vitals: BP 96/70   Pulse 63   Ht 5\' 2"   (1.575 m)   Wt 290 lb (131.5 kg)   SpO2 91%   BMI 53.04 kg/m    Physical Exam Vitals and nursing note reviewed.  Constitutional:      Appearance: Normal appearance. She is normal weight.  HENT:     Head: Normocephalic.  Eyes:     Extraocular Movements: Extraocular movements intact.     Conjunctiva/sclera: Conjunctivae normal.     Pupils: Pupils are equal, round, and reactive to light.  Cardiovascular:     Rate and Rhythm: Normal rate.  Pulmonary:     Effort: Pulmonary effort is normal.  Neurological:     General: No focal deficit present.     Mental Status: She is alert and oriented to person, place, and time. Mental status is at baseline.  Psychiatric:        Mood and Affect: Mood normal.        Behavior: Behavior normal.        Thought Content: Thought content normal.        Judgment: Judgment normal.      Most recent functional status assessment:    06/23/2023    1:04 PM  In your present state of health, do you have any difficulty performing the following activities:  Hearing? 0  Vision? 0  Difficulty concentrating or making decisions? 1  Walking or climbing stairs? 1  Dressing or bathing? 1  Doing errands, shopping? 1  Preparing Food and eating ? Y  Using the Toilet? Y  In the past six months, have you accidently leaked urine? Y  Do you have problems with loss of bowel control? N  Managing your Medications? N  Managing your Finances? N  Housekeeping or managing your Housekeeping? Y    Most recent fall risk assessment:    06/23/2023    1:10 PM  Fall Risk   Risk for fall due to : History of fall(s);Impaired balance/gait;Impaired mobility  Follow up Falls evaluation completed     Most recent depression screenings:    05/31/2023    1:11 PM 01/02/2017   11:24 AM  PHQ 2/9 Scores  PHQ - 2 Score 0 0  PHQ- 9 Score 0     Most recent cognitive screening:    06/23/2023    1:10 PM  6CIT Screen  What Year? 0 points  What month? 0 points  What time? 0  points  Count back from 20 0 points  Months in reverse 0 points  Repeat phrase 0 points  Total Score 0 points    Results for orders placed or performed in visit on 05/31/23  Lipid panel  Result Value Ref Range   Cholesterol, Total 114 100 - 199 mg/dL   Triglycerides 96 0 - 149 mg/dL   HDL 37 (L) >08 mg/dL   VLDL Cholesterol Cal 18 5 - 40 mg/dL   LDL Chol Calc (NIH) 59 0 - 99 mg/dL   Chol/HDL Ratio 3.1 0.0 - 4.4 ratio  VITAMIN D 25 Hydroxy (Vit-D Deficiency, Fractures)  Result Value Ref Range   Vit D, 25-Hydroxy 47.6 30.0 - 100.0 ng/mL  CMP14+EGFR  Result Value Ref Range   Glucose 163 (H) 70 - 99 mg/dL   BUN 18 8 - 27 mg/dL   Creatinine, Ser 6.57 (H) 0.57 - 1.00 mg/dL   eGFR 50 (L) >84 ON/GEX/5.28   BUN/Creatinine Ratio 15 12 - 28   Sodium 144 134 - 144 mmol/L   Potassium 4.8 3.5 - 5.2 mmol/L   Chloride 106 96 - 106 mmol/L   CO2 25 20 - 29 mmol/L   Calcium 9.1 8.7 - 10.3 mg/dL   Total Protein 6.2 6.0 - 8.5 g/dL   Albumin 3.7 (L) 3.9 - 4.9 g/dL   Globulin, Total 2.5 1.5 - 4.5 g/dL   Bilirubin Total 0.5 0.0 - 1.2 mg/dL   Alkaline Phosphatase 60 44 - 121 IU/L   AST 16 0 - 40 IU/L   ALT 12 0 - 32 IU/L  TSH  Result Value Ref Range   TSH 1.970 0.450 - 4.500 uIU/mL  Hemoglobin A1c  Result Value Ref Range   Hgb A1c MFr Bld 8.3 (H) 4.8 - 5.6 %   Est. average glucose Bld gHb Est-mCnc 192 mg/dL  Vitamin U13  Result Value Ref Range   Vitamin B-12 500 232 - 1,245 pg/mL  CBC with Diff  Result Value Ref Range   WBC 8.6 3.4 - 10.8 x10E3/uL   RBC 5.81 (H) 3.77 - 5.28 x10E6/uL   Hemoglobin 15.9 11.1 - 15.9 g/dL   Hematocrit 24.4 (H) 01.0 - 46.6 %   MCV 86 79 - 97 fL   MCH 27.4 26.6 - 33.0 pg   MCHC 31.9 31.5 - 35.7 g/dL   RDW 27.2 53.6 - 64.4 %   Platelets 231 150 - 450 x10E3/uL   Neutrophils 70 Not Estab. %   Lymphs 20 Not Estab. %   Monocytes 7 Not Estab. %   Eos 2 Not Estab. %   Basos 1 Not Estab. %   Neutrophils Absolute 6.1 1.4 - 7.0 x10E3/uL   Lymphocytes Absolute  1.7 0.7 - 3.1 x10E3/uL   Monocytes Absolute 0.6 0.1 - 0.9 x10E3/uL   EOS (ABSOLUTE) 0.2 0.0 - 0.4 x10E3/uL   Basophils Absolute 0.1 0.0 - 0.2 x10E3/uL   Immature Granulocytes 0 Not Estab. %  Immature Grans (Abs) 0.0 0.0 - 0.1 x10E3/uL  POCT CBG (Fasting - Glucose)  Result Value Ref Range   Glucose Fasting, POC 190 (A) 70 - 99 mg/dL       Assessment & Plan:      Annual wellness visit done today including the all of the following: Reviewed patient's Family Medical History Reviewed and updated list of patient's medical providers Assessment of cognitive impairment was done Assessed patient's functional ability Established a written schedule for health screening services Health Risk Assessent Completed and Reviewed  Exercise Activities and Dietary recommendations  Goals      Activity and Exercise Increased     Evidence-based guidance:  Review current exercise levels.  Assess patient perspective on exercise or activity level, barriers to increasing activity, motivation and readiness for change.  Recommend or set healthy exercise goal based on individual tolerance.  Encourage small steps toward making change in amount of exercise or activity.  Urge reduction of sedentary activities or screen time.  Promote group activities within the community or with family or support person.  Consider referral to rehabiliation therapist for assessment and exercise/activity plan.   Notes:      Disease Progression Prevented or Minimized     Evidence-based guidance:  Prepare patient for laboratory and diagnostic exams based on risk and presentation.  Encourage lifestyle changes, such as increased intake of plant-based foods, stress reduction, consistent physical activity and smoking cessation to prevent long-term complications and chronic disease.   Individualize activity and exercise recommendations while considering potential limitations, such as neuropathy, retinopathy or the ability to prevent  hyperglycemia or hypoglycemia.   Prepare patient for use of pharmacologic therapy that may include antihypertensive, analgesic, prostaglandin E1 with periodic adjustments, based on presenting chronic condition and laboratory results.  Assess signs/symptoms and risk factors for hypertension, sleep-disordered breathing, neuropathy (including changes in gait and balance), retinopathy, nephropathy and sexual dysfunction.  Address pregnancy planning and contraceptive choice, especially when prescribing antihypertensive or statin.  Ensure completion of annual comprehensive foot exam and dilated eye exam.   Implement additional individualized goals and interventions based on identified risk factors.  Prepare patient for consultation or referral for specialist care, such as ophthalmology, neurology, cardiology, podiatry, nephrology or perinatology.   Notes:      Hypertension Monitored     Evidence-based guidance:  Promote initial use of ambulatory blood pressure measurements (for 3 days) to rule out "white-coat" effect; identify masked hypertension and presence or absence of nocturnal "dipping" of blood pressure.   Encourage continued use of home blood pressure monitoring and recording in blood pressure log; include symptoms of hypotension or potential medication side effects in log.  Review blood pressure measurements taken inside and outside of the provider office; establish baseline and monitor trends; compare to target ranges or patient goal.  Share overall cardiovascular risk with patient; encourage changes to lifestyle risk factors, including alcohol consumption, smoking, inadequate exercise, poor dietary habits and stress.   Notes:         Immunization History  Administered Date(s) Administered   Fluad Trivalent(High Dose 65+) 02/19/2023   Influenza Inj Mdck Quad Pf 01/11/2017, 12/06/2017, 01/13/2021   Influenza Inj Mdck Quad With Preservative 01/01/2020   Moderna Sars-Covid-2 Vaccination  04/01/2020   Pneumococcal Conjugate-13 01/17/2018   Zoster Recombinant(Shingrix) 10/25/2017, 01/22/2018    Health Maintenance  Topic Date Due   FOOT EXAM  Never done   OPHTHALMOLOGY EXAM  Never done   Diabetic kidney evaluation - Urine ACR  Never done  Hepatitis C Screening  Never done   DTaP/Tdap/Td (1 - Tdap) Never done   Pneumonia Vaccine 54+ Years old (2 of 2 - PPSV23 or PCV20) 03/14/2018   DEXA SCAN  Never done   MAMMOGRAM  09/09/2021   Medicare Annual Wellness (AWV)  11/22/2022   COVID-19 Vaccine (2 - 2024-25 season) 11/25/2022   HEMOGLOBIN A1C  12/01/2023   Diabetic kidney evaluation - eGFR measurement  05/30/2024   Colonoscopy  03/01/2026   INFLUENZA VACCINE  Completed   Zoster Vaccines- Shingrix  Completed   HPV VACCINES  Aged Out     Discussed health benefits of physical activity, and encouraged her to engage in regular exercise appropriate for her age and condition.        Miki Kins, FNP   05/31/2023  This document may have been prepared by [facility] Voice Recognition software and as such may include unintentional dictation errors.

## 2023-07-10 DIAGNOSIS — F419 Anxiety disorder, unspecified: Secondary | ICD-10-CM | POA: Diagnosis not present

## 2023-07-10 DIAGNOSIS — F319 Bipolar disorder, unspecified: Secondary | ICD-10-CM | POA: Diagnosis not present

## 2023-07-10 DIAGNOSIS — B0229 Other postherpetic nervous system involvement: Secondary | ICD-10-CM | POA: Diagnosis not present

## 2023-07-10 DIAGNOSIS — B0223 Postherpetic polyneuropathy: Secondary | ICD-10-CM | POA: Diagnosis not present

## 2023-07-10 DIAGNOSIS — E669 Obesity, unspecified: Secondary | ICD-10-CM | POA: Diagnosis not present

## 2023-07-10 DIAGNOSIS — T503X5A Adverse effect of electrolytic, caloric and water-balance agents, initial encounter: Secondary | ICD-10-CM | POA: Diagnosis not present

## 2023-07-10 DIAGNOSIS — M5441 Lumbago with sciatica, right side: Secondary | ICD-10-CM | POA: Diagnosis not present

## 2023-07-10 DIAGNOSIS — K5903 Drug induced constipation: Secondary | ICD-10-CM | POA: Diagnosis not present

## 2023-07-10 DIAGNOSIS — M545 Low back pain, unspecified: Secondary | ICD-10-CM | POA: Diagnosis not present

## 2023-07-10 DIAGNOSIS — Z79891 Long term (current) use of opiate analgesic: Secondary | ICD-10-CM | POA: Diagnosis not present

## 2023-07-10 DIAGNOSIS — F1120 Opioid dependence, uncomplicated: Secondary | ICD-10-CM | POA: Diagnosis not present

## 2023-07-10 DIAGNOSIS — G894 Chronic pain syndrome: Secondary | ICD-10-CM | POA: Diagnosis not present

## 2023-07-11 DIAGNOSIS — Z79891 Long term (current) use of opiate analgesic: Secondary | ICD-10-CM | POA: Diagnosis not present

## 2023-07-11 DIAGNOSIS — F1120 Opioid dependence, uncomplicated: Secondary | ICD-10-CM | POA: Diagnosis not present

## 2023-08-21 DIAGNOSIS — T503X5A Adverse effect of electrolytic, caloric and water-balance agents, initial encounter: Secondary | ICD-10-CM | POA: Diagnosis not present

## 2023-08-21 DIAGNOSIS — M545 Low back pain, unspecified: Secondary | ICD-10-CM | POA: Diagnosis not present

## 2023-08-21 DIAGNOSIS — B0223 Postherpetic polyneuropathy: Secondary | ICD-10-CM | POA: Diagnosis not present

## 2023-08-21 DIAGNOSIS — F419 Anxiety disorder, unspecified: Secondary | ICD-10-CM | POA: Diagnosis not present

## 2023-08-21 DIAGNOSIS — B0229 Other postherpetic nervous system involvement: Secondary | ICD-10-CM | POA: Diagnosis not present

## 2023-08-21 DIAGNOSIS — M5441 Lumbago with sciatica, right side: Secondary | ICD-10-CM | POA: Diagnosis not present

## 2023-08-21 DIAGNOSIS — G894 Chronic pain syndrome: Secondary | ICD-10-CM | POA: Diagnosis not present

## 2023-08-21 DIAGNOSIS — Z79891 Long term (current) use of opiate analgesic: Secondary | ICD-10-CM | POA: Diagnosis not present

## 2023-08-21 DIAGNOSIS — E669 Obesity, unspecified: Secondary | ICD-10-CM | POA: Diagnosis not present

## 2023-08-21 DIAGNOSIS — K5903 Drug induced constipation: Secondary | ICD-10-CM | POA: Diagnosis not present

## 2023-08-21 DIAGNOSIS — F319 Bipolar disorder, unspecified: Secondary | ICD-10-CM | POA: Diagnosis not present

## 2023-08-21 DIAGNOSIS — F1120 Opioid dependence, uncomplicated: Secondary | ICD-10-CM | POA: Diagnosis not present

## 2023-09-20 ENCOUNTER — Other Ambulatory Visit: Payer: Self-pay | Admitting: Family

## 2023-09-20 ENCOUNTER — Other Ambulatory Visit: Payer: Self-pay | Admitting: Cardiovascular Disease

## 2023-09-22 ENCOUNTER — Other Ambulatory Visit: Payer: Self-pay | Admitting: Internal Medicine

## 2023-10-04 DIAGNOSIS — G4733 Obstructive sleep apnea (adult) (pediatric): Secondary | ICD-10-CM | POA: Diagnosis not present

## 2023-10-23 DIAGNOSIS — G894 Chronic pain syndrome: Secondary | ICD-10-CM | POA: Diagnosis not present

## 2023-10-23 DIAGNOSIS — B0223 Postherpetic polyneuropathy: Secondary | ICD-10-CM | POA: Diagnosis not present

## 2023-10-23 DIAGNOSIS — Z79891 Long term (current) use of opiate analgesic: Secondary | ICD-10-CM | POA: Diagnosis not present

## 2023-10-23 DIAGNOSIS — K5903 Drug induced constipation: Secondary | ICD-10-CM | POA: Diagnosis not present

## 2023-10-23 DIAGNOSIS — F419 Anxiety disorder, unspecified: Secondary | ICD-10-CM | POA: Diagnosis not present

## 2023-10-23 DIAGNOSIS — B0229 Other postherpetic nervous system involvement: Secondary | ICD-10-CM | POA: Diagnosis not present

## 2023-10-23 DIAGNOSIS — F319 Bipolar disorder, unspecified: Secondary | ICD-10-CM | POA: Diagnosis not present

## 2023-10-23 DIAGNOSIS — M545 Low back pain, unspecified: Secondary | ICD-10-CM | POA: Diagnosis not present

## 2023-10-23 DIAGNOSIS — F1120 Opioid dependence, uncomplicated: Secondary | ICD-10-CM | POA: Diagnosis not present

## 2023-10-23 DIAGNOSIS — M5441 Lumbago with sciatica, right side: Secondary | ICD-10-CM | POA: Diagnosis not present

## 2023-10-23 DIAGNOSIS — T503X5A Adverse effect of electrolytic, caloric and water-balance agents, initial encounter: Secondary | ICD-10-CM | POA: Diagnosis not present

## 2023-10-23 DIAGNOSIS — E669 Obesity, unspecified: Secondary | ICD-10-CM | POA: Diagnosis not present

## 2023-10-25 ENCOUNTER — Other Ambulatory Visit: Payer: Self-pay | Admitting: Family

## 2023-11-19 NOTE — Progress Notes (Signed)
   11/19/2023  Patient ID: Barbara Spencer, female   DOB: Jul 25, 1955, 69 y.o.   MRN: [id number]  Pharmacy Quality Measure Review  This patient is appearing on a report for being at risk of failing the adherence measure for hypertension (ACEi/ARB) medications this calendar year.   Medication: Losartan  Last fill date: 09/20/23 for 90 day supply  Insurance report was not up to date. No action needed at this time.   Jon VEAR Lindau, PharmD Clinical Pharmacist [phone]

## 2023-11-20 DIAGNOSIS — G894 Chronic pain syndrome: Secondary | ICD-10-CM | POA: Diagnosis not present

## 2023-11-20 DIAGNOSIS — M545 Low back pain, unspecified: Secondary | ICD-10-CM | POA: Diagnosis not present

## 2023-11-20 DIAGNOSIS — K5903 Drug induced constipation: Secondary | ICD-10-CM | POA: Diagnosis not present

## 2023-11-20 DIAGNOSIS — F419 Anxiety disorder, unspecified: Secondary | ICD-10-CM | POA: Diagnosis not present

## 2023-11-20 DIAGNOSIS — B0229 Other postherpetic nervous system involvement: Secondary | ICD-10-CM | POA: Diagnosis not present

## 2023-11-20 DIAGNOSIS — B0223 Postherpetic polyneuropathy: Secondary | ICD-10-CM | POA: Diagnosis not present

## 2023-11-20 DIAGNOSIS — M5441 Lumbago with sciatica, right side: Secondary | ICD-10-CM | POA: Diagnosis not present

## 2023-11-20 DIAGNOSIS — F319 Bipolar disorder, unspecified: Secondary | ICD-10-CM | POA: Diagnosis not present

## 2023-11-20 DIAGNOSIS — T503X5A Adverse effect of electrolytic, caloric and water-balance agents, initial encounter: Secondary | ICD-10-CM | POA: Diagnosis not present

## 2023-11-20 DIAGNOSIS — F1120 Opioid dependence, uncomplicated: Secondary | ICD-10-CM | POA: Diagnosis not present

## 2023-11-20 DIAGNOSIS — Z79891 Long term (current) use of opiate analgesic: Secondary | ICD-10-CM | POA: Diagnosis not present

## 2023-11-20 DIAGNOSIS — E669 Obesity, unspecified: Secondary | ICD-10-CM | POA: Diagnosis not present

## 2023-11-29 ENCOUNTER — Emergency Department

## 2023-11-29 ENCOUNTER — Other Ambulatory Visit: Payer: Self-pay

## 2023-11-29 ENCOUNTER — Emergency Department
Admission: EM | Admit: 2023-11-29 | Discharge: 2023-11-29 | Disposition: A | Discharge status: Home / Self Care | Attending: Emergency Medicine | Admitting: Emergency Medicine

## 2023-11-29 DIAGNOSIS — R590 Localized enlarged lymph nodes: Secondary | ICD-10-CM | POA: Diagnosis not present

## 2023-11-29 DIAGNOSIS — E119 Type 2 diabetes mellitus without complications: Secondary | ICD-10-CM | POA: Insufficient documentation

## 2023-11-29 DIAGNOSIS — J189 Pneumonia, unspecified organism: Secondary | ICD-10-CM | POA: Diagnosis not present

## 2023-11-29 DIAGNOSIS — R531 Weakness: Secondary | ICD-10-CM | POA: Diagnosis not present

## 2023-11-29 DIAGNOSIS — R918 Other nonspecific abnormal finding of lung field: Secondary | ICD-10-CM | POA: Diagnosis not present

## 2023-11-29 DIAGNOSIS — J9811 Atelectasis: Secondary | ICD-10-CM | POA: Diagnosis not present

## 2023-11-29 DIAGNOSIS — I10 Essential (primary) hypertension: Secondary | ICD-10-CM | POA: Diagnosis not present

## 2023-11-29 DIAGNOSIS — I77810 Thoracic aortic ectasia: Secondary | ICD-10-CM | POA: Diagnosis not present

## 2023-11-29 DIAGNOSIS — R0989 Other specified symptoms and signs involving the circulatory and respiratory systems: Secondary | ICD-10-CM | POA: Diagnosis not present

## 2023-11-29 DIAGNOSIS — J984 Other disorders of lung: Secondary | ICD-10-CM | POA: Diagnosis not present

## 2023-11-29 LAB — RESP PANEL BY RT-PCR (RSV, FLU A&B, COVID)  RVPGX2
Influenza A by PCR: NEGATIVE
Influenza B by PCR: NEGATIVE
Resp Syncytial Virus by PCR: NEGATIVE
SARS Coronavirus 2 by RT PCR: NEGATIVE

## 2023-11-29 LAB — LACTIC ACID, PLASMA: Lactic Acid, Venous: 1.2 mmol/L (ref 0.5–1.9)

## 2023-11-29 LAB — COMPREHENSIVE METABOLIC PANEL WITH GFR
ALT: 12 U/L (ref 0–44)
AST: 13 U/L — ABNORMAL LOW (ref 15–41)
Albumin: 3.1 g/dL — ABNORMAL LOW (ref 3.5–5.0)
Alkaline Phosphatase: 48 U/L (ref 38–126)
Anion gap: 10 (ref 5–15)
BUN: 14 mg/dL (ref 8–23)
CO2: 27 mmol/L (ref 22–32)
Calcium: 8.9 mg/dL (ref 8.9–10.3)
Chloride: 103 mmol/L (ref 98–111)
Creatinine, Ser: 1.08 mg/dL — ABNORMAL HIGH (ref 0.44–1.00)
GFR, Estimated: 56 mL/min — ABNORMAL LOW (ref >60)
Glucose, Bld: 84 mg/dL (ref 70–99)
Potassium: 3.8 mmol/L (ref 3.5–5.1)
Sodium: 140 mmol/L (ref 135–145)
Total Bilirubin: 1.1 mg/dL (ref 0.0–1.2)
Total Protein: 6.8 g/dL (ref 6.5–8.1)

## 2023-11-29 LAB — CBC
HCT: 50.2 % — ABNORMAL HIGH (ref 36.0–46.0)
Hemoglobin: 16.2 g/dL — ABNORMAL HIGH (ref 12.0–15.0)
MCH: 27.5 pg (ref 26.0–34.0)
MCHC: 32.3 g/dL (ref 30.0–36.0)
MCV: 85.2 fL (ref 80.0–100.0)
Platelets: 265 K/uL (ref 150–400)
RBC: 5.89 MIL/uL — ABNORMAL HIGH (ref 3.87–5.11)
RDW: 14.1 % (ref 11.5–15.5)
WBC: 9.2 K/uL (ref 4.0–10.5)
nRBC: 0 % (ref 0.0–0.2)

## 2023-11-29 MED ORDER — AZITHROMYCIN 250 MG PO TABS: ORAL_TABLET | ORAL | 0 refills | Status: AC

## 2023-11-29 MED ORDER — CEPHALEXIN 500 MG PO CAPS: 500.0000 mg | ORAL_CAPSULE | Freq: Three times a day (TID) | ORAL | 0 refills | Status: AC

## 2023-11-29 NOTE — ED Provider Notes (Signed)
 [facility] Provider Note    Event Date/Time   First MD Initiated Contact with Patient 11/29/23 1323     (approximate)   History   Weakness   HPI  Barbara Spencer is a 68 y.o. female with history of diabetes, paroxysmal atrial fibrillation who presents with complaints of weakness, patient was getting at the board this morning and felt like she had little energy and slid out of bed.  She reports she is feeling better now.  She reports she has had a cold for the last couple of days and has been coughing.     Physical Exam   Triage Vital Signs: ED Triage Vitals  Encounter Vitals Group     BP 11/29/23 1150 (!) 124/95     Girls Systolic BP Percentile --      Girls Diastolic BP Percentile --      Boys Systolic BP Percentile --      Boys Diastolic BP Percentile --      Pulse Rate 11/29/23 1150 90     Resp 11/29/23 1150 20     Temp 11/29/23 1150 100.1 F (37.8 C)     Temp src --      SpO2 11/29/23 1150 90 %     Weight 11/29/23 1144 131.5 kg (290 lb)     Height 11/29/23 1144 1.651 m (5' 5)     Head Circumference --      Peak Flow --      Pain Score 11/29/23 1144 0     Pain Loc --      Pain Education --      Exclude from Growth Chart --     Most recent vital signs: Vitals:   11/29/23 1150  BP: (!) 124/95  Pulse: 90  Resp: 20  Temp: 100.1 F (37.8 C)  SpO2: 90%     General: Awake, no distress.  CV:  Good peripheral perfusion.  Resp:  Normal effort.  Clear to auscultation bilaterally Abd:  No distention.  Other:  Normal strength in the lower extremities   ED Results / Procedures / Treatments   Labs (all labs ordered are listed, but only abnormal results are displayed) Labs Reviewed  COMPREHENSIVE METABOLIC PANEL WITH GFR - Abnormal; Notable for the following components:      Result Value   Creatinine, Ser 1.08 (*)    Albumin 3.1 (*)    AST 13 (*)    GFR, Estimated 56 (*)    All other components within normal limits  CBC -  Abnormal; Notable for the following components:   RBC 5.89 (*)    Hemoglobin 16.2 (*)    HCT 50.2 (*)    All other components within normal limits  RESP PANEL BY RT-PCR (RSV, FLU A&B, COVID)  [id number]  LACTIC ACID, PLASMA  URINALYSIS, ROUTINE W REFLEX MICROSCOPIC  CBG MONITORING, ED     EKG  ED ECG REPORT I, Lamar Price, the attending physician, personally viewed and interpreted this ECG.  Date: 11/29/2023  Rhythm: normal sinus rhythm QRS Axis: normal Intervals: Left bundle branch block ST/T Wave abnormalities: normal Narrative Interpretation: no evidence of acute ischemia    RADIOLOGY Chest x-ray viewed interpreted by me with possible right sided airspace disease pending CT    PROCEDURES:  Critical Care performed:   Procedures   MEDICATIONS ORDERED IN ED: Medications - No data to display   IMPRESSION / MDM / ASSESSMENT AND PLAN / ED COURSE  I reviewed  the triage vital signs and the nursing notes. Patient's presentation is most consistent with acute presentation with potential threat to life or bodily function.  Patient presents with weakness as detailed above, differential includes viral or bacterial infection, pneumonia, electrolyte abnormality  Normal strength in all extremities, not consistent with CVA.  Lab work thus far is reassuring, COVID test is negative, chest x-ray however concerning for possible pneumonia, will send for CT to evaluate further given question of possible hilar lymphadenopathy  CT scan suspicious for developing pneumonia/bronchitis , Discussed need for 52-month follow-up to document improvement in lymphadenopathy and other incidental findings discussed  No indication for admission at this time, appropriate for discharge with antibiotics.     FINAL CLINICAL IMPRESSION(S) / ED DIAGNOSES   Final diagnoses:  Community acquired pneumonia, unspecified laterality     Rx / DC Orders   ED Discharge Orders          Ordered     cephALEXin  (KEFLEX ) 500 MG capsule  3 times daily        11/29/23 1453    azithromycin  (ZITHROMAX  Z-PAK) 250 MG tablet        11/29/23 1453             Note:  This document was prepared using Dragon voice recognition software and may include unintentional dictation errors.   Arlander Charleston, MD 11/29/23 [phone]

## 2023-11-29 NOTE — ED Triage Notes (Signed)
 Pt comes via [facility] from home with c/o weakness and fall. Pt states she was trying to get out of bed and slid out hitting her knees. Pt states no pain right now. Pt denies any loc or hitting head.   Pt has junk cough present and strong odor.

## 2023-11-29 NOTE — ED Triage Notes (Signed)
 ARrives via [facility] from home.  C?O fall today.  New onset weakness.  Currently has a cold. Takes Eliquis .  No head trauma. No LOC.  Denies pain.  Hx Afib, DM.  VS wnl.

## 2023-12-02 ENCOUNTER — Ambulatory Visit: Admitting: Family

## 2023-12-07 ENCOUNTER — Other Ambulatory Visit: Payer: Self-pay | Admitting: Family

## 2023-12-13 ENCOUNTER — Telehealth: Payer: Self-pay

## 2023-12-13 NOTE — Progress Notes (Signed)
   12/13/2023  Patient ID: Chrissie CHRISTELLA Blush, female   DOB: 1955-04-01, 69 y.o.   MRN: [id number]  Pharmacy Quality Measure Review  This patient is appearing on a report for being at risk of failing the adherence measure for statin medications this calendar year.   Medication: Rosuvatatin Last fill date: 08/09/23 for 90ds  Attempted to contact patient. Was told I had the wrong number by the person who picked up. Dialed [phone]. No mychart active to send message  Jon VEAR Lindau, PharmD Clinical Pharmacist [phone]

## 2023-12-20 ENCOUNTER — Other Ambulatory Visit: Payer: Self-pay | Admitting: Family

## 2023-12-24 NOTE — Progress Notes (Signed)
   12/24/2023  Patient ID: Barbara Spencer, female   DOB: 1955-09-23, 68 y.o.   MRN: [id number]  Pharmacy Quality Measure Review  This patient is appearing on a report for being at risk of failing the adherence measure for cholesterol (statin) and hypertension (ACEi/ARB) medications this calendar year.   Medication: Losartan  Last fill date: 12/21/23 for 90 day supply  Medication: Rosuvastatin Last fill date: 12/18/23 for 90 day supply  Insurance report was not up to date. No action needed at this time.   Jon VEAR Lindau, PharmD Clinical Pharmacist [phone]

## 2023-12-25 DIAGNOSIS — F1120 Opioid dependence, uncomplicated: Secondary | ICD-10-CM | POA: Diagnosis not present

## 2023-12-25 DIAGNOSIS — F419 Anxiety disorder, unspecified: Secondary | ICD-10-CM | POA: Diagnosis not present

## 2023-12-25 DIAGNOSIS — B0223 Postherpetic polyneuropathy: Secondary | ICD-10-CM | POA: Diagnosis not present

## 2023-12-25 DIAGNOSIS — F319 Bipolar disorder, unspecified: Secondary | ICD-10-CM | POA: Diagnosis not present

## 2023-12-25 DIAGNOSIS — B0229 Other postherpetic nervous system involvement: Secondary | ICD-10-CM | POA: Diagnosis not present

## 2023-12-25 DIAGNOSIS — K5903 Drug induced constipation: Secondary | ICD-10-CM | POA: Diagnosis not present

## 2023-12-25 DIAGNOSIS — T503X5A Adverse effect of electrolytic, caloric and water-balance agents, initial encounter: Secondary | ICD-10-CM | POA: Diagnosis not present

## 2023-12-25 DIAGNOSIS — G894 Chronic pain syndrome: Secondary | ICD-10-CM | POA: Diagnosis not present

## 2023-12-25 DIAGNOSIS — M545 Low back pain, unspecified: Secondary | ICD-10-CM | POA: Diagnosis not present

## 2023-12-25 DIAGNOSIS — M5441 Lumbago with sciatica, right side: Secondary | ICD-10-CM | POA: Diagnosis not present

## 2023-12-25 DIAGNOSIS — E669 Obesity, unspecified: Secondary | ICD-10-CM | POA: Diagnosis not present

## 2023-12-25 DIAGNOSIS — Z79891 Long term (current) use of opiate analgesic: Secondary | ICD-10-CM | POA: Diagnosis not present

## 2024-01-04 DIAGNOSIS — G4733 Obstructive sleep apnea (adult) (pediatric): Secondary | ICD-10-CM | POA: Diagnosis not present

## 2024-01-12 ENCOUNTER — Other Ambulatory Visit: Payer: Self-pay | Admitting: Family

## 2024-01-29 ENCOUNTER — Telehealth: Payer: Self-pay | Admitting: Family

## 2024-01-29 NOTE — Telephone Encounter (Signed)
 Patient left VM wanting to discuss her sugar. Per Alan verbal, patient needs an appointment since she hasn't been in since March 2025.

## 2024-02-05 DIAGNOSIS — F1120 Opioid dependence, uncomplicated: Secondary | ICD-10-CM | POA: Diagnosis not present

## 2024-02-05 DIAGNOSIS — Z79891 Long term (current) use of opiate analgesic: Secondary | ICD-10-CM | POA: Diagnosis not present

## 2024-02-11 ENCOUNTER — Other Ambulatory Visit

## 2024-02-11 ENCOUNTER — Encounter: Payer: Self-pay | Admitting: Family

## 2024-02-11 ENCOUNTER — Ambulatory Visit (INDEPENDENT_AMBULATORY_CARE_PROVIDER_SITE_OTHER): Admitting: Family

## 2024-02-11 VITALS — BP 132/92 | HR 69 | Ht 62.0 in | Wt 290.0 lb

## 2024-02-11 DIAGNOSIS — I421 Obstructive hypertrophic cardiomyopathy: Secondary | ICD-10-CM

## 2024-02-11 DIAGNOSIS — Z6841 Body Mass Index (BMI) 40.0 and over, adult: Secondary | ICD-10-CM

## 2024-02-11 DIAGNOSIS — Z1159 Encounter for screening for other viral diseases: Secondary | ICD-10-CM | POA: Diagnosis not present

## 2024-02-11 DIAGNOSIS — E1142 Type 2 diabetes mellitus with diabetic polyneuropathy: Secondary | ICD-10-CM

## 2024-02-11 DIAGNOSIS — I480 Paroxysmal atrial fibrillation: Secondary | ICD-10-CM | POA: Diagnosis not present

## 2024-02-11 DIAGNOSIS — Z23 Encounter for immunization: Secondary | ICD-10-CM | POA: Diagnosis not present

## 2024-02-11 DIAGNOSIS — E559 Vitamin D deficiency, unspecified: Secondary | ICD-10-CM

## 2024-02-11 DIAGNOSIS — I10 Essential (primary) hypertension: Secondary | ICD-10-CM

## 2024-02-11 DIAGNOSIS — Z794 Long term (current) use of insulin: Secondary | ICD-10-CM | POA: Diagnosis not present

## 2024-02-11 DIAGNOSIS — E538 Deficiency of other specified B group vitamins: Secondary | ICD-10-CM | POA: Diagnosis not present

## 2024-02-11 DIAGNOSIS — E782 Mixed hyperlipidemia: Secondary | ICD-10-CM

## 2024-02-11 LAB — POCT CBG (FASTING - GLUCOSE)-MANUAL ENTRY: Glucose Fasting, POC: 155 mg/dL — AB (ref 70–99)

## 2024-02-11 MED ORDER — LOSARTAN POTASSIUM 100 MG PO TABS: 100.0000 mg | ORAL_TABLET | Freq: Every day | ORAL | 2 refills | Status: AC

## 2024-02-11 MED ORDER — LOSARTAN POTASSIUM 100 MG PO TABS: 100.0000 mg | ORAL_TABLET | Freq: Every day | ORAL | 2 refills | Status: DC

## 2024-02-11 NOTE — Assessment & Plan Note (Signed)
Patient is seen by cardiology, who manage this condition.  She is well controlled with current therapy.   Will defer to them for further changes to plan of care.

## 2024-02-11 NOTE — Progress Notes (Signed)
 Established Patient Office Visit  Subjective:  Patient ID: Barbara Spencer, female    DOB: Aug 23, 1955  Age: 68 y.o. MRN: [id number]  Chief Complaint  Patient presents with   Hypoglycemia    Patient is here today for her 6 months follow up.  She has been feeling fairly well since last appointment.   She does have additional concerns to discuss today.   Hypoglycemia This is a new problem. The current episode started in the past 7 days. The problem occurs intermittently. The problem has been waxing and waning. She has tried eating for the symptoms. The treatment provided mild relief.   Labs are due today.  She needs refills.   I have reviewed her active problem list, medication list, allergies, notes from last encounter, lab results for her appointment today.    No other concerns at this time.   Past Medical History:  Diagnosis Date   Anxiety    managing well with medication   Aortic stenosis    mild   Bipolar disorder (HCC)    DDD (degenerative disc disease), lumbar    back and hip   Diabetes mellitus, type II (HCC)    type 2   Gastric ulcer 12/02/2015   H/O: CVA (cerebrovascular accident) 08/03/2016   Heart murmur    HLD (hyperlipidemia) 08/03/2016   HTN (hypertension)    Hypothyroidism    Long term (current) use of insulin  (HCC) 11/04/2018   Obesity    Seasonal affective disorder    under control   Sleep apnea    wears cpap   Stroke (HCC)    x 2   Tachycardia    Thyroid  disease     Past Surgical History:  Procedure Laterality Date   APPENDECTOMY     CARDIAC CATHETERIZATION     x3 -all clear   CESAREAN SECTION     CHOLECYSTECTOMY     COLONOSCOPY WITH PROPOFOL  N/A 02/03/2015   Procedure: COLONOSCOPY WITH PROPOFOL ;  Surgeon: Lynwood Bohr, MD;  Location: [facility];  Service: Endoscopy;  Laterality: N/A;   COLONOSCOPY WITH PROPOFOL  N/A 03/01/2016   Procedure: COLONOSCOPY WITH PROPOFOL ;  Surgeon: Lynwood Bohr, MD;  Location: [facility];  Service:  Endoscopy;  Laterality: N/A;   ESOPHAGOGASTRODUODENOSCOPY (EGD) WITH PROPOFOL  N/A 12/02/2015   Procedure: ESOPHAGOGASTRODUODENOSCOPY (EGD) WITH PROPOFOL ;  Surgeon: Lynwood Bohr, MD;  Location: [facility];  Service: Endoscopy;  Laterality: N/A;   ESOPHAGOGASTRODUODENOSCOPY (EGD) WITH PROPOFOL  N/A 03/01/2016   Procedure: ESOPHAGOGASTRODUODENOSCOPY (EGD) WITH PROPOFOL ;  Surgeon: Lynwood Bohr, MD;  Location: [facility];  Service: Endoscopy;  Laterality: N/A;   EUS N/A 04/11/2016   Procedure: UPPER ENDOSCOPIC ULTRASOUND (EUS) RADIAL;  Surgeon: Elsie Cree, MD;  Location: [facility];  Service: Endoscopy;  Laterality: N/A;   HERNIA REPAIR     abdominal hernia repair after gallbladder surgery   LAPAROSCOPIC PARTIAL GASTRECTOMY N/A 12/18/2016   Procedure: LAPAROSCOPIC PARTIAL GASTRECTOMY WITH ROUX-EN-Y RECONSTRUCTION;  Surgeon: Mikell Katz, MD;  Location: [facility];  Service: General;  Laterality: N/A;   PARTIAL GASTRECTOMY     12/18/16 Dr. Mikell    Social History   Socioeconomic History   Marital status: Married    Spouse name: information systems manager (jim)   Number of children: 5   Years of education: Not on file   Highest education level: High school graduate  Occupational History   Not on file  Tobacco Use   Smoking status: Former    Current packs/day: 0.00    Types: Cigarettes    Quit  date: 09/02/2004    Years since quitting: 19.4   Smokeless tobacco: Never  Vaping Use   Vaping status: Never Used  Substance and Sexual Activity   Alcohol use: Not Currently    Alcohol/week: 0.0 standard drinks of alcohol    Comment: occasional   Drug use: No   Sexual activity: Yes    Birth control/protection: None  Other Topics Concern   Not on file  Social History Narrative   Not on file   Social Drivers of Health   Financial Resource Strain: Low Risk  (05/31/2023)   Overall Financial Resource Strain (CARDIA)    Difficulty of Paying Living Expenses: Not hard at all  Food Insecurity: No Food  Insecurity (06/23/2023)   Hunger Vital Sign    Worried About Running Out of Food in the Last Year: Never true    Ran Out of Food in the Last Year: Never true  Transportation Needs: No Transportation Needs (05/31/2023)   PRAPARE - Administrator, Civil Service (Medical): No    Lack of Transportation (Non-Medical): No  Physical Activity: Inactive (06/23/2023)   Exercise Vital Sign    Days of Exercise per Week: 0 days    Minutes of Exercise per Session: 0 min  Stress: No Stress Concern Present (05/31/2023)   [employer] of Occupational Health - Occupational Stress Questionnaire    Feeling of Stress : Not at all  Social Connections: Unknown (07/31/2018)   Social Connection and Isolation Panel    Frequency of Communication with Friends and Family: Not on file    Frequency of Social Gatherings with Friends and Family: Not on file    Attends Religious Services: 1 to 4 times per year    Active Member of [employer] or Organizations: No    Attends Banker Meetings: Never    Marital Status: Married  Catering Manager Violence: Not At Risk (06/23/2023)   Humiliation, Afraid, Rape, and Kick questionnaire    Fear of Current or Ex-Partner: No    Emotionally Abused: No    Physically Abused: No    Sexually Abused: No    Family History  Problem Relation Age of Onset   Heart disease Mother    Diabetes Mother    Hypertension Mother    Heart disease Father    Dementia Father    Alcohol abuse Father    Breast cancer Cousin     Allergies  Allergen Reactions   Latex Hives   Sulfa Antibiotics Itching and Swelling   Tape Rash and Other (See Comments)    Paper tape ok to use per pt-Tegaderm reddens skin and Electrodes ripped skin off and now have scars    Review of Systems  All other systems reviewed and are negative.      Objective:   BP (!) 132/92   Pulse 69   Ht 5' 2 (1.575 m)   Wt 290 lb (131.5 kg)   SpO2 94%   BMI 53.04 kg/m   Vitals:   02/11/24 1352   BP: (!) 132/92  Pulse: 69  Height: 5' 2 (1.575 m)  Weight: 290 lb (131.5 kg)  SpO2: 94%  BMI (Calculated): 53.03    Physical Exam Vitals and nursing note reviewed.  Constitutional:      Appearance: Normal appearance. She is normal weight.  HENT:     Head: Normocephalic.  Eyes:     Extraocular Movements: Extraocular movements intact.     Conjunctiva/sclera: Conjunctivae normal.     Pupils:  Pupils are equal, round, and reactive to light.  Cardiovascular:     Rate and Rhythm: Normal rate.  Pulmonary:     Effort: Pulmonary effort is normal.  Neurological:     General: No focal deficit present.     Mental Status: She is alert and oriented to person, place, and time. Mental status is at baseline.  Psychiatric:        Mood and Affect: Mood normal.        Behavior: Behavior normal.        Thought Content: Thought content normal.      Results for orders placed or performed in visit on 02/11/24  Microalbumin / creatinine urine ratio  Result Value Ref Range   Microalb Creat Ratio 182   Protein / creatinine ratio, urine  Result Value Ref Range   Creatinine, Urine 45    Albumin, U 81.8   POCT CBG (Fasting - Glucose)  Result Value Ref Range   Glucose Fasting, POC 155 (A) 70 - 99 mg/dL    Recent Results (from the past 2160 hours)  Comprehensive metabolic panel     Status: Abnormal   Collection Time: 11/29/23 11:45 AM  Result Value Ref Range   Sodium 140 135 - 145 mmol/L   Potassium 3.8 3.5 - 5.1 mmol/L   Chloride 103 98 - 111 mmol/L   CO2 27 22 - 32 mmol/L   Glucose, Bld 84 70 - 99 mg/dL    Comment: Glucose reference range applies only to samples taken after fasting for at least 8 hours.   BUN 14 8 - 23 mg/dL   Creatinine, Ser 8.91 (H) 0.44 - 1.00 mg/dL   Calcium 8.9 8.9 - 89.6 mg/dL   Total Protein 6.8 6.5 - 8.1 g/dL   Albumin 3.1 (L) 3.5 - 5.0 g/dL   AST 13 (L) 15 - 41 U/L   ALT 12 0 - 44 U/L   Alkaline Phosphatase 48 38 - 126 U/L   Total Bilirubin 1.1 0.0 - 1.2  mg/dL   GFR, Estimated 56 (L) >60 mL/min    Comment: (NOTE) Calculated using the CKD-EPI Creatinine Equation (2021)    Anion gap 10 5 - 15    Comment: Performed at [facility], [address] [place], [place] [address]  CBC     Status: Abnormal   Collection Time: 11/29/23 11:45 AM  Result Value Ref Range   WBC 9.2 4.0 - 10.5 K/uL   RBC 5.89 (H) 3.87 - 5.11 MIL/uL   Hemoglobin 16.2 (H) 12.0 - 15.0 g/dL   HCT 49.7 (H) 63.9 - 53.9 %   MCV 85.2 80.0 - 100.0 fL   MCH 27.5 26.0 - 34.0 pg   MCHC 32.3 30.0 - 36.0 g/dL   RDW 85.8 88.4 - 84.4 %   Platelets 265 150 - 400 K/uL   nRBC 0.0 0.0 - 0.2 %    Comment: Performed at [facility], [address] [place], [place] [address]  Lactic acid, plasma     Status: None   Collection Time: 11/29/23 11:51 AM  Result Value Ref Range   Lactic Acid, Venous 1.2 0.5 - 1.9 mmol/L    Comment: Performed at [facility], [address] [place], [place] [address]  Resp panel by RT-PCR (RSV, Flu A&B, Covid) Anterior Nasal Swab     Status: None   Collection Time: 11/29/23 11:51 AM   Specimen: Anterior Nasal Swab  Result Value Ref Range   SARS Coronavirus  2 by RT PCR NEGATIVE NEGATIVE    Comment: (NOTE) SARS-CoV-2 target nucleic acids are NOT DETECTED.  The SARS-CoV-2 RNA is generally detectable in upper respiratory specimens during the acute phase of infection. The lowest concentration of SARS-CoV-2 viral copies this assay can detect is 138 copies/mL. A negative result does not preclude SARS-Cov-2 infection and should not be used as the sole basis for treatment or other patient management decisions. A negative result may occur with  improper specimen collection/handling, submission of specimen other than nasopharyngeal swab, presence of viral mutation(s) within the areas targeted by this assay, and inadequate number of viral copies(<138 copies/mL). A negative result must be combined with clinical observations, patient  history, and epidemiological information. The expected result is Negative.  Fact Sheet for Patients:  bloggercourse.com  Fact Sheet for Healthcare Providers:  seriousbroker.it  This test is no t yet approved or cleared by the [place]  FDA and  has been authorized for detection and/or diagnosis of SARS-CoV-2 by FDA under an Emergency Use Authorization (EUA). This EUA will remain  in effect (meaning this test can be used) for the duration of the COVID-19 declaration under Section 564(b)(1) of the Act, 21 U.S.C.section 360bbb-3(b)(1), unless the authorization is terminated  or revoked sooner.       Influenza A by PCR NEGATIVE NEGATIVE   Influenza B by PCR NEGATIVE NEGATIVE    Comment: (NOTE) The Xpert Xpress SARS-CoV-2/FLU/RSV plus assay is intended as an aid in the diagnosis of influenza from Nasopharyngeal swab specimens and should not be used as a sole basis for treatment. Nasal washings and aspirates are unacceptable for Xpert Xpress SARS-CoV-2/FLU/RSV testing.  Fact Sheet for Patients: bloggercourse.com  Fact Sheet for Healthcare Providers: seriousbroker.it  This test is not yet approved or cleared by the [place]  FDA and has been authorized for detection and/or diagnosis of SARS-CoV-2 by FDA under an Emergency Use Authorization (EUA). This EUA will remain in effect (meaning this test can be used) for the duration of the COVID-19 declaration under Section 564(b)(1) of the Act, 21 U.S.C. section 360bbb-3(b)(1), unless the authorization is terminated or revoked.     Resp Syncytial Virus by PCR NEGATIVE NEGATIVE    Comment: (NOTE) Fact Sheet for Patients: bloggercourse.com  Fact Sheet for Healthcare Providers: seriousbroker.it  This test is not yet approved or cleared by the [place]  FDA and has been  authorized for detection and/or diagnosis of SARS-CoV-2 by FDA under an Emergency Use Authorization (EUA). This EUA will remain in effect (meaning this test can be used) for the duration of the COVID-19 declaration under Section 564(b)(1) of the Act, 21 U.S.C. section 360bbb-3(b)(1), unless the authorization is terminated or revoked.  Performed at [facility], [address] [place], [place] [address]   POCT CBG (Fasting - Glucose)     Status: Abnormal   Collection Time: 02/11/24  1:59 PM  Result Value Ref Range   Glucose Fasting, POC 155 (A) 70 - 99 mg/dL       Assessment & Plan Type 2 diabetes mellitus with diabetic polyneuropathy, with long-term current use of insulin  (HCC) Patient to decrease insulin  to 55 units nightly. Pharmacist Jon will check in with patient next week on Tuesday to evaluate her blood sugars further. Will adjust again as needed based on result at that time.  Needs flu shot Flu vaccine given in office today.  Patient encouraged to manage symptoms as needed with supportive measures.  Paroxysmal atrial fibrillation (HCC) Obstructive hypertrophic cardiomyopathy [facility]) Patient  is seen by cardiology, who manage this condition.  She is well controlled with current therapy.   Will defer to them for further changes to plan of care.  Morbid obesity with BMI of 50.0-59.9, adult (HCC) Continue current meds.  Will adjust as needed based on results.  The patient is asked to make an attempt to improve diet and exercise patterns to aid in medical management of this problem. Addressed importance of increasing and maintaining water  intake.   Mixed hyperlipidemia Checking labs today.  Continue current therapy for lipid control. Will modify as needed based on labwork results.   -CMP w/eGFR -Lipid Panel  Vitamin D  deficiency, unspecified B12 deficiency due to diet Checking labs today.  Will continue supplements as needed.   - Vitamin D  - Vitamin  B12 - TSH   Need for hepatitis C screening test Test ordered in office today. Will call with results.  Essential hypertension Increase Losartan  to 100mg .  Take BP at home.   Jon will check in during her appointment next week as well.     Return in about 6 months (around 08/10/2024).   Total time spent: 30 minutes  ALAN CHRISTELLA ARRANT, FNP  02/11/2024   This document may have been prepared by [facility] Voice Recognition software and as such may include unintentional dictation errors.

## 2024-02-11 NOTE — Assessment & Plan Note (Signed)
 Patient to decrease insulin  to 55 units nightly. Pharmacist Jon will check in with patient next week on Tuesday to evaluate her blood sugars further. Will adjust again as needed based on result at that time.

## 2024-02-11 NOTE — Assessment & Plan Note (Signed)
 Increase Losartan  to 100mg .  Take BP at home.   Jon will check in during her appointment next week as well.

## 2024-02-11 NOTE — Assessment & Plan Note (Signed)
 Continue current meds.  Will adjust as needed based on results.  The patient is asked to make an attempt to improve diet and exercise patterns to aid in medical management of this problem. Addressed importance of increasing and maintaining water  intake.

## 2024-02-11 NOTE — Assessment & Plan Note (Signed)
 Checking labs today.  Continue current therapy for lipid control. Will modify as needed based on labwork results.   -CMP w/eGFR -Lipid Panel

## 2024-02-12 LAB — VITAMIN D 25 HYDROXY (VIT D DEFICIENCY, FRACTURES): Vit D, 25-Hydroxy: 33.7 ng/mL (ref 30.0–100.0)

## 2024-02-12 LAB — CMP14+EGFR
ALT: 12 IU/L (ref 0–32)
AST: 17 IU/L (ref 0–40)
Albumin: 3.8 g/dL — ABNORMAL LOW (ref 3.9–4.9)
Alkaline Phosphatase: 64 IU/L (ref 49–135)
BUN/Creatinine Ratio: 15 (ref 12–28)
BUN: 15 mg/dL (ref 8–27)
Bilirubin Total: 0.4 mg/dL (ref 0.0–1.2)
CO2: 23 mmol/L (ref 20–29)
Calcium: 10.4 mg/dL — ABNORMAL HIGH (ref 8.7–10.3)
Chloride: 103 mmol/L (ref 96–106)
Creatinine, Ser: 1.03 mg/dL — ABNORMAL HIGH (ref 0.57–1.00)
Globulin, Total: 2.7 g/dL (ref 1.5–4.5)
Glucose: 147 mg/dL — ABNORMAL HIGH (ref 70–99)
Potassium: 4.7 mmol/L (ref 3.5–5.2)
Sodium: 141 mmol/L (ref 134–144)
Total Protein: 6.5 g/dL (ref 6.0–8.5)
eGFR: 59 mL/min/1.73 — ABNORMAL LOW (ref >59)

## 2024-02-12 LAB — CBC WITH DIFFERENTIAL/PLATELET
Basophils Absolute: 0.1 x10E3/uL (ref 0.0–0.2)
Basos: 1 %
EOS (ABSOLUTE): 0.2 x10E3/uL (ref 0.0–0.4)
Eos: 3 %
Hematocrit: 54 % — ABNORMAL HIGH (ref 34.0–46.6)
Hemoglobin: 17.5 g/dL — ABNORMAL HIGH (ref 11.1–15.9)
Immature Grans (Abs): 0 x10E3/uL (ref 0.0–0.1)
Immature Granulocytes: 0 %
Lymphocytes Absolute: 2 x10E3/uL (ref 0.7–3.1)
Lymphs: 26 %
MCH: 27.9 pg (ref 26.6–33.0)
MCHC: 32.4 g/dL (ref 31.5–35.7)
MCV: 86 fL (ref 79–97)
Monocytes Absolute: 0.5 x10E3/uL (ref 0.1–0.9)
Monocytes: 7 %
Neutrophils Absolute: 4.9 x10E3/uL (ref 1.4–7.0)
Neutrophils: 63 %
Platelets: 211 x10E3/uL (ref 150–450)
RBC: 6.27 x10E6/uL — ABNORMAL HIGH (ref 3.77–5.28)
RDW: 13.9 % (ref 11.7–15.4)
WBC: 7.7 x10E3/uL (ref 3.4–10.8)

## 2024-02-12 LAB — LIPID PANEL
Chol/HDL Ratio: 4.1 ratio (ref 0.0–4.4)
Cholesterol, Total: 146 mg/dL (ref 100–199)
HDL: 36 mg/dL — ABNORMAL LOW (ref >39)
LDL Chol Calc (NIH): 85 mg/dL (ref 0–99)
Triglycerides: 138 mg/dL (ref 0–149)
VLDL Cholesterol Cal: 25 mg/dL (ref 5–40)

## 2024-02-12 LAB — VITAMIN B12: Vitamin B-12: 441 pg/mL (ref 232–1245)

## 2024-02-12 LAB — HCV AB W REFLEX TO QUANT PCR: HCV Ab: NONREACTIVE

## 2024-02-12 LAB — HEMOGLOBIN A1C
Est. average glucose Bld gHb Est-mCnc: 166 mg/dL
Hgb A1c MFr Bld: 7.4 % — ABNORMAL HIGH (ref 4.8–5.6)

## 2024-02-12 LAB — TSH: TSH: 2.12 u[IU]/mL (ref 0.450–4.500)

## 2024-02-12 LAB — HCV INTERPRETATION

## 2024-02-18 ENCOUNTER — Telehealth: Payer: Self-pay

## 2024-02-18 ENCOUNTER — Other Ambulatory Visit: Payer: Self-pay

## 2024-02-18 NOTE — Progress Notes (Signed)
   02/18/2024  Patient ID: Barbara Spencer, female   DOB: [birth year], 68 y.o.   MRN: [id number]  Attempted to contact patient for scheduled appointment for medication management. Dialed primary number on file [phone] and was told I had the wrong number. Contacted alternate number for Mr Damico and had to leave a voicemail.  Jon VEAR Lindau, PharmD Clinical Pharmacist [phone]

## 2024-03-17 ENCOUNTER — Other Ambulatory Visit: Payer: Self-pay | Admitting: Family

## 2024-03-19 ENCOUNTER — Other Ambulatory Visit: Payer: Self-pay | Admitting: Cardiovascular Disease

## 2024-03-25 NOTE — Progress Notes (Signed)
 KAICEE SCARPINO                                          MRN: [id number]   03/25/2024   The [facility] Quality Team Specialist reviewed this patient medical record for the purposes of chart review for care gap closure. The following were reviewed: chart review for care gap closure-controlling blood pressure.   ZIOMARA BIRENBAUM                                          MRN: [id number]   03/27/2024   The [facility] Quality Team Specialist reviewed this patient medical record for the purposes of chart review for care gap closure. The following were reviewed: abstraction for care gap closure-kidney health evaluation for diabetes:eGFR  and uACR.    [facility] Quality Team

## 2024-04-01 ENCOUNTER — Other Ambulatory Visit: Payer: Self-pay | Admitting: Family

## 2024-04-30 ENCOUNTER — Ambulatory Visit: Payer: Self-pay

## 2024-04-30 ENCOUNTER — Other Ambulatory Visit: Payer: Self-pay | Admitting: Family

## 2024-08-10 ENCOUNTER — Ambulatory Visit: Admitting: Family
